# Patient Record
Sex: Female | Born: 2000 | Race: White | Hispanic: No | Marital: Single | Smoking: Current some day smoker
Health system: Southern US, Community
[De-identification: ages and names within clinical notes are randomized; demographics above are authoritative.]

## PROBLEM LIST (undated history)

## (undated) DIAGNOSIS — F419 Anxiety disorder, unspecified: Secondary | ICD-10-CM

## (undated) DIAGNOSIS — J45909 Unspecified asthma, uncomplicated: Secondary | ICD-10-CM

## (undated) DIAGNOSIS — I639 Cerebral infarction, unspecified: Secondary | ICD-10-CM

## (undated) HISTORY — DX: Cerebral infarction, unspecified: I63.9

---
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
## 2021-04-11 ENCOUNTER — Emergency Department (HOSPITAL_BASED_OUTPATIENT_CLINIC_OR_DEPARTMENT_OTHER): Payer: BC Managed Care – PPO

## 2021-04-11 ENCOUNTER — Encounter (HOSPITAL_BASED_OUTPATIENT_CLINIC_OR_DEPARTMENT_OTHER): Payer: Self-pay | Admitting: Emergency Medicine

## 2021-04-11 ENCOUNTER — Other Ambulatory Visit: Payer: Self-pay

## 2021-04-11 ENCOUNTER — Inpatient Hospital Stay (HOSPITAL_BASED_OUTPATIENT_CLINIC_OR_DEPARTMENT_OTHER)
Admission: EM | Admit: 2021-04-11 | Discharge: 2021-04-14 | DRG: 065 | Disposition: A | Discharge status: Home / Self Care | Payer: BC Managed Care – PPO | Attending: Internal Medicine | Admitting: Internal Medicine

## 2021-04-11 DIAGNOSIS — I639 Cerebral infarction, unspecified: Secondary | ICD-10-CM | POA: Diagnosis present

## 2021-04-11 DIAGNOSIS — Z793 Long term (current) use of hormonal contraceptives: Secondary | ICD-10-CM | POA: Diagnosis not present

## 2021-04-11 DIAGNOSIS — Z8673 Personal history of transient ischemic attack (TIA), and cerebral infarction without residual deficits: Secondary | ICD-10-CM

## 2021-04-11 DIAGNOSIS — R471 Dysarthria and anarthria: Secondary | ICD-10-CM | POA: Diagnosis present

## 2021-04-11 DIAGNOSIS — R413 Other amnesia: Secondary | ICD-10-CM

## 2021-04-11 DIAGNOSIS — J302 Other seasonal allergic rhinitis: Secondary | ICD-10-CM | POA: Diagnosis not present

## 2021-04-11 DIAGNOSIS — Z20822 Contact with and (suspected) exposure to covid-19: Secondary | ICD-10-CM | POA: Diagnosis not present

## 2021-04-11 DIAGNOSIS — E538 Deficiency of other specified B group vitamins: Secondary | ICD-10-CM | POA: Diagnosis present

## 2021-04-11 DIAGNOSIS — I6340 Cerebral infarction due to embolism of unspecified cerebral artery: Secondary | ICD-10-CM | POA: Diagnosis not present

## 2021-04-11 DIAGNOSIS — Y838 Other surgical procedures as the cause of abnormal reaction of the patient, or of later complication, without mention of misadventure at the time of the procedure: Secondary | ICD-10-CM | POA: Diagnosis not present

## 2021-04-11 DIAGNOSIS — R29701 NIHSS score 1: Secondary | ICD-10-CM | POA: Diagnosis not present

## 2021-04-11 DIAGNOSIS — J4520 Mild intermittent asthma, uncomplicated: Secondary | ICD-10-CM | POA: Diagnosis present

## 2021-04-11 DIAGNOSIS — Q278 Other specified congenital malformations of peripheral vascular system: Secondary | ICD-10-CM

## 2021-04-11 DIAGNOSIS — N926 Irregular menstruation, unspecified: Secondary | ICD-10-CM | POA: Diagnosis present

## 2021-04-11 DIAGNOSIS — Z716 Tobacco abuse counseling: Secondary | ICD-10-CM | POA: Diagnosis not present

## 2021-04-11 DIAGNOSIS — L7632 Postprocedural hematoma of skin and subcutaneous tissue following other procedure: Secondary | ICD-10-CM | POA: Diagnosis not present

## 2021-04-11 DIAGNOSIS — Z8616 Personal history of COVID-19: Secondary | ICD-10-CM | POA: Diagnosis not present

## 2021-04-11 DIAGNOSIS — I10 Essential (primary) hypertension: Secondary | ICD-10-CM | POA: Diagnosis present

## 2021-04-11 DIAGNOSIS — F17210 Nicotine dependence, cigarettes, uncomplicated: Secondary | ICD-10-CM | POA: Diagnosis not present

## 2021-04-11 DIAGNOSIS — R4701 Aphasia: Secondary | ICD-10-CM | POA: Diagnosis present

## 2021-04-11 DIAGNOSIS — F1290 Cannabis use, unspecified, uncomplicated: Secondary | ICD-10-CM | POA: Diagnosis not present

## 2021-04-11 DIAGNOSIS — F419 Anxiety disorder, unspecified: Secondary | ICD-10-CM | POA: Diagnosis present

## 2021-04-11 DIAGNOSIS — Z7151 Drug abuse counseling and surveillance of drug abuser: Secondary | ICD-10-CM

## 2021-04-11 DIAGNOSIS — E785 Hyperlipidemia, unspecified: Secondary | ICD-10-CM | POA: Diagnosis present

## 2021-04-11 DIAGNOSIS — R479 Unspecified speech disturbances: Secondary | ICD-10-CM

## 2021-04-11 HISTORY — DX: Anxiety disorder, unspecified: F41.9

## 2021-04-11 HISTORY — DX: Unspecified asthma, uncomplicated: J45.909

## 2021-04-11 LAB — PREGNANCY, URINE: Preg Test, Ur: NEGATIVE

## 2021-04-11 LAB — COMPREHENSIVE METABOLIC PANEL
ALT: 9 U/L (ref 0–44)
AST: 12 U/L — ABNORMAL LOW (ref 15–41)
Albumin: 4.4 g/dL (ref 3.5–5.0)
Alkaline Phosphatase: 45 U/L (ref 38–126)
Anion gap: 8 (ref 5–15)
BUN: 10 mg/dL (ref 6–20)
CO2: 24 mmol/L (ref 22–32)
Calcium: 9.7 mg/dL (ref 8.9–10.3)
Chloride: 105 mmol/L (ref 98–111)
Creatinine, Ser: 0.79 mg/dL (ref 0.44–1.00)
GFR, Estimated: >60 mL/min (ref >60)
Glucose, Bld: 87 mg/dL (ref 70–99)
Potassium: 4 mmol/L (ref 3.5–5.1)
Sodium: 137 mmol/L (ref 135–145)
Total Bilirubin: 0.4 mg/dL (ref 0.3–1.2)
Total Protein: 7.2 g/dL (ref 6.5–8.1)

## 2021-04-11 LAB — DIFFERENTIAL
Abs Immature Granulocytes: 0 10*3/uL (ref 0.00–0.07)
Basophils Absolute: 0 10*3/uL (ref 0.0–0.1)
Basophils Relative: 1 %
Eosinophils Absolute: 0.1 10*3/uL (ref 0.0–0.5)
Eosinophils Relative: 2 %
Immature Granulocytes: 0 %
Lymphocytes Relative: 37 %
Lymphs Abs: 1.8 10*3/uL (ref 0.7–4.0)
Monocytes Absolute: 0.5 10*3/uL (ref 0.1–1.0)
Monocytes Relative: 10 %
Neutro Abs: 2.4 10*3/uL (ref 1.7–7.7)
Neutrophils Relative %: 50 %

## 2021-04-11 LAB — CBC
HCT: 38.7 % (ref 36.0–46.0)
Hemoglobin: 13 g/dL (ref 12.0–15.0)
MCH: 27.5 pg (ref 26.0–34.0)
MCHC: 33.6 g/dL (ref 30.0–36.0)
MCV: 81.8 fL (ref 80.0–100.0)
Platelets: 266 10*3/uL (ref 150–400)
RBC: 4.73 MIL/uL (ref 3.87–5.11)
RDW: 12.2 % (ref 11.5–15.5)
WBC: 4.8 10*3/uL (ref 4.0–10.5)
nRBC: 0 % (ref 0.0–0.2)

## 2021-04-11 LAB — RAPID URINE DRUG SCREEN, HOSP PERFORMED
Amphetamines: NOT DETECTED
Barbiturates: NOT DETECTED
Benzodiazepines: NOT DETECTED
Cocaine: NOT DETECTED
Opiates: NOT DETECTED
Tetrahydrocannabinol: POSITIVE — AB

## 2021-04-11 LAB — URINALYSIS, ROUTINE W REFLEX MICROSCOPIC
Bilirubin Urine: NEGATIVE
Glucose, UA: NEGATIVE mg/dL
Ketones, ur: NEGATIVE mg/dL
Leukocytes,Ua: NEGATIVE
Nitrite: NEGATIVE
Protein, ur: NEGATIVE mg/dL
Specific Gravity, Urine: 1.009 (ref 1.005–1.030)
pH: 7.5 (ref 5.0–8.0)

## 2021-04-11 LAB — RESP PANEL BY RT-PCR (FLU A&B, COVID) ARPGX2
Influenza A by PCR: NEGATIVE
Influenza B by PCR: NEGATIVE
SARS Coronavirus 2 by RT PCR: NEGATIVE

## 2021-04-11 LAB — CBG MONITORING, ED: Glucose-Capillary: 89 mg/dL (ref 70–99)

## 2021-04-11 LAB — ETHANOL: Alcohol, Ethyl (B): <10 mg/dL (ref <10)

## 2021-04-11 LAB — PROTIME-INR
INR: 1.1 (ref 0.8–1.2)
Prothrombin Time: 14 seconds (ref 11.4–15.2)

## 2021-04-11 LAB — APTT: aPTT: 26 seconds (ref 24–36)

## 2021-04-11 MED ORDER — ACETAMINOPHEN 160 MG/5ML PO SOLN: 650.0000 mg | ORAL | Status: DC | PRN

## 2021-04-11 MED ORDER — SODIUM CHLORIDE 0.9 % IV SOLN: INTRAVENOUS | Status: DC

## 2021-04-11 MED ORDER — ACETAMINOPHEN 650 MG RE SUPP: 650.0000 mg | RECTAL | Status: DC | PRN

## 2021-04-11 MED ORDER — SODIUM CHLORIDE 0.9 % IV BOLUS
1000.0000 mL | Freq: Once | INTRAVENOUS | Status: AC
  Administered 2021-04-11: 1000 mL via INTRAVENOUS

## 2021-04-11 MED ORDER — ACETAMINOPHEN 325 MG PO TABS
650.0000 mg | ORAL_TABLET | ORAL | Status: DC | PRN
  Administered 2021-04-11 – 2021-04-13 (×2): 650 mg via ORAL
  Filled 2021-04-11 (×2): qty 2

## 2021-04-11 MED ORDER — STROKE: EARLY STAGES OF RECOVERY BOOK
Freq: Once | Status: AC
  Filled 2021-04-11: qty 1

## 2021-04-11 NOTE — ED Provider Notes (Addendum)
[facility] EMERGENCY DEPT Provider Note   CSN: [id number] Arrival date & time: 04/11/21  1343     History Chief Complaint  Patient presents with  . Headache    Meghan Williamson is a 20 y.o. female.  HPI 20 year old female presents with trouble speaking and some headache.  History is from patient as well as grandparents.  Started having a headache around 1 AM on the left side of her head.  Is hard to tell based on the patient's difficulty with speaking how bad this was but it seems to be better.  She worked a shift last night at [employer] and it seems like she did not have any trouble speaking then.  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  Sister noticed she was having trouble talking.  Called her parents were out of town and they advised to give ibuprofen.  When she woke up this morning she still having difficulty talking.  Certain words seem to get caught and hard for her to say.  No fevers.  No significant past medical history.  History reviewed. No pertinent past medical history.  There are no problems to display for this patient.      OB History   No obstetric history on file.     History reviewed. No pertinent family history.     Home Medications Prior to Admission medications   Not on File    Allergies    Patient has no known allergies.  Review of Systems   Review of Systems  Constitutional: Negative for fever.  Gastrointestinal: Negative for vomiting.  Neurological: Positive for speech difficulty and headaches. Negative for weakness and numbness.  Psychiatric/Behavioral: The patient is nervous/anxious.   All other systems reviewed and are negative.   Physical Exam Updated Vital Signs BP (!) 156/97 (BP Location: Right Arm)   Pulse 63   Temp 98.4 F (36.9 C) (Oral)   Resp (!) 23   Ht 5\' 7"  (1.702 m)   Wt 58.1 kg   SpO2 100%   BMI 20.05 kg/m   Physical Exam Vitals and  nursing note reviewed.  Constitutional:      Appearance: She is well-developed.  HENT:     Head: Normocephalic and atraumatic.     Right Ear: External ear normal.     Left Ear: External ear normal.     Nose: Nose normal.  Eyes:     General:        Right eye: No discharge.        Left eye: No discharge.     Extraocular Movements: Extraocular movements intact.     Pupils: Pupils are equal, round, and reactive to light.  Cardiovascular:     Rate and Rhythm: Normal rate and regular rhythm.     Heart sounds: Normal heart sounds.  Pulmonary:     Effort: Pulmonary effort is normal.     Breath sounds: Normal breath sounds.  Abdominal:     General: There is no distension.     Palpations: Abdomen is soft.     Tenderness: There is no abdominal tenderness.  Musculoskeletal:     Cervical back: Neck supple. No rigidity.  Skin:    General: Skin is warm and dry.  Neurological:     Mental Status: She is alert.     Comments: CN 3-12 grossly intact. 5/5 strength in all 4 extremities. Grossly normal sensation. Normal finger to nose.  Speech is clear, but she has trouble when she talks where she will repeat a word or misplace a word. She is able to identify objects without difficulty. When asked where she is, she keeps stumbling over "[facility]" (presumably for [facility]) and eventually starts to get tearful  Psychiatric:        Mood and Affect: Mood is anxious (mildly anxious).     ED Results / Procedures / Treatments   Labs (all labs ordered are listed, but only abnormal results are displayed) Labs Reviewed  PROTIME-INR  APTT  CBC  DIFFERENTIAL  ETHANOL  COMPREHENSIVE METABOLIC PANEL  RAPID URINE DRUG SCREEN, HOSP PERFORMED  URINALYSIS, ROUTINE W REFLEX MICROSCOPIC  PREGNANCY, URINE  CBG MONITORING, ED    EKG EKG Interpretation  Date/Time:  Sunday Apr 11 2021 14:16:08 EDT Ventricular Rate:  62 PR Interval:  118 QRS Duration: 80 QT Interval:  367 QTC Calculation: 373 R  Axis:   65 Text Interpretation: Sinus rhythm Borderline short PR interval Probable left ventricular hypertrophy Borderline T abnormalities, inferior leads No old tracing to compare Confirmed by Pricilla Loveless [phone]) on 04/11/2021 2:17:52 PM   Radiology CT HEAD WO CONTRAST  Result Date: 04/11/2021 CLINICAL DATA:  Pt arrives to ED with c/o an expressive aphasic episode today starting at 1pm where she could not speak words. Pt states this episode may of lasted a few minutes. Pt reports she started a new job and it requires he to talk to a lot of new people. Pt does reports headache before the event and headache yesterday after twist her neck and hearing a "poping" noise EXAM: CT HEAD WITHOUT CONTRAST TECHNIQUE: Contiguous axial images were obtained from the base of the skull through the vertex without intravenous contrast. COMPARISON:  None. FINDINGS: Brain: No evidence of acute infarction, hemorrhage, hydrocephalus, extra-axial collection or mass lesion/mass effect. Vascular: No hyperdense vessel or unexpected calcification. Skull: Normal. Negative for fracture or focal lesion. Sinuses/Orbits: Visualized globes and orbits are unremarkable. Visualized sinuses are clear. Other: None. IMPRESSION: Normal unenhanced CT scan of the brain. Electronically Signed   By: Amie Portland M.D.   On: 04/11/2021 14:49    Procedures Procedures   Medications Ordered in ED Medications  sodium chloride 0.9 % bolus 1,000 mL (1,000 mLs Intravenous New Bag/Given 04/11/21 1443)    ED Course  I have reviewed the triage vital signs and the nursing notes.  Pertinent labs & imaging results that were available during my care of the patient were reviewed by me and considered in my medical decision making (see chart for details).    MDM Rules/Calculators/A&P                          Patient continues to have trouble speaking.  I do not think this is likely a stroke but so far no other obvious pathology has been found.   There could be an anxiety component/functional symptoms.  I discussed with Dr. Selina Cooley of neurology who recommends that she be transferred to [facility] for admission where she will get MRI brain, MRA head and neck and a full stroke work-up.  Discussed with parents and will admit for observation for this work-up.  She is still having difficulty speaking. At this point I don't have a strong suspicion for CNS infection and don't think LP is needed. Final Clinical Impression(s) / ED Diagnoses Final diagnoses:  Difficulty with speech    Rx / DC Orders ED Discharge Orders  None       Pricilla Loveless, MD 04/11/21 1523    Pricilla Loveless, MD 04/11/21 [phone]

## 2021-04-11 NOTE — ED Notes (Signed)
This RN attempted to call report at this time; no available RN at this time. This RN will re-attempt at a later time. Call Back Number left to [phone]

## 2021-04-11 NOTE — H&P (Signed)
History and Physical    Meghan Williamson ANV:[id number] DOB: 2000-12-09 DOA: 04/11/2021  PCP: Pcp, No   Patient coming from:  Home  Chief Complaint: difficulty speaking since last pm. Memory difficulties.   HPI: Meghan Williamson is a 20 y.o. female with medical history of intermittent seasonal allergies inducing asthma with no respiratory problems or symptoms. Presented to [facility] ER for evaluation of difficulty speaking and memory problems that began last pm. She states she was driving home from work around 1 AM when she developed a left-sided headache that she reports was throbbing but did not radiate.  She had no visual changes or auras with the headache.  She has no history of migraine headaches.  After she arrived home she had difficulty speaking.  She states that when she tried to talk to her sister she had difficulty finding the words to say and when she did speak the words were jumbled and did not make sense.  She tried texting her mother who was on vacation at the beach at the time and she could only type out jumbled letters that did not make coherent sense.  And has some memory lapses and difficulty remembering things.  Parents came home from the beach this morning.  She was taken to the emergency room for evaluation.  There is no report of any drooping face or difficulty walking or imbalance.  There is no report of any seizure-like activity or tremors and jerking of extremities.  He had no fever, nausea vomiting or diarrhea.  There is no history of seizures or migraine headaches in the family.  There is no known family history of clotting disorders. She is on oral contraceptives to regulate her menstrual period.  She has been under a lot of stress recently.  She just finished her first year of college at [employer] and returned home for the summer recently.  She is working at [employer], [employer], as a Psychologist, educational.  She has never had symptoms like this in the past. Does state that she  occasionally uses vaping products or smoke a cigarette.  She does report occasional marijuana use with her last use last night.  She denies any heroin, cocaine, LSD, PCP, ecstasy.  She denies alcohol use.  ED Course: In the emergency room patient been hemodynamically stable.  Labs are unremarkable.  CT of the head was negative.  Case was discussed with neurology by the ER physician and neurology recommended transfer to [facility] for stroke work-up with MRI of the brain, MRA of the head and neck and echocardiogram.  Review of Systems:  General: Denies weakness, fever, chills, weight loss, night sweats.  Denies dizziness.  Denies change in appetite HENT: Denies head trauma, headache, denies change in hearing, tinnitus. Denies nasal bleeding.  Denies sore throat, sores in mouth. Denies difficulty swallowing Eyes: Denies blurry vision, pain in eye, drainage.  Denies discoloration of eyes. Neck: Denies pain.  Denies swelling.  Denies pain with movement. Cardiovascular: Denies chest pain, palpitations. Denies edema.  Denies orthopnea Respiratory: Denies shortness of breath, cough. Denies wheezing.  Denies sputum production Gastrointestinal: Denies abdominal pain, swelling. Denies nausea, vomiting, diarrhea.  Denies melena.  Denies hematemesis. Musculoskeletal: Denies limitation of movement. Denies deformity or swelling. Denies pain. Denies arthralgias or myalgias. Genitourinary: Denies pelvic pain. Denies urinary frequency or hesitancy. Denies dysuria.  Skin: Denies rash.  Denies petechiae, purpura, ecchymosis. Neurological: Reports difficulty with speech and finding right words to day. Reports headache last night that is resolved.  Denies syncope.  Denies seizure activity. Denies weakness or paresthesia. Denies drooping face.  Denies visual change. Psychiatric: Denies depression, anxiety. Denies hallucinations.  Past Medical History:  Diagnosis Date  . Anxiety   . Asthma     History reviewed. No  pertinent surgical history.  Social History  reports that she has been smoking. She has never used smokeless tobacco. She reports current drug use. Drug: Marijuana. She reports that she does not drink alcohol.  No Known Allergies  History reviewed. No pertinent family history.   Prior to Admission medications   Not on File    Physical Exam: Vitals:   04/11/21 1445 04/11/21 1509 04/11/21 1917 04/11/21 2106  BP:  (!) 156/97 (!) 136/91 (!) 140/95  Pulse: 61 63 61 78  Resp: (!) 21 (!) 23 19 16   Temp:    98.8 F (37.1 C)  TempSrc:    Oral  SpO2: 100% 100% 100% 100%  Weight:      Height:        Constitutional: NAD, calm, comfortable Vitals:   04/11/21 1445 04/11/21 1509 04/11/21 1917 04/11/21 2106  BP:  (!) 156/97 (!) 136/91 (!) 140/95  Pulse: 61 63 61 78  Resp: (!) 21 (!) 23 19 16   Temp:    98.8 F (37.1 C)  TempSrc:    Oral  SpO2: 100% 100% 100% 100%  Weight:      Height:       General: WDWN, Alert and oriented x3.  Eyes: EOMI, PERRL, conjunctivae normal.  Sclera nonicteric HENT:  [place]/AT, external ears normal.  Nares patent without epistasis.  Mucous membranes are moist. Posterior pharynx clear of any exudate or lesions. Normal dentition.  Neck: Soft, normal range of motion, supple, no masses, no thyromegaly.  Trachea midline Respiratory: clear to auscultation bilaterally, no wheezing, no crackles. Normal respiratory effort. No accessory muscle use.  Cardiovascular: Regular rate and rhythm, no murmurs / rubs / gallops. No extremity edema. 2+ pedal pulses.   Abdomen: Soft, no tenderness, nondistended, no rebound or guarding.  No masses palpated. No hepatosplenomegaly. Bowel sounds normoactive Musculoskeletal: FROM. no cyanosis. No joint deformity upper and lower extremities. Normal muscle tone.  Skin: Warm, dry, intact no rashes, lesions, ulcers. No induration Neurologic: CN 2-12 grossly intact.  Normal speech.  Sensation intact, patella DTR +1 bilaterally. Strength 5/5 in  all extremities. No pronator drift. Normal Heel to shin bilaterally, normal finger to nose bilaterally.  Psychiatric: Normal judgment and insight.  Normal mood.    Labs on Admission: I have personally reviewed following labs and imaging studies  CBC: Recent Labs  Lab 04/11/21 1444  WBC 4.8  NEUTROABS 2.4  HGB 13.0  HCT 38.7  MCV 81.8  PLT 266    Basic Metabolic Panel: Recent Labs  Lab 04/11/21 1444  NA 137  K 4.0  CL 105  CO2 24  GLUCOSE 87  BUN 10  CREATININE 0.79  CALCIUM 9.7    GFR: Estimated Creatinine Clearance: 104.6 mL/min (by C-G formula based on SCr of 0.79 mg/dL).  Liver Function Tests: Recent Labs  Lab 04/11/21 1444  AST 12*  ALT 9  ALKPHOS 45  BILITOT 0.4  PROT 7.2  ALBUMIN 4.4    Urine analysis:    Component Value Date/Time   COLORURINE COLORLESS (A) 04/11/2021 1444   APPEARANCEUR CLEAR 04/11/2021 1444   LABSPEC 1.009 04/11/2021 1444   PHURINE 7.5 04/11/2021 1444   GLUCOSEU NEGATIVE 04/11/2021 1444   HGBUR MODERATE (A) 04/11/2021 1444  BILIRUBINUR NEGATIVE 04/11/2021 1444   KETONESUR NEGATIVE 04/11/2021 1444   PROTEINUR NEGATIVE 04/11/2021 1444   NITRITE NEGATIVE 04/11/2021 1444   LEUKOCYTESUR NEGATIVE 04/11/2021 1444    Radiological Exams on Admission: CT HEAD WO CONTRAST  Result Date: 04/11/2021 CLINICAL DATA:  Pt arrives to ED with c/o an expressive aphasic episode today starting at 1pm where she could not speak words. Pt states this episode may of lasted a few minutes. Pt reports she started a new job and it requires he to talk to a lot of new people. Pt does reports headache before the event and headache yesterday after twist her neck and hearing a "poping" noise EXAM: CT HEAD WITHOUT CONTRAST TECHNIQUE: Contiguous axial images were obtained from the base of the skull through the vertex without intravenous contrast. COMPARISON:  None. FINDINGS: Brain: No evidence of acute infarction, hemorrhage, hydrocephalus, extra-axial collection  or mass lesion/mass effect. Vascular: No hyperdense vessel or unexpected calcification. Skull: Normal. Negative for fracture or focal lesion. Sinuses/Orbits: Visualized globes and orbits are unremarkable. Visualized sinuses are clear. Other: None. IMPRESSION: Normal unenhanced CT scan of the brain. Electronically Signed   By: Amie Portland M.D.   On: 04/11/2021 14:49    EKG: Independently reviewed. EKG shows NSR with no acute ST elevation or depression QTc is 373  Assessment/Plan Principal Problem:   Dysarthria Ms. Stanczyk will be observed on medical telemetry floor for CVA.  Will obtain MRI of brain to rule out acute ischemic CVA. Obtain MRA head and neck per neurology recommendation.  Obtain echocardiogram to evaluate for PFO, wall motion and ejection fraction. Hypertension of 220/110 will be allowed for 24 hours per stroke protocol. Monitor BP Antiplatelet therapy with aspirin daily. Check lipid panel.  Neurochecks per stroke protocol  Active Problems:   Memory difficulties Obtain MRI. If symptoms persist or MRI positive will have neurology evaluate patient and possibly will need EEG to rule out petit mal seizure activity.     DVT prophylaxis: Padua score low. Early ambulation for DVT prophylaxis.  Code Status:   Full code  Family Communication:  Diagnosis and plan discussed with Ms. Devall and her mother who is at bedside. Questions answered. They agree with plan. Further recommendations to follow as clinically indicated. Disposition Plan:   Patient is from:  Home  Anticipated DC to:  home  Anticipated DC date:  Anticipate less than two midinight stay  Anticipated DC barriers:          No barriers to discharge identified at this time  Consults:  Case discussed with Neurology by ER physician who recommended stroke workup at [facility] Admission status:  Observation   Carlton Adam MD [facility]  How to contact the [facility] Attending or Consulting provider 7A - 7P or covering  provider during after hours 7P -7A, for this patient?   1. Check the care team in [facility] and look for a) attending/consulting TR[facility] provider listed and b) the [facility] team listed 2. Log into [email or web address] and use [facility] universal password to access. If you do not have the password, please contact the hospital operator. 3. Locate the [facility] provider you are looking for under [facility] and page to a number that you can be directly reached. 4. If you still have difficulty reaching the provider, please page the [facility] (Director on Call) for the Hospitalists listed on amion for assistance.  04/11/2021, 9:56 PM

## 2021-04-11 NOTE — ED Notes (Signed)
Patient transported to X-ray 

## 2021-04-11 NOTE — ED Notes (Signed)
[facility] at the Bedside; all Questions answered. Patient signed Transfer Consent Form Willingly. Report called by this RN to [place], RN

## 2021-04-11 NOTE — ED Triage Notes (Addendum)
Pt arrives to ED with c/o an expressive aphasic episode today starting at 1am where she could not speak words. Pt states this episode may of lasted a few minutes. Pt reports she started a new job and it requires her to talk to a lot of new people. Pt does reports headache before the event and headache yesterday after twist her neck and hearing a "poping" noise.

## 2021-04-12 ENCOUNTER — Observation Stay (HOSPITAL_COMMUNITY): Payer: BC Managed Care – PPO

## 2021-04-12 ENCOUNTER — Other Ambulatory Visit (HOSPITAL_COMMUNITY): Payer: PRIVATE HEALTH INSURANCE

## 2021-04-12 DIAGNOSIS — E785 Hyperlipidemia, unspecified: Secondary | ICD-10-CM | POA: Diagnosis present

## 2021-04-12 DIAGNOSIS — R471 Dysarthria and anarthria: Secondary | ICD-10-CM | POA: Diagnosis present

## 2021-04-12 DIAGNOSIS — F1290 Cannabis use, unspecified, uncomplicated: Secondary | ICD-10-CM | POA: Diagnosis present

## 2021-04-12 DIAGNOSIS — F17210 Nicotine dependence, cigarettes, uncomplicated: Secondary | ICD-10-CM | POA: Diagnosis present

## 2021-04-12 DIAGNOSIS — R413 Other amnesia: Secondary | ICD-10-CM | POA: Diagnosis not present

## 2021-04-12 DIAGNOSIS — Z716 Tobacco abuse counseling: Secondary | ICD-10-CM | POA: Diagnosis not present

## 2021-04-12 DIAGNOSIS — I6340 Cerebral infarction due to embolism of unspecified cerebral artery: Secondary | ICD-10-CM | POA: Diagnosis present

## 2021-04-12 DIAGNOSIS — Y838 Other surgical procedures as the cause of abnormal reaction of the patient, or of later complication, without mention of misadventure at the time of the procedure: Secondary | ICD-10-CM | POA: Diagnosis not present

## 2021-04-12 DIAGNOSIS — I639 Cerebral infarction, unspecified: Secondary | ICD-10-CM | POA: Diagnosis not present

## 2021-04-12 DIAGNOSIS — Q278 Other specified congenital malformations of peripheral vascular system: Secondary | ICD-10-CM | POA: Diagnosis not present

## 2021-04-12 DIAGNOSIS — J302 Other seasonal allergic rhinitis: Secondary | ICD-10-CM | POA: Diagnosis present

## 2021-04-12 DIAGNOSIS — E538 Deficiency of other specified B group vitamins: Secondary | ICD-10-CM | POA: Diagnosis present

## 2021-04-12 DIAGNOSIS — I6389 Other cerebral infarction: Secondary | ICD-10-CM | POA: Diagnosis not present

## 2021-04-12 DIAGNOSIS — Z7151 Drug abuse counseling and surveillance of drug abuser: Secondary | ICD-10-CM | POA: Diagnosis not present

## 2021-04-12 DIAGNOSIS — Z8616 Personal history of COVID-19: Secondary | ICD-10-CM | POA: Diagnosis not present

## 2021-04-12 DIAGNOSIS — Z20822 Contact with and (suspected) exposure to covid-19: Secondary | ICD-10-CM | POA: Diagnosis present

## 2021-04-12 DIAGNOSIS — R4701 Aphasia: Secondary | ICD-10-CM | POA: Diagnosis present

## 2021-04-12 DIAGNOSIS — J4520 Mild intermittent asthma, uncomplicated: Secondary | ICD-10-CM | POA: Diagnosis present

## 2021-04-12 DIAGNOSIS — R29701 NIHSS score 1: Secondary | ICD-10-CM | POA: Diagnosis present

## 2021-04-12 DIAGNOSIS — F419 Anxiety disorder, unspecified: Secondary | ICD-10-CM | POA: Diagnosis present

## 2021-04-12 DIAGNOSIS — I351 Nonrheumatic aortic (valve) insufficiency: Secondary | ICD-10-CM | POA: Diagnosis not present

## 2021-04-12 DIAGNOSIS — L7632 Postprocedural hematoma of skin and subcutaneous tissue following other procedure: Secondary | ICD-10-CM | POA: Diagnosis not present

## 2021-04-12 DIAGNOSIS — Z793 Long term (current) use of hormonal contraceptives: Secondary | ICD-10-CM | POA: Diagnosis not present

## 2021-04-12 DIAGNOSIS — I10 Essential (primary) hypertension: Secondary | ICD-10-CM | POA: Diagnosis present

## 2021-04-12 DIAGNOSIS — Z8673 Personal history of transient ischemic attack (TIA), and cerebral infarction without residual deficits: Secondary | ICD-10-CM | POA: Diagnosis not present

## 2021-04-12 DIAGNOSIS — N926 Irregular menstruation, unspecified: Secondary | ICD-10-CM | POA: Diagnosis present

## 2021-04-12 LAB — CSF CELL COUNT WITH DIFFERENTIAL
RBC Count, CSF: 2 /mm3 — ABNORMAL HIGH
RBC Count, CSF: 2 /mm3 — ABNORMAL HIGH
Tube #: 1
Tube #: 4
WBC, CSF: 0 /mm3 (ref 0–5)
WBC, CSF: 1 /mm3 (ref 0–5)

## 2021-04-12 LAB — PROTEIN AND GLUCOSE, CSF
Glucose, CSF: 57 mg/dL (ref 40–70)
Total  Protein, CSF: 21 mg/dL (ref 15–45)

## 2021-04-12 LAB — HIV ANTIBODY (ROUTINE TESTING W REFLEX): HIV Screen 4th Generation wRfx: NONREACTIVE

## 2021-04-12 LAB — LIPID PANEL
Cholesterol: 151 mg/dL (ref 0–169)
HDL: 40 mg/dL — ABNORMAL LOW (ref >40)
LDL Cholesterol: 89 mg/dL (ref 0–99)
Total CHOL/HDL Ratio: 3.8 RATIO
Triglycerides: 110 mg/dL (ref <150)
VLDL: 22 mg/dL (ref 0–40)

## 2021-04-12 LAB — ANTITHROMBIN III: AntiThromb III Func: 92 % (ref 75–120)

## 2021-04-12 LAB — GLUCOSE, CAPILLARY: Glucose-Capillary: 105 mg/dL — ABNORMAL HIGH (ref 70–99)

## 2021-04-12 LAB — TSH: TSH: 1.208 u[IU]/mL (ref 0.350–4.500)

## 2021-04-12 MED ORDER — ASPIRIN 325 MG PO TABS
325.0000 mg | ORAL_TABLET | Freq: Once | ORAL | Status: AC
  Administered 2021-04-12: 325 mg via ORAL
  Filled 2021-04-12: qty 1

## 2021-04-12 MED ORDER — ASPIRIN EC 81 MG PO TBEC
81.0000 mg | DELAYED_RELEASE_TABLET | Freq: Every day | ORAL | Status: DC
  Administered 2021-04-13 – 2021-04-14 (×2): 81 mg via ORAL
  Filled 2021-04-12 (×2): qty 1

## 2021-04-12 MED ORDER — GADOBUTROL 1 MMOL/ML IV SOLN
6.0000 mL | Freq: Once | INTRAVENOUS | Status: AC | PRN
  Administered 2021-04-12: 6 mL via INTRAVENOUS

## 2021-04-12 MED ORDER — LORAZEPAM 2 MG/ML IJ SOLN
1.0000 mg | Freq: Once | INTRAMUSCULAR | Status: AC
  Administered 2021-04-12: 1 mg via INTRAVENOUS
  Filled 2021-04-12: qty 1

## 2021-04-12 MED ORDER — LIDOCAINE HCL (PF) 1 % IJ SOLN
INTRAMUSCULAR | Status: AC
  Administered 2021-04-12: 5 mL via INTRADERMAL
  Filled 2021-04-12: qty 5

## 2021-04-12 MED ORDER — LIDOCAINE HCL (PF) 1 % IJ SOLN: 5.0000 mL | Freq: Once | INTRAMUSCULAR | Status: AC

## 2021-04-12 MED ORDER — ATORVASTATIN CALCIUM 40 MG PO TABS
40.0000 mg | ORAL_TABLET | Freq: Every day | ORAL | Status: DC
  Administered 2021-04-12: 40 mg via ORAL
  Filled 2021-04-12: qty 1

## 2021-04-12 MED ORDER — ATORVASTATIN CALCIUM 40 MG PO TABS: 40.0000 mg | ORAL_TABLET | Freq: Every day | ORAL | Status: DC

## 2021-04-12 NOTE — Procedures (Signed)
Patient Name: Meghan Williamson  MRN: [id number]  Epilepsy Attending: Charlsie Quest  Referring Physician/Provider: Dr Brooke Dare Date: 04/12/2021 Duration: 27.42 mins  Patient history: 20 year old female with no significant medical history pertaining to strokes presented for evaluation of expressive aphasia and was found to have left temporal, occipital, parietal region acute ischemic infarcts.   EEG to evaluate for seizures.  Level of alertness: Awake  AEDs during EEG study: None  Technical aspects: This EEG study was done with scalp electrodes positioned according to the 10-20 International system of electrode placement. Electrical activity was acquired at a sampling rate of 500Hz  and reviewed with a high frequency filter of 70Hz  and a low frequency filter of 1Hz . EEG data were recorded continuously and digitally stored.   Description: The posterior dominant rhythm consists of 9-10 Hz activity of moderate voltage (25-35 uV) seen predominantly in posterior head regions, symmetric and reactive to eye opening and eye closing. EEG showed continuous 3 to 6 Hz theta-delta slowing in left hemisphere, maximal left temporal region which at times appears rhythmic. Hyperventilation and photic stimulation were not performed.    ABNORMALITY -Continuous slow, left hemisphere, maximal left temporal region  IMPRESSION: This study is suggestive of cortical dysfunction left hemisphere, maximal left temporal region likely secondary to underlying stroke.  No seizures or definite epileptiform discharges were seen throughout the recording.  Meghan Williamson 

## 2021-04-12 NOTE — Progress Notes (Signed)
EEG complete - results pending 

## 2021-04-12 NOTE — Evaluation (Signed)
Speech Language Pathology Evaluation Patient Details Name: Meghan Williamson MRN: [id number] DOB: Nov 02, 2001 Today's Date: 04/12/2021 Time: 2774-1287 SLP Time Calculation (min) (ACUTE ONLY): 25 min  Problem List:  Patient Active Problem List   Diagnosis Date Noted  . Acute CVA (cerebrovascular accident) (HCC) 04/12/2021  . Dysarthria 04/11/2021  . Memory difficulties 04/11/2021   Past Medical History:  Past Medical History:  Diagnosis Date  . Anxiety   . Asthma    Past Surgical History: History reviewed. No pertinent surgical history. HPI:  Patient is an 20 y.o. female with PMH: anxiety, intermittent seasonal allergies inducing asthma with no respiratory problems or symptoms. She presented to ER for evaluation of difficulty speaking and memory problems that began 5/28. Patient reported she was driving home from work at [employer] when she developed a left sided headache that she reports was throbbing but did not radiate. She she arrived home she had difficulty speaking and when she tried texting her Mom whas was on vacation at the beach at the time, she could only type out jumbled letters that did not make coherent sense. Patient has also reported some memory lapses and difficulty remembering things. 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  CT head was negative but MRI brain revealed acute ischemic cortical infarct involving left temporal occipital and parietal region possible minimal petechial hemorrhage without hemorrhagic transformation or mass-effect.  Multifocal acute ischemic infarcts involving the right frontal lobe, right basal ganglia, right temporal occipital region and cerebellum.  Underlying remote lacunar infarcts involving left thalamus and cerebellum.   Assessment / Plan / Recommendation Clinical Impression  Patient presents with mild-mod  expressive aphasia, mild receptive aphasia and mild cognitive impairment. (Of note, patient had recently been given sedating medication to reduce anxiety during lumbar puncture procedure. Effects of medication likely exacerbated her cognitive and linguistic abilities. Patient's expressive aphasia presented as word finding errors in connected speech, decreased divergent naming but [facility] for confrontational naming. She had more difficulty with more abstract convergent naming task. When describing object function, her responses tended to be more simplistic and during open ended question responses as well as conversation, she exhibited increased use of fillers (so, uh, etc), was somewhat circumlocutious. Durign divergent naming task, she also exhibited phonemic paraphasias, especially with multisyllabic words, "adigrador" (alligator), "ferrin" (ferret) and although she was aware majority of the time, she struggled with being able to correct these errors. She required prolonged amount of time to read short paragraph story and then had difficulty in answering comprehension/recall questions. SLP is recommending outpatient ST services and patient and father both in agreement. SLP also advised patient to keep social circle limited to immediate family and maybe a close friend or two as she is recovering from CVA. In addition, SLP educated both on importance of allowing time for rest and recovery as brain continues to heal and recommended patient focus on some activities she enjoys (she reports enjoying to read, be outside, etc). SLP suspects that patient will recover well from her cognitive and linguistic impairments over time and with benefit from outpatient SLP.    SLP Assessment  SLP Recommendation/Assessment: All further Speech Lanaguage Pathology  needs can be addressed in the next venue of care SLP Visit Diagnosis: Aphasia (R47.01);Cognitive communication deficit (R41.841)    Follow Up Recommendations  Outpatient  SLP    Frequency and Duration  N/A      SLP Evaluation Cognition  Overall Cognitive Status: Impaired/Different from baseline Arousal/Alertness: Awake/alert Orientation Level: Oriented X4 Attention: Sustained Sustained Attention: Impaired Sustained Attention Impairment: Verbal complex Memory: Impaired Memory Impairment: Storage deficit Awareness: Appears intact Problem Solving: Appears intact Executive Function: Reasoning;Self Monitoring;Organizing Reasoning: Appears intact Organizing: Impaired Organizing Impairment: Verbal basic;Verbal complex Self Monitoring: Appears intact Safety/Judgment: Appears intact       Comprehension  Auditory Comprehension Overall Auditory Comprehension: Impaired Yes/No Questions: Within Functional Limits Commands: Within Functional Limits Conversation: Complex Interfering Components: Processing speed;Attention EffectiveTechniques: Extra processing time;Repetition Visual Recognition/Discrimination Discrimination: Within Function Limits Reading Comprehension Reading Status: Impaired Word level: Within functional limits Sentence Level: Within functional limits Paragraph Level: Impaired Functional Environmental (signs, name badge): Within functional limits Interfering Components: Attention;Working [employer] fast streamer    Expression Expression Primary Mode of Expression: Verbal Verbal Expression Overall Verbal Expression: Impaired Initiation: No impairment Level of Generative/Spontaneous Verbalization: Sentence;Conversation Repetition: No impairment Naming: Impairment Responsive: 76-100% accurate Confrontation: Within functional limits Convergent: 50-74% accurate Divergent: 50-74% accurate Verbal Errors: Phonemic paraphasias;Perseveration;Aware of errors Pragmatics: No impairment Interfering Components: Attention Effective Techniques: Semantic cues Non-Verbal Means of Communication: Not applicable Written Expression Written Expression: Not  tested   Oral / Motor  Oral Motor/Sensory Function Overall Oral Motor/Sensory Function: Within functional limits Motor Speech Overall Motor Speech: Appears within functional limits for tasks assessed   GO          Angela Nevin, MA, CCC-SLP Speech Therapy [facility] Acute Rehab

## 2021-04-12 NOTE — Evaluation (Signed)
Physical Therapy Evaluation & Discharge Patient Details Name: Meghan Williamson MRN: [id number] DOB: 06-Dec-2000 Today's Date: 04/12/2021   History of Present Illness  20 y/o female presetned to ED 5/29 with trouble speaking and headache. CT head (-) for acute abnormalities. MRI head (-) for acute infarct. PMH: asthma  Clinical Impression  PTA, patient lives with parents and reports independence. Patient currently functioning at independent level. Patient with no apparent balance deficits. No further skilled PT needs required acutely. No PT follow up recommended at this time.     Follow Up Recommendations No PT follow up    Equipment Recommendations  None recommended by PT    Recommendations for Other Services       Precautions / Restrictions Precautions Precautions: None Restrictions Weight Bearing Restrictions: No      Mobility  Bed Mobility Overal bed mobility: Independent                  Transfers Overall transfer level: Independent                  Ambulation/Gait Ambulation/Gait assistance: Independent Gait Distance (Feet): 200 Feet Assistive device: None Gait Pattern/deviations: WFL(Within Functional Limits)        Stairs Stairs: Yes Stairs assistance: Independent Stair Management: No rails;Alternating pattern;Forwards Number of Stairs: 2    Wheelchair Mobility    Modified Rankin (Stroke Patients Only)       Balance Overall balance assessment: No apparent balance deficits (not formally assessed)                                           Pertinent Vitals/Pain Pain Assessment: No/denies pain    Home Living Family/patient expects to be discharged to:: Private residence Living Arrangements: Parent Available Help at Discharge: Family;Available 24 hours/day Type of Home: House Home Access: Stairs to enter Entrance Stairs-Rails: Left Entrance Stairs-Number of Steps: 3 Home Layout: Two level Home Equipment: None       Prior Function Level of Independence: Independent         Comments: attends [employer], drives, works     Higher education careers adviser        Extremity/Trunk Assessment   Upper Extremity Assessment Upper Extremity Assessment: Overall WFL for tasks assessed    Lower Extremity Assessment Lower Extremity Assessment: Overall WFL for tasks assessed    Cervical / Trunk Assessment Cervical / Trunk Assessment: Normal  Communication   Communication: Expressive difficulties  Cognition Arousal/Alertness: Awake/alert Behavior During Therapy: WFL for tasks assessed/performed Overall Cognitive Status: Within Functional Limits for tasks assessed                                        General Comments      Exercises     Assessment/Plan    PT Assessment Patent does not need any further PT services  PT Problem List         PT Treatment Interventions      PT Goals (Current goals can be found in the Care Plan section)  Acute Rehab PT Goals Patient Stated Goal: to go home PT Goal Formulation: All assessment and education complete, DC therapy    Frequency     Barriers to discharge        Co-evaluation  AM-PAC PT "6 Clicks" Mobility  Outcome Measure Help needed turning from your back to your side while in a flat bed without using bedrails?: None Help needed moving from lying on your back to sitting on the side of a flat bed without using bedrails?: None Help needed moving to and from a bed to a chair (including a wheelchair)?: None Help needed standing up from a chair using your arms (e.g., wheelchair or bedside chair)?: None Help needed to walk in hospital room?: None Help needed climbing 3-5 steps with a railing? : None 6 Click Score: 24    End of Session   Activity Tolerance: Patient tolerated treatment well Patient left: in bed;with call bell/phone within reach;with family/visitor present Nurse Communication: Mobility status PT  Visit Diagnosis: Muscle weakness (generalized) (M62.81)    Time: 1324-4010 PT Time Calculation (min) (ACUTE ONLY): 14 min   Charges:   PT Evaluation $PT Eval Low Complexity: 1 Low          Michaeleen Down A. Dan Humphreys PT, DPT Acute Rehabilitation Services Pager [phone] Office [phone]   Viviann Spare 04/12/2021, 9:25 AM

## 2021-04-12 NOTE — Plan of Care (Signed)

## 2021-04-12 NOTE — Consult Note (Signed)
Neurology Consultation  Reason for Consult: MRI with acute ischemic cortical infarct involving the left temporal, occipital, and parietal region.  Referring Physician: Dr. Catha Gosselin  CC: Expressive aphasia  History is obtained from: Chart Review, Patient, Patient's mother at bedside  HPI: Meghan Williamson is a 20 y.o. female with a medical history significant for asthma, intermittent seasonal allergies, and occasional marijuana and tobacco use who presented to the ED on 5/29 for evaluation of expressive aphasia. Patient states that she was driving home from work at around 02:00 on 5/29 when she had a mild left frontal headache. She arrived home from work and shortly after, her sister was speaking with her and noticed that she was having trouble speaking. Her speech was described as gibberish and unintelligible and somewhat dysarthric. Meghan Williamson attempted to text her mother at that time with a text message that did not make sense. She went to bed and when she woke up, she was able to communicate with her mother until her mother began asking her questions about her schedule and noticed that she had some residual word-finding difficulties. She was then taken to urgent care and sent to the ED for further evaluation. Meghan Williamson takes OCP medication for management of her menstrual cycle and took an ibuprofen with her headache but denies taking other medications.   She denies any infectious/inflammatory signs or symptoms, any prior transient neurological symptoms  LKW: 5/29 0100 tpa given?: no, outside of time window IR Thrombectomy? No, presentation not consistent with LVO Modified Rankin Scale: 0-Completely asymptomatic and back to baseline post- stroke  ROS: A complete ROS was performed and is negative except as noted in the HPI.   Past Medical History:  Diagnosis Date  . Anxiety   . Asthma   History reviewed. No pertinent surgical history.   History reviewed. No pertinent family history.  Specifically  patient and her mother deny a family history of autoimmune disorders or early strokes though there was an uncle who passed away at age 13 in a motorcycle accident and there is question of whether he had strokes prior to that  Social History:   reports that she has been smoking. She has never used smokeless tobacco. She reports current drug use. Drug: Marijuana. She reports that she does not drink alcohol.  Current Outpatient Medications  Medication Instructions  . ibuprofen (ADVIL) 400 mg, Oral, Every 6 hours PRN  . SRONYX 0.1-20 MG-MCG tablet 1 tablet, Oral, Daily    Medications  Current Facility-Administered Medications:  .  0.9 %  sodium chloride infusion, , Intravenous, Continuous, Chotiner, Claudean Severance, MD, Last Rate: 100 mL/hr at 04/12/21 0600, Infusion Verify at 04/12/21 0600 .  acetaminophen (TYLENOL) tablet 650 mg, 650 mg, Oral, Q4H PRN, 650 mg at 04/11/21 2245 **OR** acetaminophen (TYLENOL) 160 MG/5ML solution 650 mg, 650 mg, Per Tube, Q4H PRN **OR** acetaminophen (TYLENOL) suppository 650 mg, 650 mg, Rectal, Q4H PRN, Chotiner, Claudean Severance, MD .  Melene Muller ON 04/13/2021] aspirin EC tablet 81 mg, 81 mg, Oral, Daily, Cindie Laroche, Stevi W, NP .  [START ON 04/13/2021] atorvastatin (LIPITOR) tablet 40 mg, 40 mg, Oral, QHS, Tishina Lown L, MD  Exam: Current vital signs: BP (!) 142/91 (BP Location: Left Arm)   Pulse (!) 54   Temp 98.4 F (36.9 C) (Oral)   Resp 18   Ht 5\' 6"  (1.676 m)   Wt 59.8 kg   SpO2 100%   BMI 21.28 kg/m  Vital signs in last 24 hours: Temp:  [98.1 F (  36.7 C)-98.9 F (37.2 C)] 98.4 F (36.9 C) (05/30 0751) Pulse Rate:  [52-84] 54 (05/30 0751) Resp:  [14-23] 18 (05/30 0751) BP: (121-168)/(75-104) 142/91 (05/30 0751) SpO2:  [99 %-100 %] 100 % (05/30 0751) Weight:  [58.1 kg-59.8 kg] 59.8 kg (05/29 2106)  GENERAL: Awake, alert, talking with mother at bedside, in no acute distress Psych: tearful, affect appropriate for situation, calm and cooperative with  examination Head: Normocephalic and atraumatic, without obvious abnormality EENT: No OP obstruction, normal conjunctivae LUNGS: Normal respiratory effort. Non-labored breathing CV: Bradycardia on telemetry, extremities warm without edema ABDOMEN: Soft, non-tender Ext: warm, well perfused, without obvious deformity  NEURO:  Mental Status: Awake, alert, and oriented to self, age, location, and situation. When asked the year she states "May" then when asked again she states "the 30th". She then states that the year is 2021.  Speech is intact without dysarthria but she does have some expressive aphasia. She is able to name watch, thumb, pen but is unable to name "knuckles" or "band" of the watch. With word finding, she does get upset when she is unable to speak or name objects appropriately.  Repetition partially intact, replaces some words ie "today is a sunny day" she states "today is a good day" two consecutive times.  Cranial Nerves:  II: PERRL 4 mm/brisk. Visual fields full.  III, IV, VI: EOMI without ptosis. Lid elevation symmetric and full.  V: Sensation is intact to light touch and symmetrical to face.  VII: Face is symmetric resting and smiling.   VIII: Hearing is intact to voice IX, X: Palate elevation is symmetric. Phonation normal.  XI: Normal sternocleidomastoid and trapezius muscle strength XII: Tongue protrudes midline without fasciculations.   Motor: 5/5 strength is all muscle groups without vertical drift on assessment.  Tone and bulk are normal.  Sensation: Intact to light touch bilaterally in all four extremities. Coordination: FTN intact bilaterally. HKS intact bilaterally.  DTRs: 2+ and symmetric bilateral patellae and biceps Gait: deferred  NIHSS: 1a Level of Conscious.: 0 1b LOC Questions: 0 1c LOC Commands: 0 2 Best Gaze: 0 3 Visual: 0 4 Facial Palsy: 0 5a Motor Arm - left: 0 5b Motor Arm - Right: 0 6a Motor Leg - Left: 0 6b Motor Leg - Right: 0 7 Limb  Ataxia: 0 8 Sensory: 0 9 Best Language: 1 10 Dysarthria: 0 11 Extinct. and Inatten.: 0 TOTAL: 1  Labs I have reviewed labs in epic and the results pertinent to this consultation are: CBC    Component Value Date/Time   WBC 4.8 04/11/2021 1444   RBC 4.73 04/11/2021 1444   HGB 13.0 04/11/2021 1444   HCT 38.7 04/11/2021 1444   PLT 266 04/11/2021 1444   MCV 81.8 04/11/2021 1444   MCH 27.5 04/11/2021 1444   MCHC 33.6 04/11/2021 1444   RDW 12.2 04/11/2021 1444   LYMPHSABS 1.8 04/11/2021 1444   MONOABS 0.5 04/11/2021 1444   EOSABS 0.1 04/11/2021 1444   BASOSABS 0.0 04/11/2021 1444   CMP     Component Value Date/Time   NA 137 04/11/2021 1444   K 4.0 04/11/2021 1444   CL 105 04/11/2021 1444   CO2 24 04/11/2021 1444   GLUCOSE 87 04/11/2021 1444   BUN 10 04/11/2021 1444   CREATININE 0.79 04/11/2021 1444   CALCIUM 9.7 04/11/2021 1444   PROT 7.2 04/11/2021 1444   ALBUMIN 4.4 04/11/2021 1444   AST 12 (L) 04/11/2021 1444   ALT 9 04/11/2021 1444   ALKPHOS  45 04/11/2021 1444   BILITOT 0.4 04/11/2021 1444   GFRNONAA >60 04/11/2021 1444   Lipid Panel     Component Value Date/Time   CHOL 151 04/12/2021 0147   TRIG 110 04/12/2021 0147   HDL 40 (L) 04/12/2021 0147   CHOLHDL 3.8 04/12/2021 0147   VLDL 22 04/12/2021 0147   LDLCALC 89 04/12/2021 0147   No results found for: HGBA1C   Coagulation Studies: Recent Labs    04/11/21 1444  LABPROT 14.0  INR 1.1    PTT 26 (WNL)  Imaging I have reviewed the images obtained:  CT-scan of the brain personally reviewed by MD: Normal unenhanced CT scan of the brain on radiology read though on review after examining MRI brain, there is some subtle hypodensity in the left MCA territory correlating with the stroke on MRI  MRI HEAD personally reviewed by MD: 1. 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 Possible minimal petechial hemorrhage without hemorrhagic transformation or mass  effect. 2. Additional multifocal acute ischemic infarcts involving the right frontal lobe, right basal ganglia, right temporoccipital region, and cerebellum as above. No other associated hemorrhage or mass effect. 3. Underlying remote lacunar infarcts involving the left thalamus and cerebellum. 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 Changes are certainly advanced for patient age. Possible vasculitis would be the primary differential consideration given patient age and the presence of multiple acute and chronic infarcts.  MRA HEAD personally reviewed by MD: 1. Question subtle diffuse small vessel irregularity about the intracranial circulation. Differential considerations include early atherosclerotic disease versus changes of vasculopathy. 2. Otherwise normal MRA appearance of the medium and large intracranial arterial vessels. No large vessel occlusion. No hemodynamically significant or correctable stenosis. 3. Question 2 mm focal outpouching arising from the anterior communicating artery complex, which could reflect focal vascular tortuosity versus a small aneurysm. Attention at follow-up recommended.  MRA NECK personally reviewed by MD: 1. Normal MRA of the neck. No hemodynamically significant stenosis or other acute vascular abnormality. 2. Aberrant right subclavian artery.  Assessment: 20 year old female with no significant medical history pertaining to strokes presented for evaluation of expressive aphasia and was found to have left temporal, occipital, parietal region acute ischemic infarcts.  - Stroke risk factors include occasional smoking (marijuana, tobacco) and OCP use. - MRI with acute ischemic cortical infarct of the left temporal, occipital, and parietal region with multifocal acute ischemic infarcts of the right frontal lobe, right basal ganglia,  right temporooccipital region, and cerebellum with remote lacunar infarcts of the left thalamus and cerebellum.  - Etiology felt to be likely embolic in nature however, MRA imaging with concern for possible vasculitis with diffuse small vessel irregularity. Will discuss with IR for angiogram for further vessel imaging / diagnostics. Also, patient will need LP for further evaluation to rule out VZV / infectious etiology for vasculitis. This is felt to be less likely due to lack of infectious signs / symptoms. - Given her young age, there is also the possibility she had a seizure at the time of stroke onset and given how quickly her aphasia improved (which could be a function of her age but could reflect seizure activity), we will obtain an routine EEG  Impression: Acute cerebral infarction of the left temporal, occipital, and parietal regions - likely embolic versus vasculitic Expressive aphasia  Remote lacunar infarcts  Recommendations:  #Multifocal strokes, with the  largest in the left MCA territory - Stroke labs: HIV, RPR, TSH, HgbA1c, hypercoagulable panel, blood cultures - Autoimmune screening: ANA, SSA/SSB, ANCA - Routine EEG  - Discussed with IR for diagnostic angiogram: NPO at midnight for angiogram 5/31 - Frequent neuro checks - Echocardiogram with bubble study  - Lumbar puncture with cell counts and tubes 1 and 4, protein, glucose, VZV IgG, HSV 1/2 PCR, VDRL, bacterial culture and gram stain - Prophylactic therapy- Antiplatelet med: ASA 325 mg then 81 mg daily, consider DAPT following LP  - Initiate atorvastatin 40 mg daily PO for goal LDL < 70  - Risk factor modification  - Discontinue OCP in favor of non-hormonal method of birth control  - Telemetry monitoring - PT consult, OT consult, Speech consult - Stroke team to follow  #Incidental potential ACA aneurysm -Follow-up cerebral angiogram  Lanae Boast, AGAC-NP [facility] Pager: [phone]  Attending  Neurologist's note:  I personally saw this patient, gathering history, performing a neurologic examination, reviewing relevant labs, personally reviewing relevant imaging as detailed above, and formulated the assessment and plan, adding the note above for completeness and clarity to accurately reflect my thoughts.

## 2021-04-12 NOTE — Progress Notes (Signed)
PROGRESS NOTE    Meghan Williamson  JSE:[id number] DOB: 2001-01-10 DOA: 04/11/2021 PCP: Pcp, No   Brief Narrative:  HPI on 04/11/2021 by Dr. Elige Radon Chotiner Meghan Williamson is a 20 y.o. female with medical history of intermittent seasonal allergies inducing asthma with no respiratory problems or symptoms. Presented to [facility] ER for evaluation of difficulty speaking and memory problems that began last pm. She states she was driving home from work around 1 AM when she developed a left-sided headache that she reports was throbbing but did not radiate.  She had no visual changes or auras with the headache.  She has no history of migraine headaches.  After she arrived home she had difficulty speaking.  She states that when she tried to talk to her sister she had difficulty finding the words to say and when she did speak the words were jumbled and did not make sense.  She tried texting her mother who was on vacation at the beach at the time and she could only type out jumbled letters that did not make coherent sense.  And has some memory lapses and difficulty remembering things.  Parents came home from the beach this morning.  She was taken to the emergency room for evaluation.  There is no report of any drooping face or difficulty walking or imbalance.  There is no report of any seizure-like activity or tremors and jerking of extremities.  He had no fever, nausea vomiting or diarrhea.  There is no history of seizures or migraine headaches in the family.  There is no known family history of clotting disorders. She is on oral contraceptives to regulate her menstrual period.  She has been under a lot of stress recently.  She just finished her first year of college at [employer] and returned home for the summer recently.  She is working at [employer], [employer], as a Psychologist, educational.  She has never had symptoms like this in the past. Does state that she occasionally uses vaping products or smoke a cigarette.  She does  report occasional marijuana use with her last use last night.  She denies any heroin, cocaine, LSD, PCP, ecstasy.  She denies alcohol use.  Interim history Patient presented with memory and speech deficits.  MRI did show CVA.  Pending complete work-up and neurology consultation. Assessment & Plan   Acute CVA -Patient presented with speech difficulties and memory deficits -CT head was unremarkable -MRI brain showed acute ischemic cortical infarct involving left temporal occipital and parietal region possible minimal petechial hemorrhage without hemorrhagic transformation or mass-effect.  Multifocal acute ischemic infarcts involving the right frontal lobe, right basal ganglia, right temporal occipital region and cerebellum.  Underlying remote lacunar infarcts involving left thalamus and cerebellum.  -MRA head: Question subtle diffuse small vessel irregularity about the intracranial circulation, considerations for early atherosclerotic disease versus changes in vasculopathy.  Otherwise normal MRA, no large vessel occlusion.  Question 2 mm focal outpouching arising from the anterior communicating artery complex could reflect focal vascular tortuosity versus a small aneurysm. -MRA neck unremarkable -LDL 89, hemoglobin A1c pending -Echocardiogram pending -Of note patient is on oral contraceptives and started approximately 1 year ago.  She also was diagnosed with COVID in November 2020. -UDS positive for [facility] -Pending PT, OT, speech therapy evaluation -Neurology consulted and appreciated   DVT Prophylaxis  SCDs  Code Status: Full  Family Communication: Mother at bedside  Disposition Plan:  Status is: Observation  The patient will require care spanning >  2 midnights and should be moved to inpatient because: Inpatient level of care appropriate due to severity of illness  Dispo: The patient is from: Home              Anticipated d/c is to: Home              Patient currently is not medically  stable to d/c.   Difficult to place patient No    Consultants Neurology  Procedures  none  Antibiotics   Anti-infectives (From admission, onward)   None      Subjective:   Meghan Williamson seen and examined today.  Patient with some language comprehension issues.  She denies any current dizziness or headache, chest pain, shortness of breath, abdominal pain, nausea or vomiting, diarrhea or constipation, weakness.  Frustrated that she cannot get her words out.  Objective:   Vitals:   04/12/21 0146 04/12/21 0354 04/12/21 0621 04/12/21 0751  BP: (!) 153/95 121/75 (!) 130/92 (!) 142/91  Pulse: (!) 55 (!) 56 (!) 52 (!) 54  Resp: 16 14 14 18   Temp: 98.1 F (36.7 C) 98.2 F (36.8 C) 98.3 F (36.8 C) 98.4 F (36.9 C)  TempSrc: Oral Oral Oral Oral  SpO2: 100% 99% 100% 100%  Weight:      Height:        Intake/Output Summary (Last 24 hours) at 04/12/2021 0916 Last data filed at 04/12/2021 0600 Gross per 24 hour  Intake 1958.93 ml  Output --  Net 1958.93 ml   Filed Weights   04/11/21 1352 04/11/21 2106  Weight: 58.1 kg 59.8 kg    Exam  General: Well developed, well nourished, NAD, appears stated age  HEENT: NCAT, PERRLA, EOMI, Anicteic Sclera, mucous membranes moist.   Neck: Supple  Cardiovascular: S1 S2 auscultated, RRR, no murmur.  Respiratory: Clear to auscultation bilaterally with equal chest rise  Abdomen: Soft, nontender, nondistended, + bowel sounds  Extremities: warm dry without cyanosis clubbing or edema  Neuro: AAOx3,  Strength 5/5 in patient's upper and lower extremities bilaterally. Mild dysarthria and comprehension deficits   Skin: Without rashes exudates or nodules  Psych: anxious however appropriate   Data Reviewed: I have personally reviewed following labs and imaging studies  CBC: Recent Labs  Lab 04/11/21 1444  WBC 4.8  NEUTROABS 2.4  HGB 13.0  HCT 38.7  MCV 81.8  PLT 266   Basic Metabolic Panel: Recent Labs  Lab 04/11/21 1444  NA  137  K 4.0  CL 105  CO2 24  GLUCOSE 87  BUN 10  CREATININE 0.79  CALCIUM 9.7   GFR: Estimated Creatinine Clearance: 106.8 mL/min (by C-G formula based on SCr of 0.79 mg/dL). Liver Function Tests: Recent Labs  Lab 04/11/21 1444  AST 12*  ALT 9  ALKPHOS 45  BILITOT 0.4  PROT 7.2  ALBUMIN 4.4   No results for input(s): LIPASE, AMYLASE in the last 168 hours. No results for input(s): AMMONIA in the last 168 hours. Coagulation Profile: Recent Labs  Lab 04/11/21 1444  INR 1.1   Cardiac Enzymes: No results for input(s): CKTOTAL, CKMB, CKMBINDEX, TROPONINI in the last 168 hours. BNP (last 3 results) No results for input(s): PROBNP in the last 8760 hours. HbA1C: No results for input(s): HGBA1C in the last 72 hours. CBG: Recent Labs  Lab 04/11/21 1421  GLUCAP 89   Lipid Profile: Recent Labs    04/12/21 0147  CHOL 151  HDL 40*  LDLCALC 89  TRIG 161110  CHOLHDL 3.8  Thyroid Function Tests: No results for input(s): TSH, T4TOTAL, FREET4, T3FREE, THYROIDAB in the last 72 hours. Anemia Panel: No results for input(s): VITAMINB12, FOLATE, FERRITIN, TIBC, IRON, RETICCTPCT in the last 72 hours. Urine analysis:    Component Value Date/Time   COLORURINE COLORLESS (A) 04/11/2021 1444   APPEARANCEUR CLEAR 04/11/2021 1444   LABSPEC 1.009 04/11/2021 1444   PHURINE 7.5 04/11/2021 1444   GLUCOSEU NEGATIVE 04/11/2021 1444   HGBUR MODERATE (A) 04/11/2021 1444   BILIRUBINUR NEGATIVE 04/11/2021 1444   KETONESUR NEGATIVE 04/11/2021 1444   PROTEINUR NEGATIVE 04/11/2021 1444   NITRITE NEGATIVE 04/11/2021 1444   LEUKOCYTESUR NEGATIVE 04/11/2021 1444   Sepsis Labs: (procalcitonin:4,lacticidven:4)  ) Recent Results (from the past 240 hour(s))  Resp Panel by RT-PCR (Flu A&B, Covid) Nasopharyngeal Swab     Status: None   Collection Time: 04/11/21  4:08 PM   Specimen: Nasopharyngeal Swab; Nasopharyngeal(NP) swabs in vial transport medium  Result Value Ref Range Status    SARS Coronavirus 2 by RT PCR NEGATIVE NEGATIVE Final    Comment: (NOTE) SARS-CoV-2 target nucleic acids are NOT DETECTED.  The SARS-CoV-2 RNA is generally detectable in upper respiratory specimens during the acute phase of infection. The lowest concentration of SARS-CoV-2 viral copies this assay can detect is 138 copies/mL. A negative result does not preclude SARS-Cov-2 infection and should not be used as the sole basis for treatment or other patient management decisions. A negative result may occur with  improper specimen collection/handling, submission of specimen other than nasopharyngeal swab, presence of viral mutation(s) within the areas targeted by this assay, and inadequate number of viral copies(<138 copies/mL). A negative result must be combined with clinical observations, patient history, and epidemiological information. The expected result is Negative.  Fact Sheet for Patients:  BloggerCourse.com  Fact Sheet for Healthcare Providers:  SeriousBroker.it  This test is no t yet approved or cleared by the [place] FDA and  has been authorized for detection and/or diagnosis of SARS-CoV-2 by FDA under an Emergency Use Authorization (EUA). This EUA will remain  in effect (meaning this test can be used) for the duration of the COVID-19 declaration under Section 564(b)(1) of the Act, 21 U.S.C.section 360bbb-3(b)(1), unless the authorization is terminated  or revoked sooner.       Influenza A by PCR NEGATIVE NEGATIVE Final   Influenza B by PCR NEGATIVE NEGATIVE Final    Comment: (NOTE) The Xpert Xpress SARS-CoV-2/FLU/RSV plus assay is intended as an aid in the diagnosis of influenza from Nasopharyngeal swab specimens and should not be used as a sole basis for treatment. Nasal washings and aspirates are unacceptable for Xpert Xpress SARS-CoV-2/FLU/RSV testing.  Fact Sheet for  Patients: BloggerCourse.com  Fact Sheet for Healthcare Providers: SeriousBroker.it  This test is not yet approved or cleared by the [place] FDA and has been authorized for detection and/or diagnosis of SARS-CoV-2 by FDA under an Emergency Use Authorization (EUA). This EUA will remain in effect (meaning this test can be used) for the duration of the COVID-19 declaration under Section 564(b)(1) of the Act, 21 U.S.C. section 360bbb-3(b)(1), unless the authorization is terminated or revoked.  Performed at [facility], [address], [place], [place] [address]       Radiology Studies: CT HEAD WO CONTRAST  Result Date: 04/11/2021 CLINICAL DATA:  Pt arrives to ED with c/o an expressive aphasic episode today starting at 1pm where she could not speak words. Pt states this episode may of lasted a few minutes. Pt reports  she started a new job and it requires he to talk to a lot of new people. Pt does reports headache before the event and headache yesterday after twist her neck and hearing a "poping" noise EXAM: CT HEAD WITHOUT CONTRAST TECHNIQUE: Contiguous axial images were obtained from the base of the skull through the vertex without intravenous contrast. COMPARISON:  None. FINDINGS: Brain: No evidence of acute infarction, hemorrhage, hydrocephalus, extra-axial collection or mass lesion/mass effect. Vascular: No hyperdense vessel or unexpected calcification. Skull: Normal. Negative for fracture or focal lesion. Sinuses/Orbits: Visualized globes and orbits are unremarkable. Visualized sinuses are clear. Other: None. IMPRESSION: Normal unenhanced CT scan of the brain. Electronically Signed   By: Amie Portland M.D.   On: 04/11/2021 14:49   MR ANGIO HEAD WO CONTRAST  Result Date: 04/12/2021 CLINICAL DATA:  Initial evaluation for acute dysarthria. EXAM: MRI HEAD WITHOUT CONTRAST MRA HEAD WITHOUT CONTRAST MRA NECK WITHOUT AND  WITH CONTRAST TECHNIQUE: Multiplanar, multi-echo pulse sequences of the brain and surrounding structures were acquired without intravenous contrast. Angiographic images of the Circle of Willis were acquired using MRA technique without intravenous contrast. Angiographic images of the neck were acquired using MRA technique without and with intravenous contrast. Carotid stenosis measurements (when applicable) are obtained utilizing NASCET criteria, using the distal internal carotid diameter as the denominator. CONTRAST:  6mL GADAVIST GADOBUTROL 1 MMOL/ML IV SOLN COMPARISON:  Prior head CT from 04/11/2021. FINDINGS: MRI HEAD FINDINGS Brain: Cerebral volume within normal limits. Few scattered foci of patchy T2/FLAIR signal abnormality seen involving the periventricular and deep white matter both cerebral hemispheres, nonspecific, but suspected to reflect chronic microvascular ischemic disease, definitely advanced for age. Remote lacunar infarct present at the ventral medial left thalamus. A few tiny remote bilateral cerebellar infarcts noted. Patchy and confluent restricted diffusion involving primarily the cortex of the left temporal occipital and parietal region, consistent with acute ischemic infarct. A few suspected subtle foci of susceptibility artifact within the area of infarction, suggestive of minimal petechial hemorrhage. No frank hemorrhagic transformation or mass effect. There are additional scattered subcentimeter foci of restricted diffusion involving the right basal ganglia (series 5, images 79, 78). Patchy right PCA distribution infarcts involving the right occipital lobe and right hippocampus (series 5, images 71, 70). Few punctate bilateral cerebellar infarcts (series 5, images 60, 66 additional punctate right frontal cortical infarct (series 5, image 88). No other associated hemorrhage or mass effect. No mass lesion or midline shift. No hydrocephalus or extra-axial fluid collection. Pituitary gland  within normal limits for age. Midline structures intact. Vascular: Major intracranial vascular flow voids are maintained. Skull and upper cervical spine: Craniocervical junction within normal limits. Bone marrow signal intensity normal. No scalp soft tissue abnormality. Sinuses/Orbits: Globes and orbital soft tissues within normal limits. Paranasal sinuses are clear. No significant mastoid effusion. Inner ear structures grossly normal. Other: None. MRA HEAD FINDINGS ANTERIOR CIRCULATION: Visualized distal cervical segments of the internal carotid arteries are patent with antegrade flow. Petrous, cavernous, and supraclinoid segments patent without stenosis or other abnormality. A1 segments patent. Question 2 mm focal outpouching extending inferiorly from the anterior communicating artery complex (series 1037, image 8). Anterior cerebral arteries patent to their distal aspects without stenosis. No M1 stenosis or occlusion. Normal MCA bifurcations. MCA branches well perfused and symmetric. There is question of diffuse small vessel irregularity about the MCA branches bilaterally, best seen at the proximal left M2 anterior division (series 1037, image 8). POSTERIOR CIRCULATION: Both vertebral arteries patent to the vertebrobasilar junction without  stenosis. Left vertebral artery slightly dominant. Both PICA origins patent and normal. Basilar patent to its distal aspect without stenosis. Superior cerebellar arteries patent bilaterally. Both PCAs supplied via the basilar as well as small bilateral posterior communicating arteries. PCAs well perfused to their distal aspects without proximal stenosis. Again there is question of possible subtle distal small vessel irregularity. No intracranial aneurysm or other vascular abnormality. MRA NECK FINDINGS AORTIC ARCH: Visualized aortic arch normal in caliber. Aberrant right subclavian artery noted. Right CCA arises directly from the aortic arch. No significant vascular  irregularity or stenosis about the origin of the great vessels. RIGHT CAROTID SYSTEM: Right common and internal carotid arteries widely patent without stenosis, evidence for dissection, or occlusion. LEFT CAROTID SYSTEM: Left common and internal carotid arteries widely patent without stenosis, evidence for dissection, or occlusion. VERTEBRAL ARTERIES: Both vertebral arteries arise from the subclavian arteries. Left vertebral artery dominant. Vertebral arteries patent without stenosis, evidence for dissection, or occlusion. IMPRESSION: MRI HEAD: 1. 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 Possible minimal petechial hemorrhage without hemorrhagic transformation or mass effect. 2. Additional multifocal acute ischemic infarcts involving the right frontal lobe, right basal ganglia, right temporoccipital region, and cerebellum as above. No other associated hemorrhage or mass effect. 3. Underlying remote lacunar infarcts involving the left thalamus and cerebellum. 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 Changes are certainly advanced for patient age. Possible vasculitis would be the primary differential consideration given patient age and the presence of multiple acute and chronic infarcts. MRA HEAD: 1. Question subtle diffuse small vessel irregularity about the intracranial circulation. Differential considerations include early atherosclerotic disease versus changes of vasculopathy. 2. Otherwise normal MRA appearance of the medium and large intracranial arterial vessels. No large vessel occlusion. No hemodynamically significant or correctable stenosis. 3. Question 2 mm focal outpouching arising from the anterior communicating artery complex, which could reflect focal vascular tortuosity versus a small aneurysm. Attention at follow-up  recommended. MRA NECK: 1. Normal MRA of the neck. No hemodynamically significant stenosis or other acute vascular abnormality. 2. Aberrant right subclavian artery. Electronically Signed   By: Rise Mu M.D.   On: 04/12/2021 05:48   MR ANGIO NECK W WO CONTRAST  Result Date: 04/12/2021 CLINICAL DATA:  Initial evaluation for acute dysarthria. EXAM: MRI HEAD WITHOUT CONTRAST MRA HEAD WITHOUT CONTRAST MRA NECK WITHOUT AND WITH CONTRAST TECHNIQUE: Multiplanar, multi-echo pulse sequences of the brain and surrounding structures were acquired without intravenous contrast. Angiographic images of the Circle of Willis were acquired using MRA technique without intravenous contrast. Angiographic images of the neck were acquired using MRA technique without and with intravenous contrast. Carotid stenosis measurements (when applicable) are obtained utilizing NASCET criteria, using the distal internal carotid diameter as the denominator. CONTRAST:  6mL GADAVIST GADOBUTROL 1 MMOL/ML IV SOLN COMPARISON:  Prior head CT from 04/11/2021. FINDINGS: MRI HEAD FINDINGS Brain: Cerebral volume within normal limits. Few scattered foci of patchy T2/FLAIR signal abnormality seen involving the periventricular and deep white matter both cerebral hemispheres, nonspecific, but suspected to reflect chronic microvascular ischemic disease, definitely advanced for age. Remote lacunar infarct present at the ventral medial left thalamus. A few tiny remote bilateral cerebellar infarcts noted. Patchy and confluent restricted diffusion involving primarily the cortex of the left temporal occipital and parietal region, consistent with acute ischemic infarct. A few suspected subtle foci of susceptibility artifact within the area of infarction, suggestive  of minimal petechial hemorrhage. No frank hemorrhagic transformation or mass effect. There are additional scattered subcentimeter foci of restricted diffusion involving the right basal ganglia  (series 5, images 79, 78). Patchy right PCA distribution infarcts involving the right occipital lobe and right hippocampus (series 5, images 71, 70). Few punctate bilateral cerebellar infarcts (series 5, images 60, 66 additional punctate right frontal cortical infarct (series 5, image 88). No other associated hemorrhage or mass effect. No mass lesion or midline shift. No hydrocephalus or extra-axial fluid collection. Pituitary gland within normal limits for age. Midline structures intact. Vascular: Major intracranial vascular flow voids are maintained. Skull and upper cervical spine: Craniocervical junction within normal limits. Bone marrow signal intensity normal. No scalp soft tissue abnormality. Sinuses/Orbits: Globes and orbital soft tissues within normal limits. Paranasal sinuses are clear. No significant mastoid effusion. Inner ear structures grossly normal. Other: None. MRA HEAD FINDINGS ANTERIOR CIRCULATION: Visualized distal cervical segments of the internal carotid arteries are patent with antegrade flow. Petrous, cavernous, and supraclinoid segments patent without stenosis or other abnormality. A1 segments patent. Question 2 mm focal outpouching extending inferiorly from the anterior communicating artery complex (series 1037, image 8). Anterior cerebral arteries patent to their distal aspects without stenosis. No M1 stenosis or occlusion. Normal MCA bifurcations. MCA branches well perfused and symmetric. There is question of diffuse small vessel irregularity about the MCA branches bilaterally, best seen at the proximal left M2 anterior division (series 1037, image 8). POSTERIOR CIRCULATION: Both vertebral arteries patent to the vertebrobasilar junction without stenosis. Left vertebral artery slightly dominant. Both PICA origins patent and normal. Basilar patent to its distal aspect without stenosis. Superior cerebellar arteries patent bilaterally. Both PCAs supplied via the basilar as well as small  bilateral posterior communicating arteries. PCAs well perfused to their distal aspects without proximal stenosis. Again there is question of possible subtle distal small vessel irregularity. No intracranial aneurysm or other vascular abnormality. MRA NECK FINDINGS AORTIC ARCH: Visualized aortic arch normal in caliber. Aberrant right subclavian artery noted. Right CCA arises directly from the aortic arch. No significant vascular irregularity or stenosis about the origin of the great vessels. RIGHT CAROTID SYSTEM: Right common and internal carotid arteries widely patent without stenosis, evidence for dissection, or occlusion. LEFT CAROTID SYSTEM: Left common and internal carotid arteries widely patent without stenosis, evidence for dissection, or occlusion. VERTEBRAL ARTERIES: Both vertebral arteries arise from the subclavian arteries. Left vertebral artery dominant. Vertebral arteries patent without stenosis, evidence for dissection, or occlusion. IMPRESSION: MRI HEAD: 1. 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 Possible minimal petechial hemorrhage without hemorrhagic transformation or mass effect. 2. Additional multifocal acute ischemic infarcts involving the right frontal lobe, right basal ganglia, right temporoccipital region, and cerebellum as above. No other associated hemorrhage or mass effect. 3. Underlying remote lacunar infarcts involving the left thalamus and cerebellum. 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 Changes are certainly advanced for patient age. Possible vasculitis would be the primary differential consideration given patient age and the presence of multiple acute and chronic infarcts. MRA HEAD: 1. Question subtle diffuse small vessel irregularity about the intracranial circulation. Differential considerations  include early atherosclerotic disease versus changes of vasculopathy. 2. Otherwise normal MRA appearance of the medium and large intracranial arterial vessels. No large vessel occlusion. No hemodynamically significant or correctable stenosis. 3. Question 2 mm focal outpouching arising from the anterior communicating artery complex, which  could reflect focal vascular tortuosity versus a small aneurysm. Attention at follow-up recommended. MRA NECK: 1. Normal MRA of the neck. No hemodynamically significant stenosis or other acute vascular abnormality. 2. Aberrant right subclavian artery. Electronically Signed   By: Rise Mu M.D.   On: 04/12/2021 05:48   MR BRAIN WO CONTRAST  Result Date: 04/12/2021 CLINICAL DATA:  Initial evaluation for acute dysarthria. EXAM: MRI HEAD WITHOUT CONTRAST MRA HEAD WITHOUT CONTRAST MRA NECK WITHOUT AND WITH CONTRAST TECHNIQUE: Multiplanar, multi-echo pulse sequences of the brain and surrounding structures were acquired without intravenous contrast. Angiographic images of the Circle of Willis were acquired using MRA technique without intravenous contrast. Angiographic images of the neck were acquired using MRA technique without and with intravenous contrast. Carotid stenosis measurements (when applicable) are obtained utilizing NASCET criteria, using the distal internal carotid diameter as the denominator. CONTRAST:  6mL GADAVIST GADOBUTROL 1 MMOL/ML IV SOLN COMPARISON:  Prior head CT from 04/11/2021. FINDINGS: MRI HEAD FINDINGS Brain: Cerebral volume within normal limits. Few scattered foci of patchy T2/FLAIR signal abnormality seen involving the periventricular and deep white matter both cerebral hemispheres, nonspecific, but suspected to reflect chronic microvascular ischemic disease, definitely advanced for age. Remote lacunar infarct present at the ventral medial left thalamus. A few tiny remote bilateral cerebellar infarcts noted. Patchy and confluent restricted  diffusion involving primarily the cortex of the left temporal occipital and parietal region, consistent with acute ischemic infarct. A few suspected subtle foci of susceptibility artifact within the area of infarction, suggestive of minimal petechial hemorrhage. No frank hemorrhagic transformation or mass effect. There are additional scattered subcentimeter foci of restricted diffusion involving the right basal ganglia (series 5, images 79, 78). Patchy right PCA distribution infarcts involving the right occipital lobe and right hippocampus (series 5, images 71, 70). Few punctate bilateral cerebellar infarcts (series 5, images 60, 66 additional punctate right frontal cortical infarct (series 5, image 88). No other associated hemorrhage or mass effect. No mass lesion or midline shift. No hydrocephalus or extra-axial fluid collection. Pituitary gland within normal limits for age. Midline structures intact. Vascular: Major intracranial vascular flow voids are maintained. Skull and upper cervical spine: Craniocervical junction within normal limits. Bone marrow signal intensity normal. No scalp soft tissue abnormality. Sinuses/Orbits: Globes and orbital soft tissues within normal limits. Paranasal sinuses are clear. No significant mastoid effusion. Inner ear structures grossly normal. Other: None. MRA HEAD FINDINGS ANTERIOR CIRCULATION: Visualized distal cervical segments of the internal carotid arteries are patent with antegrade flow. Petrous, cavernous, and supraclinoid segments patent without stenosis or other abnormality. A1 segments patent. Question 2 mm focal outpouching extending inferiorly from the anterior communicating artery complex (series 1037, image 8). Anterior cerebral arteries patent to their distal aspects without stenosis. No M1 stenosis or occlusion. Normal MCA bifurcations. MCA branches well perfused and symmetric. There is question of diffuse small vessel irregularity about the MCA branches  bilaterally, best seen at the proximal left M2 anterior division (series 1037, image 8). POSTERIOR CIRCULATION: Both vertebral arteries patent to the vertebrobasilar junction without stenosis. Left vertebral artery slightly dominant. Both PICA origins patent and normal. Basilar patent to its distal aspect without stenosis. Superior cerebellar arteries patent bilaterally. Both PCAs supplied via the basilar as well as small bilateral posterior communicating arteries. PCAs well perfused to their distal aspects without proximal stenosis. Again there is question of possible subtle distal small vessel irregularity. No intracranial aneurysm or other vascular abnormality. MRA NECK FINDINGS AORTIC ARCH: Visualized aortic arch normal in caliber. Aberrant  right subclavian artery noted. Right CCA arises directly from the aortic arch. No significant vascular irregularity or stenosis about the origin of the great vessels. RIGHT CAROTID SYSTEM: Right common and internal carotid arteries widely patent without stenosis, evidence for dissection, or occlusion. LEFT CAROTID SYSTEM: Left common and internal carotid arteries widely patent without stenosis, evidence for dissection, or occlusion. VERTEBRAL ARTERIES: Both vertebral arteries arise from the subclavian arteries. Left vertebral artery dominant. Vertebral arteries patent without stenosis, evidence for dissection, or occlusion. IMPRESSION: MRI HEAD: 1. 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 Possible minimal petechial hemorrhage without hemorrhagic transformation or mass effect. 2. Additional multifocal acute ischemic infarcts involving the right frontal lobe, right basal ganglia, right temporoccipital region, and cerebellum as above. No other associated hemorrhage or mass effect. 3. Underlying remote lacunar infarcts involving the left thalamus and cerebellum. 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 Changes are certainly advanced for patient age. Possible vasculitis would be the primary differential consideration given patient age and the presence of multiple acute and chronic infarcts. MRA HEAD: 1. Question subtle diffuse small vessel irregularity about the intracranial circulation. Differential considerations include early atherosclerotic disease versus changes of vasculopathy. 2. Otherwise normal MRA appearance of the medium and large intracranial arterial vessels. No large vessel occlusion. No hemodynamically significant or correctable stenosis. 3. Question 2 mm focal outpouching arising from the anterior communicating artery complex, which could reflect focal vascular tortuosity versus a small aneurysm. Attention at follow-up recommended. MRA NECK: 1. Normal MRA of the neck. No hemodynamically significant stenosis or other acute vascular abnormality. 2. Aberrant right subclavian artery. Electronically Signed   By: Rise Mu M.D.   On: 04/12/2021 05:48     Scheduled Meds: Continuous Infusions: . sodium chloride 100 mL/hr at 04/12/21 0600     LOS: 0 days   Time Spent in minutes   45 minutes  Moriya Mitchell D.O. on 04/12/2021 at 9:16 AM  Between 7am to 7pm - Please see pager noted on [email or web address]  After 7pm go to [email or web address]  And look for the night coverage person covering for me after hours  [facility] Office  [phone]

## 2021-04-12 NOTE — Procedures (Signed)
LUMBAR PUNCTURE (SPINAL TAP) PROCEDURE NOTE  Indication: Concern for vasculitis as acute ischemic stroke etiology    Proceduralists: Lanae Boast, NP and Dr. Iver Nestle   Risks of the procedure were dicussed with the patient including post-LP headache, bleeding, infection, weakness/numbness of legs(radiculopathy), death.    Consent obtained from: patient    Procedure Note The patient was prepped and draped, and using sterile technique a 20 gauge quinke spinal needle was inserted in the L4-5 space.   Approximately 18 cc of CSF were obtained and sent for analysis.  Patient tolerated the procedure well and blood loss was minimal.  Attending MD verified puncture site with NP and remained at bedside for evaluation throughout the entire procedure.    Lanae Boast, AGAC-NP [facility] Pager: [phone]

## 2021-04-13 ENCOUNTER — Inpatient Hospital Stay (HOSPITAL_COMMUNITY): Payer: BC Managed Care – PPO

## 2021-04-13 ENCOUNTER — Encounter (HOSPITAL_COMMUNITY): Payer: Self-pay | Admitting: Internal Medicine

## 2021-04-13 DIAGNOSIS — I639 Cerebral infarction, unspecified: Secondary | ICD-10-CM

## 2021-04-13 DIAGNOSIS — I6389 Other cerebral infarction: Secondary | ICD-10-CM

## 2021-04-13 HISTORY — PX: IR ANGIO INTRA EXTRACRAN SEL INTERNAL CAROTID BILAT MOD SED: IMG5363

## 2021-04-13 HISTORY — PX: IR ANGIO VERTEBRAL SEL SUBCLAVIAN INNOMINATE BILAT MOD SED: IMG5366

## 2021-04-13 HISTORY — PX: IR ANGIO EXTERNAL CAROTID SEL EXT CAROTID BILAT MOD SED: IMG5372

## 2021-04-13 HISTORY — PX: IR US GUIDE VASC ACCESS RIGHT: IMG2390

## 2021-04-13 LAB — ANA W/REFLEX IF POSITIVE: Anti Nuclear Antibody (ANA): NEGATIVE

## 2021-04-13 LAB — ECHOCARDIOGRAM COMPLETE BUBBLE STUDY
AR max vel: 3.57 cm2
AV Area VTI: 3.05 cm2
AV Area mean vel: 3.2 cm2
AV Mean grad: 2.7 mmHg
AV Peak grad: 4.8 mmHg
Ao pk vel: 1.1 m/s
Area-P 1/2: 4.06 cm2
S' Lateral: 2.7 cm

## 2021-04-13 LAB — BETA-2-GLYCOPROTEIN I ABS, IGG/M/A
Beta-2 Glyco I IgG: <9 GPI IgG units (ref 0–20)
Beta-2-Glycoprotein I IgA: <9 GPI IgA units (ref 0–25)
Beta-2-Glycoprotein I IgM: <9 GPI IgM units (ref 0–32)

## 2021-04-13 LAB — HEMOGLOBIN A1C

## 2021-04-13 LAB — PROTEIN C ACTIVITY: Protein C Activity: 85 % (ref 73–180)

## 2021-04-13 LAB — PROTEIN S, TOTAL: Protein S Ag, Total: 47 % — ABNORMAL LOW (ref 60–150)

## 2021-04-13 LAB — LUPUS ANTICOAGULANT PANEL
DRVVT: 32.1 s (ref 0.0–47.0)
PTT Lupus Anticoagulant: 32 s (ref 0.0–51.9)

## 2021-04-13 LAB — SJOGRENS SYNDROME-A EXTRACTABLE NUCLEAR ANTIBODY: SSA (Ro) (ENA) Antibody, IgG: <0.2 AI (ref 0.0–0.9)

## 2021-04-13 LAB — VITAMIN B12: Vitamin B-12: 88 pg/mL — ABNORMAL LOW (ref 180–914)

## 2021-04-13 LAB — SEDIMENTATION RATE: Sed Rate: 10 mm/hr (ref 0–22)

## 2021-04-13 LAB — HOMOCYSTEINE: Homocysteine: 13.2 umol/L (ref 0.0–14.5)

## 2021-04-13 LAB — C-REACTIVE PROTEIN: CRP: 0.6 mg/dL (ref <1.0)

## 2021-04-13 LAB — SJOGRENS SYNDROME-B EXTRACTABLE NUCLEAR ANTIBODY: SSB (La) (ENA) Antibody, IgG: <0.2 AI (ref 0.0–0.9)

## 2021-04-13 LAB — RPR: RPR Ser Ql: NONREACTIVE

## 2021-04-13 LAB — PROTEIN S ACTIVITY: Protein S Activity: 76 % (ref 63–140)

## 2021-04-13 MED ORDER — VERAPAMIL HCL 2.5 MG/ML IV SOLN
INTRAVENOUS | Status: AC
  Filled 2021-04-13: qty 2

## 2021-04-13 MED ORDER — ATORVASTATIN CALCIUM 10 MG PO TABS
20.0000 mg | ORAL_TABLET | Freq: Every day | ORAL | Status: DC
  Administered 2021-04-13: 20 mg via ORAL
  Filled 2021-04-13: qty 2

## 2021-04-13 MED ORDER — VERAPAMIL HCL 2.5 MG/ML IV SOLN
INTRAVENOUS | Status: AC | PRN
  Administered 2021-04-13: 5 mg via INTRAVENOUS

## 2021-04-13 MED ORDER — IOHEXOL 300 MG/ML  SOLN
50.0000 mL | Freq: Once | INTRAMUSCULAR | Status: AC | PRN
  Administered 2021-04-13: 25 mL via INTRA_ARTERIAL

## 2021-04-13 MED ORDER — LIDOCAINE HCL (PF) 1 % IJ SOLN
INTRAMUSCULAR | Status: AC | PRN
  Administered 2021-04-13: 1 mL

## 2021-04-13 MED ORDER — NITROGLYCERIN 1 MG/10 ML FOR IR/CATH LAB
INTRA_ARTERIAL | Status: AC
  Filled 2021-04-13: qty 10

## 2021-04-13 MED ORDER — MIDAZOLAM HCL 2 MG/2ML IJ SOLN
INTRAMUSCULAR | Status: AC
  Filled 2021-04-13: qty 2

## 2021-04-13 MED ORDER — FENTANYL CITRATE (PF) 100 MCG/2ML IJ SOLN
INTRAMUSCULAR | Status: AC
  Filled 2021-04-13: qty 2

## 2021-04-13 MED ORDER — FENTANYL CITRATE (PF) 100 MCG/2ML IJ SOLN
INTRAMUSCULAR | Status: AC | PRN
  Administered 2021-04-13 (×2): 25 ug via INTRAVENOUS

## 2021-04-13 MED ORDER — IOHEXOL 300 MG/ML  SOLN
100.0000 mL | Freq: Once | INTRAMUSCULAR | Status: AC | PRN
  Administered 2021-04-13: 50 mL via INTRA_ARTERIAL

## 2021-04-13 MED ORDER — HEPARIN SODIUM (PORCINE) 1000 UNIT/ML IJ SOLN
INTRAMUSCULAR | Status: AC | PRN
  Administered 2021-04-13: 5000 [IU] via INTRAVENOUS

## 2021-04-13 MED ORDER — CLOPIDOGREL BISULFATE 75 MG PO TABS
75.0000 mg | ORAL_TABLET | Freq: Every day | ORAL | Status: DC
  Administered 2021-04-13 – 2021-04-14 (×2): 75 mg via ORAL
  Filled 2021-04-13 (×2): qty 1

## 2021-04-13 MED ORDER — HEPARIN SODIUM (PORCINE) 1000 UNIT/ML IJ SOLN
INTRAMUSCULAR | Status: AC
  Filled 2021-04-13: qty 1

## 2021-04-13 MED ORDER — LIDOCAINE HCL 1 % IJ SOLN
INTRAMUSCULAR | Status: AC
  Filled 2021-04-13: qty 20

## 2021-04-13 MED ORDER — MIDAZOLAM HCL 2 MG/2ML IJ SOLN
INTRAMUSCULAR | Status: AC | PRN
  Administered 2021-04-13: 1 mg via INTRAVENOUS

## 2021-04-13 MED ORDER — CYANOCOBALAMIN 1000 MCG/ML IJ SOLN
1000.0000 ug | Freq: Every day | INTRAMUSCULAR | Status: DC
  Administered 2021-04-13 – 2021-04-14 (×2): 1000 ug via INTRAMUSCULAR
  Filled 2021-04-13 (×2): qty 1

## 2021-04-13 MED ORDER — NITROGLYCERIN 1 MG/10 ML FOR IR/CATH LAB
INTRA_ARTERIAL | Status: AC | PRN
  Administered 2021-04-13 (×2): 200 ug via INTRA_ARTERIAL

## 2021-04-13 NOTE — Consult Note (Signed)
Chief Complaint: Patient was seen in consultation today for stroke/diagnostic cerebral arteriogram.  Referring Physician(s): Gordy Councilman (neurology)  Supervising Physician: Baldemar Lenis  Patient Status: [facility] - In-pt  History of Present Illness: Meghan Williamson is a 20 y.o. female with a past medical history of asthma and anxiety. She presented to [facility] ED 04/11/2021 secondary to speech difficulty. She was transferred and admitted to [facility] for stroke work-up. Further imaging revealed acute multifocal CVAs (largest in the left MCA territory). Neurology was consulted- etiology presumed embolic versus vasculitis. LP obtained 04/12/2021, results pending.   MRA head/neck 04/12/2021: 1. Question subtle diffuse small vessel irregularity about the intracranial circulation. Differential considerations include early atherosclerotic disease versus changes of vasculopathy. 2. Otherwise normal MRA appearance of the medium and large intracranial arterial vessels. No large vessel occlusion. No hemodynamically significant or correctable stenosis. 3. Question 2 mm focal outpouching arising from the anterior communicating artery complex, which could reflect focal vascular tortuosity versus a small aneurysm. Attention at follow-up recommended. 4. Normal MRA of the neck. No hemodynamically significant stenosis or other acute vascular abnormality. 5. Aberrant right subclavian artery.  MR brain 04/12/2021: 1. 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 Possible minimal petechial hemorrhage without hemorrhagic transformation or mass effect. 2. Additional multifocal acute ischemic infarcts involving the right frontal lobe, right basal ganglia, right temporoccipital region, and cerebellum as above. No other associated hemorrhage or mass effect. 3. Underlying remote lacunar infarcts involving the left thalamus and cerebellum. Patchy T2/FLAIR signal  abnormality involving the periventricular and deep white matter both cerebral hemispheres most characteristic of chronic microvascular ischemic disease. Changes are certainly advanced for patient age. Possible vasculitis would be the primary differential consideration given patient age and the presence of multiple acute and chronic infarcts.  CT head 04/11/2021: 1. Normal unenhanced CT scan of the brain.  NIR consulted by Dr. Iver Nestle for possible image-guided diagnostic cerebral arteriogram for further evaluation of causes of CVAs. Patient awake and alert laying in bed. Mother at bedside. Still with speech difficulty (mainly expressive), improved since admission. Denies fever, chills, chest pain, dyspnea, abdominal pain, or headache.  On Aspirin.   Past Medical History:  Diagnosis Date  . Anxiety   . Asthma     History reviewed. No pertinent surgical history.  Allergies: Patient has no known allergies.  Medications: Prior to Admission medications   Medication Sig Start Date End Date Taking? Authorizing Provider  ibuprofen (ADVIL) 200 MG tablet Take 400 mg by mouth every 6 (six) hours as needed for mild pain.   Yes [provider]  SRONYX 0.1-20 MG-MCG tablet Take 1 tablet by mouth daily. 03/19/21  Yes [provider]     History reviewed. No pertinent family history.  Social History   Socioeconomic History  . Marital status: Single    Spouse name: Not on file  . Number of children: Not on file  . Years of education: Not on file  . Highest education level: Not on file  Occupational History  . Not on file  Tobacco Use  . Smoking status: Current Some Day Smoker  . Smokeless tobacco: Never Used  Vaping Use  . Vaping Use: Some days  Substance and Sexual Activity  . Alcohol use: Never  . Drug use: Yes    Types: Marijuana  . Sexual activity: Not on file  Other Topics Concern  . Not on file  Social History Narrative  . Not on file  Social Determinants  of Health   Financial Resource Strain: Not on file  Food Insecurity: Not on file  Transportation Needs: Not on file  Physical Activity: Not on file  Stress: Not on file  Social Connections: Not on file     Review of Systems: A 12 point ROS discussed and pertinent positives are indicated in the HPI above.  All other systems are negative.  Review of Systems  Constitutional: Negative for chills and fever.  Respiratory: Negative for shortness of breath and wheezing.   Cardiovascular: Negative for chest pain and palpitations.  Gastrointestinal: Negative for abdominal pain.  Neurological: Positive for speech difficulty. Negative for headaches.  Psychiatric/Behavioral: Negative for behavioral problems and confusion.    Vital Signs: BP 132/88 (BP Location: Right Arm)   Pulse (!) 49   Temp 98 F (36.7 C)   Resp 18   Ht  (1.676 m)   Wt 131 lb 13.4 oz (59.8 kg)   SpO2 100%   BMI 21.28 kg/m   Physical Exam Vitals and nursing note reviewed.  Constitutional:      General: She is not in acute distress. Cardiovascular:     Rate and Rhythm: Normal rate and regular rhythm.     Heart sounds: Normal heart sounds. No murmur heard.   Pulmonary:     Effort: Pulmonary effort is normal. No respiratory distress.     Breath sounds: Normal breath sounds. No wheezing.  Skin:    General: Skin is warm and dry.  Neurological:     Mental Status: She is alert and oriented to person, place, and time.      MD Evaluation Airway: WNL Heart: WNL Abdomen: WNL Chest/ Lungs: WNL ASA  Classification: 3 Mallampati/Airway Score: Two   Imaging: CT HEAD WO CONTRAST  Result Date: 04/11/2021 CLINICAL DATA:  Pt arrives to ED with c/o an expressive aphasic episode today starting at 1pm where she could not speak words. Pt states this episode may of lasted a few minutes. Pt reports she started a new job and it requires he to talk to a lot of new people. Pt does reports headache before the event and  headache yesterday after twist her neck and hearing a "poping" noise EXAM: CT HEAD WITHOUT CONTRAST TECHNIQUE: Contiguous axial images were obtained from the base of the skull through the vertex without intravenous contrast. COMPARISON:  None. FINDINGS: Brain: No evidence of acute infarction, hemorrhage, hydrocephalus, extra-axial collection or mass lesion/mass effect. Vascular: No hyperdense vessel or unexpected calcification. Skull: Normal. Negative for fracture or focal lesion. Sinuses/Orbits: Visualized globes and orbits are unremarkable. Visualized sinuses are clear. Other: None. IMPRESSION: Normal unenhanced CT scan of the brain. Electronically Signed   By: Amie Portland M.D.   On: 04/11/2021 14:49   MR ANGIO HEAD WO CONTRAST  Result Date: 04/12/2021 CLINICAL DATA:  Initial evaluation for acute dysarthria. EXAM: MRI HEAD WITHOUT CONTRAST MRA HEAD WITHOUT CONTRAST MRA NECK WITHOUT AND WITH CONTRAST TECHNIQUE: Multiplanar, multi-echo pulse sequences of the brain and surrounding structures were acquired without intravenous contrast. Angiographic images of the Circle of Willis were acquired using MRA technique without intravenous contrast. Angiographic images of the neck were acquired using MRA technique without and with intravenous contrast. Carotid stenosis measurements (when applicable) are obtained utilizing NASCET criteria, using the distal internal carotid diameter as the denominator. CONTRAST:  6mL GADAVIST GADOBUTROL 1 MMOL/ML IV SOLN COMPARISON:  Prior head CT from 04/11/2021. FINDINGS: MRI HEAD FINDINGS Brain: Cerebral volume within normal limits. Few scattered  foci of patchy T2/FLAIR signal abnormality seen involving the periventricular and deep white matter both cerebral hemispheres, nonspecific, but suspected to reflect chronic microvascular ischemic disease, definitely advanced for age. Remote lacunar infarct present at the ventral medial left thalamus. A few tiny remote bilateral cerebellar  infarcts noted. Patchy and confluent restricted diffusion involving primarily the cortex of the left temporal occipital and parietal region, consistent with acute ischemic infarct. A few suspected subtle foci of susceptibility artifact within the area of infarction, suggestive of minimal petechial hemorrhage. No frank hemorrhagic transformation or mass effect. There are additional scattered subcentimeter foci of restricted diffusion involving the right basal ganglia (series 5, images 79, 78). Patchy right PCA distribution infarcts involving the right occipital lobe and right hippocampus (series 5, images 71, 70). Few punctate bilateral cerebellar infarcts (series 5, images 60, 66 additional punctate right frontal cortical infarct (series 5, image 88). No other associated hemorrhage or mass effect. No mass lesion or midline shift. No hydrocephalus or extra-axial fluid collection. Pituitary gland within normal limits for age. Midline structures intact. Vascular: Major intracranial vascular flow voids are maintained. Skull and upper cervical spine: Craniocervical junction within normal limits. Bone marrow signal intensity normal. No scalp soft tissue abnormality. Sinuses/Orbits: Globes and orbital soft tissues within normal limits. Paranasal sinuses are clear. No significant mastoid effusion. Inner ear structures grossly normal. Other: None. MRA HEAD FINDINGS ANTERIOR CIRCULATION: Visualized distal cervical segments of the internal carotid arteries are patent with antegrade flow. Petrous, cavernous, and supraclinoid segments patent without stenosis or other abnormality. A1 segments patent. Question 2 mm focal outpouching extending inferiorly from the anterior communicating artery complex (series 1037, image 8). Anterior cerebral arteries patent to their distal aspects without stenosis. No M1 stenosis or occlusion. Normal MCA bifurcations. MCA branches well perfused and symmetric. There is question of diffuse small  vessel irregularity about the MCA branches bilaterally, best seen at the proximal left M2 anterior division (series 1037, image 8). POSTERIOR CIRCULATION: Both vertebral arteries patent to the vertebrobasilar junction without stenosis. Left vertebral artery slightly dominant. Both PICA origins patent and normal. Basilar patent to its distal aspect without stenosis. Superior cerebellar arteries patent bilaterally. Both PCAs supplied via the basilar as well as small bilateral posterior communicating arteries. PCAs well perfused to their distal aspects without proximal stenosis. Again there is question of possible subtle distal small vessel irregularity. No intracranial aneurysm or other vascular abnormality. MRA NECK FINDINGS AORTIC ARCH: Visualized aortic arch normal in caliber. Aberrant right subclavian artery noted. Right CCA arises directly from the aortic arch. No significant vascular irregularity or stenosis about the origin of the great vessels. RIGHT CAROTID SYSTEM: Right common and internal carotid arteries widely patent without stenosis, evidence for dissection, or occlusion. LEFT CAROTID SYSTEM: Left common and internal carotid arteries widely patent without stenosis, evidence for dissection, or occlusion. VERTEBRAL ARTERIES: Both vertebral arteries arise from the subclavian arteries. Left vertebral artery dominant. Vertebral arteries patent without stenosis, evidence for dissection, or occlusion. IMPRESSION: MRI HEAD: 1. 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 Possible minimal petechial hemorrhage without hemorrhagic transformation or mass effect. 2. Additional multifocal acute ischemic infarcts involving the right frontal lobe, right basal ganglia, right temporoccipital region, and cerebellum as above. No other associated hemorrhage or mass effect. 3. Underlying remote lacunar infarcts involving the left thalamus and cerebellum. Patchy T2/FLAIR signal  abnormality involving the periventricular and deep white matter both cerebral hemispheres most characteristic of chronic microvascular ischemic disease. Changes are certainly  advanced for patient age. Possible vasculitis would be the primary differential consideration given patient age and the presence of multiple acute and chronic infarcts. MRA HEAD: 1. Question subtle diffuse small vessel irregularity about the intracranial circulation. Differential considerations include early atherosclerotic disease versus changes of vasculopathy. 2. Otherwise normal MRA appearance of the medium and large intracranial arterial vessels. No large vessel occlusion. No hemodynamically significant or correctable stenosis. 3. Question 2 mm focal outpouching arising from the anterior communicating artery complex, which could reflect focal vascular tortuosity versus a small aneurysm. Attention at follow-up recommended. MRA NECK: 1. Normal MRA of the neck. No hemodynamically significant stenosis or other acute vascular abnormality. 2. Aberrant right subclavian artery. Electronically Signed   By: Rise Mu M.D.   On: 04/12/2021 05:48   MR ANGIO NECK W WO CONTRAST  Result Date: 04/12/2021 CLINICAL DATA:  Initial evaluation for acute dysarthria. EXAM: MRI HEAD WITHOUT CONTRAST MRA HEAD WITHOUT CONTRAST MRA NECK WITHOUT AND WITH CONTRAST TECHNIQUE: Multiplanar, multi-echo pulse sequences of the brain and surrounding structures were acquired without intravenous contrast. Angiographic images of the Circle of Willis were acquired using MRA technique without intravenous contrast. Angiographic images of the neck were acquired using MRA technique without and with intravenous contrast. Carotid stenosis measurements (when applicable) are obtained utilizing NASCET criteria, using the distal internal carotid diameter as the denominator. CONTRAST:  6mL GADAVIST GADOBUTROL 1 MMOL/ML IV SOLN COMPARISON:  Prior head CT from 04/11/2021.  FINDINGS: MRI HEAD FINDINGS Brain: Cerebral volume within normal limits. Few scattered foci of patchy T2/FLAIR signal abnormality seen involving the periventricular and deep white matter both cerebral hemispheres, nonspecific, but suspected to reflect chronic microvascular ischemic disease, definitely advanced for age. Remote lacunar infarct present at the ventral medial left thalamus. A few tiny remote bilateral cerebellar infarcts noted. Patchy and confluent restricted diffusion involving primarily the cortex of the left temporal occipital and parietal region, consistent with acute ischemic infarct. A few suspected subtle foci of susceptibility artifact within the area of infarction, suggestive of minimal petechial hemorrhage. No frank hemorrhagic transformation or mass effect. There are additional scattered subcentimeter foci of restricted diffusion involving the right basal ganglia (series 5, images 79, 78). Patchy right PCA distribution infarcts involving the right occipital lobe and right hippocampus (series 5, images 71, 70). Few punctate bilateral cerebellar infarcts (series 5, images 60, 66 additional punctate right frontal cortical infarct (series 5, image 88). No other associated hemorrhage or mass effect. No mass lesion or midline shift. No hydrocephalus or extra-axial fluid collection. Pituitary gland within normal limits for age. Midline structures intact. Vascular: Major intracranial vascular flow voids are maintained. Skull and upper cervical spine: Craniocervical junction within normal limits. Bone marrow signal intensity normal. No scalp soft tissue abnormality. Sinuses/Orbits: Globes and orbital soft tissues within normal limits. Paranasal sinuses are clear. No significant mastoid effusion. Inner ear structures grossly normal. Other: None. MRA HEAD FINDINGS ANTERIOR CIRCULATION: Visualized distal cervical segments of the internal carotid arteries are patent with antegrade flow. Petrous, cavernous,  and supraclinoid segments patent without stenosis or other abnormality. A1 segments patent. Question 2 mm focal outpouching extending inferiorly from the anterior communicating artery complex (series 1037, image 8). Anterior cerebral arteries patent to their distal aspects without stenosis. No M1 stenosis or occlusion. Normal MCA bifurcations. MCA branches well perfused and symmetric. There is question of diffuse small vessel irregularity about the MCA branches bilaterally, best seen at the proximal left M2 anterior division (series 1037, image 8). POSTERIOR CIRCULATION: Both vertebral  arteries patent to the vertebrobasilar junction without stenosis. Left vertebral artery slightly dominant. Both PICA origins patent and normal. Basilar patent to its distal aspect without stenosis. Superior cerebellar arteries patent bilaterally. Both PCAs supplied via the basilar as well as small bilateral posterior communicating arteries. PCAs well perfused to their distal aspects without proximal stenosis. Again there is question of possible subtle distal small vessel irregularity. No intracranial aneurysm or other vascular abnormality. MRA NECK FINDINGS AORTIC ARCH: Visualized aortic arch normal in caliber. Aberrant right subclavian artery noted. Right CCA arises directly from the aortic arch. No significant vascular irregularity or stenosis about the origin of the great vessels. RIGHT CAROTID SYSTEM: Right common and internal carotid arteries widely patent without stenosis, evidence for dissection, or occlusion. LEFT CAROTID SYSTEM: Left common and internal carotid arteries widely patent without stenosis, evidence for dissection, or occlusion. VERTEBRAL ARTERIES: Both vertebral arteries arise from the subclavian arteries. Left vertebral artery dominant. Vertebral arteries patent without stenosis, evidence for dissection, or occlusion. IMPRESSION: MRI HEAD: 1. Acute ischemic cortical infarct involving the left temporal occipital  and parietal region. Possible minimal petechial hemorrhage without hemorrhagic transformation or mass effect. 2. Additional multifocal acute ischemic infarcts involving the right frontal lobe, right basal ganglia, right temporoccipital region, and cerebellum as above. No other associated hemorrhage or mass effect. 3. Underlying remote lacunar infarcts involving the left thalamus and cerebellum. 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 Changes are certainly advanced for patient age. Possible vasculitis would be the primary differential consideration given patient age and the presence of multiple acute and chronic infarcts. MRA HEAD: 1. Question subtle diffuse small vessel irregularity about the intracranial circulation. Differential considerations include early atherosclerotic disease versus changes of vasculopathy. 2. Otherwise normal MRA appearance of the medium and large intracranial arterial vessels. No large vessel occlusion. No hemodynamically significant or correctable stenosis. 3. Question 2 mm focal outpouching arising from the anterior communicating artery complex, which could reflect focal vascular tortuosity versus a small aneurysm. Attention at follow-up recommended. MRA NECK: 1. Normal MRA of the neck. No hemodynamically significant stenosis or other acute vascular abnormality. 2. Aberrant right subclavian artery. Electronically Signed   By: Rise Mu M.D.   On: 04/12/2021 05:48   MR BRAIN WO CONTRAST  Result Date: 04/12/2021 CLINICAL DATA:  Initial evaluation for acute dysarthria. EXAM: MRI HEAD WITHOUT CONTRAST MRA HEAD WITHOUT CONTRAST MRA NECK WITHOUT AND WITH CONTRAST TECHNIQUE: Multiplanar, multi-echo pulse sequences of the brain and surrounding structures were acquired without intravenous contrast. Angiographic images of the Circle of Willis were  acquired using MRA technique without intravenous contrast. Angiographic images of the neck were acquired using MRA technique without and with intravenous contrast. Carotid stenosis measurements (when applicable) are obtained utilizing NASCET criteria, using the distal internal carotid diameter as the denominator. CONTRAST:  40mL GADAVIST GADOBUTROL 1 MMOL/ML IV SOLN COMPARISON:  Prior head CT from 04/11/2021. FINDINGS: MRI HEAD FINDINGS Brain: Cerebral volume within normal limits. Few scattered foci of patchy T2/FLAIR signal abnormality seen involving the periventricular and deep white matter both cerebral hemispheres, nonspecific, but suspected to reflect chronic microvascular ischemic disease, definitely advanced for age. Remote lacunar infarct present at the ventral medial left thalamus. A few tiny remote bilateral cerebellar infarcts noted. Patchy and confluent restricted diffusion involving primarily the cortex of the left temporal occipital and parietal region, consistent with acute ischemic infarct. A few suspected subtle foci of susceptibility artifact within  the area of infarction, suggestive of minimal petechial hemorrhage. No frank hemorrhagic transformation or mass effect. There are additional scattered subcentimeter foci of restricted diffusion involving the right basal ganglia (series 5, images 79, 78). Patchy right PCA distribution infarcts involving the right occipital lobe and right hippocampus (series 5, images 71, 70). Few punctate bilateral cerebellar infarcts (series 5, images 60, 66 additional punctate right frontal cortical infarct (series 5, image 88). No other associated hemorrhage or mass effect. No mass lesion or midline shift. No hydrocephalus or extra-axial fluid collection. Pituitary gland within normal limits for age. Midline structures intact. Vascular: Major intracranial vascular flow voids are maintained. Skull and upper cervical spine: Craniocervical junction within normal limits.  Bone marrow signal intensity normal. No scalp soft tissue abnormality. Sinuses/Orbits: Globes and orbital soft tissues within normal limits. Paranasal sinuses are clear. No significant mastoid effusion. Inner ear structures grossly normal. Other: None. MRA HEAD FINDINGS ANTERIOR CIRCULATION: Visualized distal cervical segments of the internal carotid arteries are patent with antegrade flow. Petrous, cavernous, and supraclinoid segments patent without stenosis or other abnormality. A1 segments patent. Question 2 mm focal outpouching extending inferiorly from the anterior communicating artery complex (series 1037, image 8). Anterior cerebral arteries patent to their distal aspects without stenosis. No M1 stenosis or occlusion. Normal MCA bifurcations. MCA branches well perfused and symmetric. There is question of diffuse small vessel irregularity about the MCA branches bilaterally, best seen at the proximal left M2 anterior division (series 1037, image 8). POSTERIOR CIRCULATION: Both vertebral arteries patent to the vertebrobasilar junction without stenosis. Left vertebral artery slightly dominant. Both PICA origins patent and normal. Basilar patent to its distal aspect without stenosis. Superior cerebellar arteries patent bilaterally. Both PCAs supplied via the basilar as well as small bilateral posterior communicating arteries. PCAs well perfused to their distal aspects without proximal stenosis. Again there is question of possible subtle distal small vessel irregularity. No intracranial aneurysm or other vascular abnormality. MRA NECK FINDINGS AORTIC ARCH: Visualized aortic arch normal in caliber. Aberrant right subclavian artery noted. Right CCA arises directly from the aortic arch. No significant vascular irregularity or stenosis about the origin of the great vessels. RIGHT CAROTID SYSTEM: Right common and internal carotid arteries widely patent without stenosis, evidence for dissection, or occlusion. LEFT  CAROTID SYSTEM: Left common and internal carotid arteries widely patent without stenosis, evidence for dissection, or occlusion. VERTEBRAL ARTERIES: Both vertebral arteries arise from the subclavian arteries. Left vertebral artery dominant. Vertebral arteries patent without stenosis, evidence for dissection, or occlusion. IMPRESSION: MRI HEAD: 1. 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 Possible minimal petechial hemorrhage without hemorrhagic transformation or mass effect. 2. Additional multifocal acute ischemic infarcts involving the right frontal lobe, right basal ganglia, right temporoccipital region, and cerebellum as above. No other associated hemorrhage or mass effect. 3. Underlying remote lacunar infarcts involving the left thalamus and cerebellum. 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 Changes are certainly advanced for patient age. Possible vasculitis would be the primary differential consideration given patient age and the presence of multiple acute and chronic infarcts. MRA HEAD: 1. Question subtle diffuse small vessel irregularity about the intracranial circulation. Differential considerations include early atherosclerotic disease versus changes of vasculopathy. 2. Otherwise normal MRA appearance of the medium and large intracranial arterial vessels. No large vessel occlusion. No hemodynamically significant or correctable stenosis. 3. Question 2 mm focal outpouching arising from the anterior  communicating artery complex, which could reflect focal vascular tortuosity versus a small aneurysm. Attention at follow-up recommended. MRA NECK: 1. Normal MRA of the neck. No hemodynamically significant stenosis or other acute vascular abnormality. 2. Aberrant right subclavian artery. Electronically Signed   By: Rise MuBenjamin   McClintock M.D.   On: 04/12/2021 05:48   EEG adult  Result Date: 04/12/2021 Charlsie QuestYadav, Priyanka O, MD     04/12/2021 12:34 PM Patient Name: Meghan Williamson MRN: [id number] Epilepsy Attending: Charlsie QuestPriyanka O Yadav Referring Physician/Provider: Dr Brooke DareSrishti Bhagat Date: 04/12/2021 Duration: 27.42 mins Patient history: 20 year old female with no significant medical history pertaining to strokes presented for evaluation of expressive aphasia and was found to have left temporal, occipital, parietal region acute ischemic infarcts.   EEG to evaluate for seizures. Level of alertness: Awake AEDs during EEG study: None Technical aspects: This EEG study was done with scalp electrodes positioned according to the 10-20 International system of electrode placement. Electrical activity was acquired at a sampling rate of 500Hz  and reviewed with a high frequency filter of 70Hz  and a low frequency filter of 1Hz . EEG data were recorded continuously and digitally stored. Description: The posterior dominant rhythm consists of 9-10 Hz activity of moderate voltage (25-35 uV) seen predominantly in posterior head regions, symmetric and reactive to eye opening and eye closing. EEG showed continuous 3 to 6 Hz theta-delta slowing in left hemisphere, maximal left temporal region which at times appears rhythmic. Hyperventilation and photic stimulation were not performed.   ABNORMALITY -Continuous slow, left hemisphere, maximal left temporal region IMPRESSION: This study is suggestive of cortical dysfunction left hemisphere, maximal left temporal region likely secondary to underlying stroke.  No seizures or definite epileptiform discharges were seen throughout the recording. 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 Plan for image-guided diagnostic cerebral arteriogram today in IR with Dr. Tommie Samsde Macedo Rodrigues pending IR scheduling. Patient is NPO. Afebrile and WBCs WNL. Ok to proceed with Aspirin use per Dr. Tommie Samsde Macedo Rodrigues. INR 1.1 04/11/2021.  Risks and benefits of diagnostic cerebral angiogram were discussed with the patient including, but not limited to bleeding, infection, vascular injury, stroke, or contrast induced renal failure. This interventional procedure involves the use of X-rays and because of the nature of the planned procedure, it is possible that we will have prolonged use of X-ray fluoroscopy. Potential radiation risks to you include (but are not limited to) the following: - A slightly elevated risk for cancer  several years later in life. This risk is typically less than 0.5% percent. This risk is low in comparison to the normal incidence of human cancer, which is 33% for women and 50% for men according to the [employer]. - Radiation induced injury can include skin redness, resembling a rash,  tissue breakdown / ulcers and hair loss (which can be temporary or permanent).  The likelihood of either of these occurring depends on the difficulty of the procedure and whether you are sensitive to radiation due to previous procedures, disease, or genetic conditions.  IF your procedure requires a prolonged use of radiation, you will be notified and given written instructions for further action.  It is your responsibility to monitor the irradiated area for the 2 weeks following the procedure and to notify your physician if you are concerned that you have suffered a radiation induced injury.   All of the  patient's questions were answered, patient is agreeable to proceed. Consent signed and in IR control room.   Thank you for this interesting consult.  I greatly enjoyed meeting Miniya Miguez and look forward to participating in their care.  A copy of this report was sent to the requesting provider on this date.  Electronically Signed: Elwin Mocha, PA-C 04/13/2021, 8:59 AM   I spent a total of 20 Minutes in face to face in clinical consultation, greater than 50% of which was counseling/coordinating care for stroke/diagnostic cerebral arteriogram.

## 2021-04-13 NOTE — Progress Notes (Signed)
Lower extremity venous has been completed.   Preliminary results in CV Proc.   Blanch Media 04/13/2021 2:36 PM

## 2021-04-13 NOTE — Plan of Care (Signed)
  Problem: Education: Goal: Knowledge of General Education information will improve Description: Including pain rating scale, medication(s)/side effects and non-pharmacologic comfort measures 04/13/2021 1047 by Mamie Nick I, RN Outcome: Progressing 04/13/2021 1044 by Mamie Nick I, RN Outcome: Progressing   Problem: Health Behavior/Discharge Planning: Goal: Ability to manage health-related needs will improve 04/13/2021 1047 by Mamie Nick I, RN Outcome: Progressing 04/13/2021 1044 by Mamie Nick I, RN Outcome: Progressing   Problem: Clinical Measurements: Goal: Ability to maintain clinical measurements within normal limits will improve 04/13/2021 1047 by Mamie Nick I, RN Outcome: Progressing 04/13/2021 1044 by Mamie Nick I, RN Outcome: Progressing Goal: Will remain free from infection 04/13/2021 1047 by Mamie Nick I, RN Outcome: Progressing 04/13/2021 1044 by Mamie Nick I, RN Outcome: Progressing Goal: Diagnostic test results will improve 04/13/2021 1047 by Mamie Nick I, RN Outcome: Progressing 04/13/2021 1044 by Mamie Nick I, RN Outcome: Progressing Goal: Respiratory complications will improve 04/13/2021 1047 by Mamie Nick I, RN Outcome: Progressing 04/13/2021 1044 by Mamie Nick I, RN Outcome: Progressing Goal: Cardiovascular complication will be avoided 04/13/2021 1047 by Mamie Nick I, RN Outcome: Progressing 04/13/2021 1044 by Mamie Nick I, RN Outcome: Progressing   Problem: Activity: Goal: Risk for activity intolerance will decrease 04/13/2021 1047 by Mamie Nick I, RN Outcome: Progressing 04/13/2021 1044 by Mamie Nick I, RN Outcome: Progressing   Problem: Nutrition: Goal: Adequate nutrition will be maintained 04/13/2021 1047 by Mamie Nick I, RN Outcome: Progressing 04/13/2021 1044 by Mamie Nick I, RN Outcome: Progressing   Problem: Coping: Goal: Level  of anxiety will decrease 04/13/2021 1047 by Mamie Nick I, RN Outcome: Progressing 04/13/2021 1044 by Mamie Nick I, RN Outcome: Progressing   Problem: Elimination: Goal: Will not experience complications related to bowel motility 04/13/2021 1047 by Mamie Nick I, RN Outcome: Progressing 04/13/2021 1044 by Mamie Nick I, RN Outcome: Progressing Goal: Will not experience complications related to urinary retention 04/13/2021 1047 by Mamie Nick I, RN Outcome: Progressing 04/13/2021 1044 by Mamie Nick I, RN Outcome: Progressing   Problem: Pain Managment: Goal: General experience of comfort will improve 04/13/2021 1047 by Mamie Nick I, RN Outcome: Progressing 04/13/2021 1044 by Mamie Nick I, RN Outcome: Progressing   Problem: Safety: Goal: Ability to remain free from injury will improve 04/13/2021 1047 by Mamie Nick I, RN Outcome: Progressing 04/13/2021 1044 by Mamie Nick I, RN Outcome: Progressing   Problem: Skin Integrity: Goal: Risk for impaired skin integrity will decrease 04/13/2021 1047 by Mamie Nick I, RN Outcome: Progressing 04/13/2021 1044 by Mamie Nick I, RN Outcome: Progressing   Problem: Education: Goal: Knowledge of disease or condition will improve 04/13/2021 1047 by Mamie Nick I, RN Outcome: Progressing 04/13/2021 1044 by Mamie Nick I, RN Outcome: Progressing   Problem: Self-Care: Goal: Ability to participate in self-care as condition permits will improve 04/13/2021 1047 by Mamie Nick I, RN Outcome: Progressing 04/13/2021 1044 by Mamie Nick I, RN Outcome: Progressing

## 2021-04-13 NOTE — H&P (View-Only) (Signed)
STROKE TEAM PROGRESS NOTE   SUBJECTIVE (INTERVAL HISTORY) Her parents are at the bedside.  Overall her condition is gradually improving.  She does have cerebral angiogram with Dr. Rodriguez, which was negative for vasculitis.  LP done yesterday showed no evidence of inflammation or infection.  LE venous Doppler negative for DVT, 2D echo unremarkable.   OBJECTIVE Temp:  [98 F (36.7 C)-98.6 F (37 C)] 98 F (36.7 C) (05/31 0356) Pulse Rate:  [49-98] 62 (05/31 1537) Cardiac Rhythm: Normal sinus rhythm (05/31 1125) Resp:  [10-26] 18 (05/31 1205) BP: (114-155)/(75-102) 135/86 (05/31 1537) SpO2:  [98 %-100 %] 100 % (05/31 1537)  Recent Labs  Lab 04/11/21 1421 04/12/21 1513  GLUCAP 89 105*   Recent Labs  Lab 04/11/21 1444  NA 137  K 4.0  CL 105  CO2 24  GLUCOSE 87  BUN 10  CREATININE 0.79  CALCIUM 9.7   Recent Labs  Lab 04/11/21 1444  AST 12*  ALT 9  ALKPHOS 45  BILITOT 0.4  PROT 7.2  ALBUMIN 4.4   Recent Labs  Lab 04/11/21 1444  WBC 4.8  NEUTROABS 2.4  HGB 13.0  HCT 38.7  MCV 81.8  PLT 266   No results for input(s): CKTOTAL, CKMB, CKMBINDEX, TROPONINI in the last 168 hours. Recent Labs    04/11/21 1444  LABPROT 14.0  INR 1.1   Recent Labs    04/11/21 1444  COLORURINE COLORLESS*  LABSPEC 1.009  PHURINE 7.5  GLUCOSEU NEGATIVE  HGBUR MODERATE*  BILIRUBINUR NEGATIVE  KETONESUR NEGATIVE  PROTEINUR NEGATIVE  NITRITE NEGATIVE  LEUKOCYTESUR NEGATIVE       Component Value Date/Time   CHOL 151 04/12/2021 0147   TRIG 110 04/12/2021 0147   HDL 40 (L) 04/12/2021 0147   CHOLHDL 3.8 04/12/2021 0147   VLDL 22 04/12/2021 0147   LDLCALC 89 04/12/2021 0147   Lab Results  Component Value Date   HGBA1C QNSTST 04/12/2021      Component Value Date/Time   LABOPIA NONE DETECTED 04/11/2021 1444   COCAINSCRNUR NONE DETECTED 04/11/2021 1444   LABBENZ NONE DETECTED 04/11/2021 1444   AMPHETMU NONE DETECTED 04/11/2021 1444   THCU POSITIVE (A) 04/11/2021 1444    LABBARB NONE DETECTED 04/11/2021 1444    Recent Labs  Lab 04/11/21 1444  ETH <10    I have personally reviewed the radiological images below and agree with the radiology interpretations.  CT HEAD WO CONTRAST  Result Date: 04/11/2021 CLINICAL DATA:  Pt arrives to ED with c/o an expressive aphasic episode today starting at 1pm where she could not speak words. Pt states this episode may of lasted a few minutes. Pt reports she started a new job and it requires he to talk to a lot of new people. Pt does reports headache before the event and headache yesterday after twist her neck and hearing a "poping" noise EXAM: CT HEAD WITHOUT CONTRAST TECHNIQUE: Contiguous axial images were obtained from the base of the skull through the vertex without intravenous contrast. COMPARISON:  None. FINDINGS: Brain: No evidence of acute infarction, hemorrhage, hydrocephalus, extra-axial collection or mass lesion/mass effect. Vascular: No hyperdense vessel or unexpected calcification. Skull: Normal. Negative for fracture or focal lesion. Sinuses/Orbits: Visualized globes and orbits are unremarkable. Visualized sinuses are clear. Other: None. IMPRESSION: Normal unenhanced CT scan of the brain. Electronically Signed   By: David  Ormond M.D.   On: 04/11/2021 14:49   MR ANGIO HEAD WO CONTRAST  Result Date: 04/12/2021 CLINICAL DATA:  Initial evaluation for   acute dysarthria. EXAM: MRI HEAD WITHOUT CONTRAST MRA HEAD WITHOUT CONTRAST MRA NECK WITHOUT AND WITH CONTRAST TECHNIQUE: Multiplanar, multi-echo pulse sequences of the brain and surrounding structures were acquired without intravenous contrast. Angiographic images of the Circle of Willis were acquired using MRA technique without intravenous contrast. Angiographic images of the neck were acquired using MRA technique without and with intravenous contrast. Carotid stenosis measurements (when applicable) are obtained utilizing NASCET criteria, using the distal internal carotid  diameter as the denominator. CONTRAST:  6mL GADAVIST GADOBUTROL 1 MMOL/ML IV SOLN COMPARISON:  Prior head CT from 04/11/2021. FINDINGS: MRI HEAD FINDINGS Brain: Cerebral volume within normal limits. Few scattered foci of patchy T2/FLAIR signal abnormality seen involving the periventricular and deep white matter both cerebral hemispheres, nonspecific, but suspected to reflect chronic microvascular ischemic disease, definitely advanced for age. Remote lacunar infarct present at the ventral medial left thalamus. A few tiny remote bilateral cerebellar infarcts noted. Patchy and confluent restricted diffusion involving primarily the cortex of the left temporal occipital and parietal region, consistent with acute ischemic infarct. A few suspected subtle foci of susceptibility artifact within the area of infarction, suggestive of minimal petechial hemorrhage. No frank hemorrhagic transformation or mass effect. There are additional scattered subcentimeter foci of restricted diffusion involving the right basal ganglia (series 5, images 79, 78). Patchy right PCA distribution infarcts involving the right occipital lobe and right hippocampus (series 5, images 71, 70). Few punctate bilateral cerebellar infarcts (series 5, images 60, 66 additional punctate right frontal cortical infarct (series 5, image 88). No other associated hemorrhage or mass effect. No mass lesion or midline shift. No hydrocephalus or extra-axial fluid collection. Pituitary gland within normal limits for age. Midline structures intact. Vascular: Major intracranial vascular flow voids are maintained. Skull and upper cervical spine: Craniocervical junction within normal limits. Bone marrow signal intensity normal. No scalp soft tissue abnormality. Sinuses/Orbits: Globes and orbital soft tissues within normal limits. Paranasal sinuses are clear. No significant mastoid effusion. Inner ear structures grossly normal. Other: None. MRA HEAD FINDINGS ANTERIOR  CIRCULATION: Visualized distal cervical segments of the internal carotid arteries are patent with antegrade flow. Petrous, cavernous, and supraclinoid segments patent without stenosis or other abnormality. A1 segments patent. Question 2 mm focal outpouching extending inferiorly from the anterior communicating artery complex (series 1037, image 8). Anterior cerebral arteries patent to their distal aspects without stenosis. No M1 stenosis or occlusion. Normal MCA bifurcations. MCA branches well perfused and symmetric. There is question of diffuse small vessel irregularity about the MCA branches bilaterally, best seen at the proximal left M2 anterior division (series 1037, image 8). POSTERIOR CIRCULATION: Both vertebral arteries patent to the vertebrobasilar junction without stenosis. Left vertebral artery slightly dominant. Both PICA origins patent and normal. Basilar patent to its distal aspect without stenosis. Superior cerebellar arteries patent bilaterally. Both PCAs supplied via the basilar as well as small bilateral posterior communicating arteries. PCAs well perfused to their distal aspects without proximal stenosis. Again there is question of possible subtle distal small vessel irregularity. No intracranial aneurysm or other vascular abnormality. MRA NECK FINDINGS AORTIC ARCH: Visualized aortic arch normal in caliber. Aberrant right subclavian artery noted. Right CCA arises directly from the aortic arch. No significant vascular irregularity or stenosis about the origin of the great vessels. RIGHT CAROTID SYSTEM: Right common and internal carotid arteries widely patent without stenosis, evidence for dissection, or occlusion. LEFT CAROTID SYSTEM: Left common and internal carotid arteries widely patent without stenosis, evidence for dissection, or occlusion. VERTEBRAL ARTERIES: Both vertebral   arteries arise from the subclavian arteries. Left vertebral artery dominant. Vertebral arteries patent without stenosis,  evidence for dissection, or occlusion. IMPRESSION: MRI HEAD: 1. 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 Possible minimal petechial hemorrhage without hemorrhagic transformation or mass effect. 2. Additional multifocal acute ischemic infarcts involving the right frontal lobe, right basal ganglia, right temporoccipital region, and cerebellum as above. No other associated hemorrhage or mass effect. 3. Underlying remote lacunar infarcts involving the left thalamus and cerebellum. 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 Changes are certainly advanced for patient age. Possible vasculitis would be the primary differential consideration given patient age and the presence of multiple acute and chronic infarcts. MRA HEAD: 1. Question subtle diffuse small vessel irregularity about the intracranial circulation. Differential considerations include early atherosclerotic disease versus changes of vasculopathy. 2. Otherwise normal MRA appearance of the medium and large intracranial arterial vessels. No large vessel occlusion. No hemodynamically significant or correctable stenosis. 3. Question 2 mm focal outpouching arising from the anterior communicating artery complex, which could reflect focal vascular tortuosity versus a small aneurysm. Attention at follow-up recommended. MRA NECK: 1. Normal MRA of the neck. No hemodynamically significant stenosis or other acute vascular abnormality. 2. Aberrant right subclavian artery. Electronically Signed   By: Jeannine Boga M.D.   On: 04/12/2021 05:48   MR ANGIO NECK W WO CONTRAST  Result Date: 04/12/2021 CLINICAL DATA:  Initial evaluation for acute dysarthria. EXAM: MRI HEAD WITHOUT CONTRAST MRA HEAD WITHOUT CONTRAST MRA NECK WITHOUT AND WITH CONTRAST TECHNIQUE: Multiplanar, multi-echo pulse  sequences of the brain and surrounding structures were acquired without intravenous contrast. Angiographic images of the Circle of Willis were acquired using MRA technique without intravenous contrast. Angiographic images of the neck were acquired using MRA technique without and with intravenous contrast. Carotid stenosis measurements (when applicable) are obtained utilizing NASCET criteria, using the distal internal carotid diameter as the denominator. CONTRAST:  65m GADAVIST GADOBUTROL 1 MMOL/ML IV SOLN COMPARISON:  Prior head CT from 04/11/2021. FINDINGS: MRI HEAD FINDINGS Brain: Cerebral volume within normal limits. Few scattered foci of patchy T2/FLAIR signal abnormality seen involving the periventricular and deep white matter both cerebral hemispheres, nonspecific, but suspected to reflect chronic microvascular ischemic disease, definitely advanced for age. Remote lacunar infarct present at the ventral medial left thalamus. A few tiny remote bilateral cerebellar infarcts noted. Patchy and confluent restricted diffusion involving primarily the cortex of the left temporal occipital and parietal region, consistent with acute ischemic infarct. A few suspected subtle foci of susceptibility artifact within the area of infarction, suggestive of minimal petechial hemorrhage. No frank hemorrhagic transformation or mass effect. There are additional scattered subcentimeter foci of restricted diffusion involving the right basal ganglia (series 5, images 79, 78). Patchy right PCA distribution infarcts involving the right occipital lobe and right hippocampus (series 5, images 71, 70). Few punctate bilateral cerebellar infarcts (series 5, images 60, 66 additional punctate right frontal cortical infarct (series 5, image 88). No other associated hemorrhage or mass effect. No mass lesion or midline shift. No hydrocephalus or extra-axial fluid collection. Pituitary gland within normal limits for age. Midline structures intact.  Vascular: Major intracranial vascular flow voids are maintained. Skull and upper cervical spine: Craniocervical junction within normal limits. Bone marrow signal intensity normal. No scalp soft tissue abnormality. Sinuses/Orbits: Globes and orbital soft tissues within normal limits. Paranasal sinuses are clear. No significant mastoid effusion. IIrena  ear structures grossly normal. Other: None. MRA HEAD FINDINGS ANTERIOR CIRCULATION: Visualized distal cervical segments of the internal carotid arteries are patent with antegrade flow. Petrous, cavernous, and supraclinoid segments patent without stenosis or other abnormality. A1 segments patent. Question 2 mm focal outpouching extending inferiorly from the anterior communicating artery complex (series 1037, image 8). Anterior cerebral arteries patent to their distal aspects without stenosis. No M1 stenosis or occlusion. Normal MCA bifurcations. MCA branches well perfused and symmetric. There is question of diffuse small vessel irregularity about the MCA branches bilaterally, best seen at the proximal left M2 anterior division (series 1037, image 8). POSTERIOR CIRCULATION: Both vertebral arteries patent to the vertebrobasilar junction without stenosis. Left vertebral artery slightly dominant. Both PICA origins patent and normal. Basilar patent to its distal aspect without stenosis. Superior cerebellar arteries patent bilaterally. Both PCAs supplied via the basilar as well as small bilateral posterior communicating arteries. PCAs well perfused to their distal aspects without proximal stenosis. Again there is question of possible subtle distal small vessel irregularity. No intracranial aneurysm or other vascular abnormality. MRA NECK FINDINGS AORTIC ARCH: Visualized aortic arch normal in caliber. Aberrant right subclavian artery noted. Right CCA arises directly from the aortic arch. No significant vascular irregularity or stenosis about the origin of the great vessels.  RIGHT CAROTID SYSTEM: Right common and internal carotid arteries widely patent without stenosis, evidence for dissection, or occlusion. LEFT CAROTID SYSTEM: Left common and internal carotid arteries widely patent without stenosis, evidence for dissection, or occlusion. VERTEBRAL ARTERIES: Both vertebral arteries arise from the subclavian arteries. Left vertebral artery dominant. Vertebral arteries patent without stenosis, evidence for dissection, or occlusion. IMPRESSION: MRI HEAD: 1. 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 Possible minimal petechial hemorrhage without hemorrhagic transformation or mass effect. 2. Additional multifocal acute ischemic infarcts involving the right frontal lobe, right basal ganglia, right temporoccipital region, and cerebellum as above. No other associated hemorrhage or mass effect. 3. Underlying remote lacunar infarcts involving the left thalamus and cerebellum. 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 Changes are certainly advanced for patient age. Possible vasculitis would be the primary differential consideration given patient age and the presence of multiple acute and chronic infarcts. MRA HEAD: 1. Question subtle diffuse small vessel irregularity about the intracranial circulation. Differential considerations include early atherosclerotic disease versus changes of vasculopathy. 2. Otherwise normal MRA appearance of the medium and large intracranial arterial vessels. No large vessel occlusion. No hemodynamically significant or correctable stenosis. 3. Question 2 mm focal outpouching arising from the anterior communicating artery complex, which could reflect focal vascular tortuosity versus a small aneurysm. Attention at follow-up recommended. MRA NECK: 1. Normal MRA of the neck. No hemodynamically  significant stenosis or other acute vascular abnormality. 2. Aberrant right subclavian artery. Electronically Signed   By: Benjamin  McClintock M.D.   On: 04/12/2021 05:48   MR BRAIN WO CONTRAST  Result Date: 04/12/2021 CLINICAL DATA:  Initial evaluation for acute dysarthria. EXAM: MRI HEAD WITHOUT CONTRAST MRA HEAD WITHOUT CONTRAST MRA NECK WITHOUT AND WITH CONTRAST TECHNIQUE: Multiplanar, multi-echo pulse sequences of the brain and surrounding structures were acquired without intravenous contrast. Angiographic images of the Circle of Willis were acquired using MRA technique without intravenous contrast. Angiographic images of the neck were acquired using MRA technique without and with intravenous contrast. Carotid stenosis measurements (when applicable) are obtained utilizing NASCET criteria, using the distal internal carotid diameter   as the denominator. CONTRAST:  6mL GADAVIST GADOBUTROL 1 MMOL/ML IV SOLN COMPARISON:  Prior head CT from 04/11/2021. FINDINGS: MRI HEAD FINDINGS Brain: Cerebral volume within normal limits. Few scattered foci of patchy T2/FLAIR signal abnormality seen involving the periventricular and deep white matter both cerebral hemispheres, nonspecific, but suspected to reflect chronic microvascular ischemic disease, definitely advanced for age. Remote lacunar infarct present at the ventral medial left thalamus. A few tiny remote bilateral cerebellar infarcts noted. Patchy and confluent restricted diffusion involving primarily the cortex of the left temporal occipital and parietal region, consistent with acute ischemic infarct. A few suspected subtle foci of susceptibility artifact within the area of infarction, suggestive of minimal petechial hemorrhage. No frank hemorrhagic transformation or mass effect. There are additional scattered subcentimeter foci of restricted diffusion involving the right basal ganglia (series 5, images 79, 78). Patchy right PCA distribution infarcts involving the  right occipital lobe and right hippocampus (series 5, images 71, 70). Few punctate bilateral cerebellar infarcts (series 5, images 60, 66 additional punctate right frontal cortical infarct (series 5, image 88). No other associated hemorrhage or mass effect. No mass lesion or midline shift. No hydrocephalus or extra-axial fluid collection. Pituitary gland within normal limits for age. Midline structures intact. Vascular: Major intracranial vascular flow voids are maintained. Skull and upper cervical spine: Craniocervical junction within normal limits. Bone marrow signal intensity normal. No scalp soft tissue abnormality. Sinuses/Orbits: Globes and orbital soft tissues within normal limits. Paranasal sinuses are clear. No significant mastoid effusion. Inner ear structures grossly normal. Other: None. MRA HEAD FINDINGS ANTERIOR CIRCULATION: Visualized distal cervical segments of the internal carotid arteries are patent with antegrade flow. Petrous, cavernous, and supraclinoid segments patent without stenosis or other abnormality. A1 segments patent. Question 2 mm focal outpouching extending inferiorly from the anterior communicating artery complex (series 1037, image 8). Anterior cerebral arteries patent to their distal aspects without stenosis. No M1 stenosis or occlusion. Normal MCA bifurcations. MCA branches well perfused and symmetric. There is question of diffuse small vessel irregularity about the MCA branches bilaterally, best seen at the proximal left M2 anterior division (series 1037, image 8). POSTERIOR CIRCULATION: Both vertebral arteries patent to the vertebrobasilar junction without stenosis. Left vertebral artery slightly dominant. Both PICA origins patent and normal. Basilar patent to its distal aspect without stenosis. Superior cerebellar arteries patent bilaterally. Both PCAs supplied via the basilar as well as small bilateral posterior communicating arteries. PCAs well perfused to their distal aspects  without proximal stenosis. Again there is question of possible subtle distal small vessel irregularity. No intracranial aneurysm or other vascular abnormality. MRA NECK FINDINGS AORTIC ARCH: Visualized aortic arch normal in caliber. Aberrant right subclavian artery noted. Right CCA arises directly from the aortic arch. No significant vascular irregularity or stenosis about the origin of the great vessels. RIGHT CAROTID SYSTEM: Right common and internal carotid arteries widely patent without stenosis, evidence for dissection, or occlusion. LEFT CAROTID SYSTEM: Left common and internal carotid arteries widely patent without stenosis, evidence for dissection, or occlusion. VERTEBRAL ARTERIES: Both vertebral arteries arise from the subclavian arteries. Left vertebral artery dominant. Vertebral arteries patent without stenosis, evidence for dissection, or occlusion. IMPRESSION: MRI HEAD: 1. 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 Possible minimal petechial hemorrhage without hemorrhagic transformation or mass effect. 2. Additional multifocal acute ischemic infarcts involving the right frontal lobe, right basal ganglia, right temporoccipital region, and cerebellum as above. No other associated hemorrhage or mass effect. 3. Underlying remote lacunar infarcts   involving the left thalamus and cerebellum. 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 Changes are certainly advanced for patient age. Possible vasculitis would be the primary differential consideration given patient age and the presence of multiple acute and chronic infarcts. MRA HEAD: 1. Question subtle diffuse small vessel irregularity about the intracranial circulation. Differential considerations include early atherosclerotic disease versus changes of vasculopathy. 2. Otherwise  normal MRA appearance of the medium and large intracranial arterial vessels. No large vessel occlusion. No hemodynamically significant or correctable stenosis. 3. Question 2 mm focal outpouching arising from the anterior communicating artery complex, which could reflect focal vascular tortuosity versus a small aneurysm. Attention at follow-up recommended. MRA NECK: 1. Normal MRA of the neck. No hemodynamically significant stenosis or other acute vascular abnormality. 2. Aberrant right subclavian artery. Electronically Signed   By: Benjamin  McClintock M.D.   On: 04/12/2021 05:48   IR US Guide Vasc Access Right  Result Date: 04/13/2021 INDICATION: Anjoli Laske is a 19 year old female with a past medical history significant for asthma and anxiety. She presented to outside hospital on 04/11/2021 with speech difficulty. She was transferred and admitted to [facility] for stroke work-up. 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 A diagnostic cerebral angiogram was requested to evaluate cerebral vasculature. EXAM: ULTRASOUND-GUIDED VASCULAR ACCESS DIAGNOSTIC CEREBRAL ANGIOGRAM COMPARISON:  MR angiogram Apr 12, 2021 MEDICATIONS: None. ANESTHESIA/SEDATION: Versed 1 mg IV; Fentanyl 50 mcg IV Moderate Sedation Time:  1 hour and 16 minutes The patient was continuously monitored during the procedure by the interventional radiology nurse under my direct supervision. CONTRAST:  75 mL of Omnipaque 240 milligrams/mL FLUOROSCOPY TIME:  Fluoroscopy Time: 13 minutes 36 seconds (281.6 mGy). COMPLICATIONS: None immediate. TECHNIQUE: Informed written consent was obtained from the patient and her mother after a thorough discussion of the procedural risks, benefits and alternatives. All questions were addressed. Maximal Sterile Barrier Technique was utilized including caps, mask, sterile gowns, sterile gloves, sterile drape, hand hygiene and skin antiseptic.  A timeout was performed prior to the initiation of the procedure. Using the modified Seldinger technique and a micropuncture kit, access was gained to the distal right radial artery at the anatomical snuffbox and a 5 French sheath was placed. Real-time ultrasound guidance was utilized for vascular access including the acquisition of a permanent ultrasound image documenting patency of the accessed vessel. Slow intra arterial infusion of 5,000 IU heparin, 5 mg Verapamil and 200 mcg nitroglicerin diluted in patient's own blood was performed. No significant fluctuation in patient's blood pressure seen. Then, a right radial artery roadmap was obtained via sheath side port. Normal brachial artery branching pattern seen. No significant anatomical variation. The right radial artery caliber is adequate for vascular access. Next, a 5 French Simmons 2 glide catheter was navigated over a 0.035" Terumo Glidewire into the right subclavian artery under fluoroscopic guidance. Frontal right subclavian artery angiogram was obtained. Due to inability to access the aortic arch from the aberrant right subclavian artery, decision was made to proceed with femoral access. The right groin was prepped and draped in the usual sterile fashion. Using a micropuncture kit and the modified Seldinger technique, access was gained to the right common femoral artery and a 5 French sheath was placed. Under fluoroscopy, a 5 French Berenstein 2 catheter was navigated over a 0.035" Terumo Glidewire into the aortic arch. The catheter was placed   into the right common carotid artery. Frontal and lateral angiograms of the neck were obtained. Under biplane roadmap, the catheter was advanced into the right internal carotid artery. Frontal and lateral angiograms of the head were obtained followed by right anterior oblique and magnified lateral views of the head. The catheter was then retracted into the right common carotid artery and under fluoroscopy advanced  into the right external carotid artery. Frontal and lateral angiograms of the head were obtained. The catheter was then placed into the left subclavian artery and advanced into the left vertebral artery. Frontal, lateral waters and magnified lateral views of the head were obtained. The catheter was then advanced into the left common carotid artery. Frontal and lateral views of the neck were obtained. Under biplane roadmap, the catheter was advanced into the left internal carotid artery. Frontal, lateral, magnified left anterior oblique and magnified lateral views of the head were obtained. The catheter was then retracted and advanced into the left external carotid artery. Frontal and lateral angiograms of the head were obtained. The catheter was subsequently advanced into the right vertebral artery. Frontal and lateral views of the head were obtained. The catheter was subsequently withdrawn. A 5 French Perclose was utilized for right femoral access closure. Hemostasis was achieved after approximately 5 minutes of manual pressure hold. An inflatable band was placed and inflated over the right hand access site. The vascular sheath was withdrawn and the band was slowly deflated until brisk flow was noted through the arteriotomy site. At this point, the band was reinflated with additional 2 cc of air to obtain patent hemostasis. FINDINGS: Right radial artery ultrasound and right radial artery angiogram: The caliber of the distal right radial artery is appropriate for angiogram access. The right radial artery and the right ulnar artery have normal course and caliber. No significant anatomical variants noted. Right subclavian angiograms: Aberrant right subclavian artery is noted. No opacification of the right vertebral artery. Otherwise, the visualized right subclavian artery and visualized branches of the thyrocervical trunk are unremarkable. Right common femoral artery ultrasound: Normal course and caliber of the right  common femoral artery. Right CCA angiograms: Cervical angiograms show normal course and caliber of the visualized right common carotid and internal carotid arteries. There are no significant stenoses. Right ICA angiograms: There is brisk vascular contrast filling of the ACA and MCA vascular trees. Luminal caliber is smooth and tapering. No aneurysms or abnormally high-flow, early draining veins are seen. No regions of abnormal hypervascularity are noted. The visualized dural sinuses are patent. Right ECA and right occipital angiograms: No early venous drainage was noted. The intracranial branches of the right external carotid artery are unremarkable. Left vertebral artery angiograms: The left vertebral artery, basilar artery, and bilateral posterior cerebral arteries are unremarkable. Small infundibulum at the origin of a thalami perforator branch from the left P1/PCA is incidentally noted. Luminal caliber is smooth and tapering. No aneurysms or abnormally high-flow, early draining veins are seen. No regions of abnormal hypervascularity are noted. The visualized dural sinuses are patent. Left CCA angiograms: Cervical angiograms show normal course and caliber of the visualized left common carotid and internal carotid arteries. There are no significant stenoses. Left ICA angiograms: There is brisk vascular contrast filling of the the ACA and MCA vascular trees. Luminal caliber is smooth and tapering. No aneurysms or abnormally high-flow, early draining veins are seen. No regions of abnormal hypervascularity are noted. The visualized dural sinuses are patent. Left ECA angiograms: No early venous drainage was noted.  The intracranial branches of the left external carotid artery are unremarkable. Right vertebral artery angiograms: The right vertebral artery originates from the right common carotid artery. The right vertebral artery, basilar artery, and bilateral posterior cerebral arteries are unremarkable. Luminal  caliber is smooth and tapering. No aneurysms or abnormally high-flow, early draining veins are seen. No regions of abnormal hypervascularity are noted. The visualized dural sinuses are patent. PROCEDURE: Not applicable. IMPRESSION: No evidence of luminal irregularity, hemodynamically significant stenosis, occlusion or other significant vascular abnormality to explain patient's recent cerebral infarcts. No aneurysm, AVM or dural AV fistula. 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 PLAN: Results communicated to Dr. Kylee Nardozzi via secure text paging immediately after the angiogram was finalized. Electronically Signed   By: Katyucia  De Macedo Rodrigues M.D.   On: 04/13/2021 15:31   EEG adult  Result Date: 04/12/2021 Yadav, Priyanka O, MD     04/12/2021 12:34 PM Patient Name: Meghan Williamson MRN: [id number] Epilepsy Attending: Priyanka O Yadav Referring Physician/Provider: Dr Srishti Bhagat Date: 04/12/2021 Duration: 27.42 mins Patient history: 19 year old female with no significant medical history pertaining to strokes presented for evaluation of expressive aphasia and was found to have left temporal, occipital, parietal region acute ischemic infarcts.   EEG to evaluate for seizures. Level of alertness: Awake AEDs during EEG study: None Technical aspects: This EEG study was done with scalp electrodes positioned according to the 10-20 International system of electrode placement. Electrical activity was acquired at a sampling rate of 500Hz and reviewed with a high frequency filter of 70Hz and a low frequency filter of 1Hz. EEG data were recorded continuously and digitally stored. Description: The posterior dominant rhythm consists of 9-10 Hz activity of moderate voltage (25-35 uV) seen predominantly in posterior head regions, symmetric and reactive to eye opening and eye closing. EEG showed continuous 3 to 6 Hz theta-delta slowing in left  hemisphere, maximal left temporal region which at times appears rhythmic. Hyperventilation and photic stimulation were not performed.   ABNORMALITY -Continuous slow, left hemisphere, maximal left temporal region IMPRESSION: This study is suggestive of cortical dysfunction left hemisphere, maximal left temporal region likely secondary to underlying stroke.  No seizures or definite epileptiform discharges were seen throughout the recording. Priyanka O Yadav   ECHOCARDIOGRAM COMPLETE BUBBLE STUDY  Result Date: 04/13/2021    ECHOCARDIOGRAM REPORT   Patient Name:   Meghan Williamson Date of Exam: 04/13/2021 Medical Rec #:  [id number]  Height:       66.0 in Accession #:    [id number] Weight:       131.8 lb Date of Birth:  10/19/2001  BSA:          1.675 m Patient Age:    19 years   BP:           132/88 mmHg Patient Gender: F          HR:           49 bpm. Exam Location:  Inpatient Procedure: 2D Echo, Cardiac Doppler and Color Doppler Indications:    Stroke  History:        Patient has no prior history of Echocardiogram examinations.  Sonographer:    John Mendel Brown Referring Phys: [id number] STEVI W TOBERMAN IMPRESSIONS  1. Left ventricular ejection fraction, by estimation, is 60 to 65%. The left ventricle has normal function. The left ventricle has no regional wall motion abnormalities. There is mild left   ventricular hypertrophy. Left ventricular diastolic parameters were normal.  2. Right ventricular systolic function is normal. The right ventricular size is normal.  3. The mitral valve is normal in structure. Trivial mitral valve regurgitation.  4. The aortic valve was not well visualized. Aortic valve regurgitation is moderate. No aortic stenosis is present.  5. The inferior vena cava is normal in size with greater than 50% respiratory variability, suggesting right atrial pressure of 3 mmHg.  6. Agitated saline contrast bubble study was negative, with no evidence of any interatrial shunt. Conclusion(s)/Recommendation(s): AV  leaflets appear thickened with eccentric posterior directed moderate AI. Given presentation with acute CVA, recommend TEE for further evaluation. FINDINGS  Left Ventricle: Left ventricular ejection fraction, by estimation, is 60 to 65%. The left ventricle has normal function. The left ventricle has no regional wall motion abnormalities. The left ventricular internal cavity size was normal in size. There is  mild left ventricular hypertrophy. Left ventricular diastolic parameters were normal. Right Ventricle: The right ventricular size is normal. No increase in right ventricular wall thickness. Right ventricular systolic function is normal. Left Atrium: Left atrial size was normal in size. Right Atrium: Right atrial size was normal in size. Pericardium: There is no evidence of pericardial effusion. Mitral Valve: The mitral valve is normal in structure. Trivial mitral valve regurgitation. Tricuspid Valve: The tricuspid valve is normal in structure. Tricuspid valve regurgitation is not demonstrated. Aortic Valve: The aortic valve was not well visualized. Aortic valve regurgitation is moderate. No aortic stenosis is present. Aortic valve mean gradient measures 2.7 mmHg. Aortic valve peak gradient measures 4.8 mmHg. Aortic valve area, by VTI measures 3.05 cm. Pulmonic Valve: The pulmonic valve was not well visualized. Pulmonic valve regurgitation is trivial. Aorta: The aortic root and ascending aorta are structurally normal, with no evidence of dilitation. Venous: The inferior vena cava is normal in size with greater than 50% respiratory variability, suggesting right atrial pressure of 3 mmHg. IAS/Shunts: No atrial level shunt detected by color flow Doppler. Agitated saline contrast was given intravenously to evaluate for intracardiac shunting. Agitated saline contrast bubble study was negative, with no evidence of any interatrial shunt.  LEFT VENTRICLE PLAX 2D LVIDd:         4.60 cm  Diastology LVIDs:         2.70 cm   LV e' medial:    11.70 cm/s LV PW:         0.90 cm  LV E/e' medial:  7.9 LV IVS:        1.00 cm  LV e' lateral:   17.20 cm/s LVOT diam:     2.30 cm  LV E/e' lateral: 5.4 LV SV:         83 LV SV Index:   50 LVOT Area:     4.15 cm  RIGHT VENTRICLE            IVC RV Basal diam:  3.20 cm    IVC diam: 1.30 cm RV S prime:     9.65 cm/s TAPSE (M-mode): 2.2 cm LEFT ATRIUM             Index       RIGHT ATRIUM           Index LA diam:        3.10 cm 1.85 cm/m  RA Area:     11.05 cm LA Vol (A2C):   46.9 ml 28.00 ml/m RA Volume:   23.80 ml  14.21 ml/m LA Vol (  A4C):   56.2 ml 33.55 ml/m LA Biplane Vol: 53.0 ml 31.64 ml/m  AORTIC VALVE AV Area (Vmax):    3.57 cm AV Area (Vmean):   3.20 cm AV Area (VTI):     3.05 cm AV Vmax:           109.67 cm/s AV Vmean:          73.133 cm/s AV VTI:            0.272 m AV Peak Grad:      4.8 mmHg AV Mean Grad:      2.7 mmHg LVOT Vmax:         94.10 cm/s LVOT Vmean:        56.300 cm/s LVOT VTI:          0.200 m LVOT/AV VTI ratio: 0.74  AORTA Ao Root diam: 2.70 cm Ao Asc diam:  2.40 cm MITRAL VALVE MV Area (PHT): 4.06 cm    SHUNTS MV Decel Time: 187 msec    Systemic VTI:  0.20 m MV E velocity: 92.40 cm/s  Systemic Diam: 2.30 cm MV A velocity: 53.10 cm/s MV E/A ratio:  1.74 Christopher Schumann MD Electronically signed by Christopher Schumann MD Signature Date/Time: 04/13/2021/10:43:59 AM    Final    VAS US LOWER EXTREMITY VENOUS (DVT)  Result Date: 04/13/2021  Lower Venous DVT Study Patient Name:  Meghan Williamson  Date of Exam:   04/13/2021 Medical Rec #: [id number]   Accession #:    [id number] Date of Birth: 06/10/2001   Patient Gender: F Patient Age:   19Y Exam Location:  [facility] Procedure:      VAS US LOWER EXTREMITY VENOUS (DVT) Referring Phys: [id number] Larue Lightner --------------------------------------------------------------------------------  Indications: Stroke.  Limitations: Rt groin bandages. Comparison Study: no prior Performing Technologist: Megan Riddle RVS   Examination Guidelines: A complete evaluation includes B-mode imaging, spectral Doppler, color Doppler, and power Doppler as needed of all accessible portions of each vessel. Bilateral testing is considered an integral part of a complete examination. Limited examinations for reoccurring indications may be performed as noted. The reflux portion of the exam is performed with the patient in reverse Trendelenburg.  +---------+---------------+---------+-----------+----------+-------------------+ RIGHT    CompressibilityPhasicitySpontaneityPropertiesThrombus Aging      +---------+---------------+---------+-----------+----------+-------------------+ CFV                                                   Not well visualized +---------+---------------+---------+-----------+----------+-------------------+ SFJ                                                   Not well visualized +---------+---------------+---------+-----------+----------+-------------------+ FV Prox  Full                                                             +---------+---------------+---------+-----------+----------+-------------------+ FV Mid   Full                                                             +---------+---------------+---------+-----------+----------+-------------------+   FV DistalFull                                                             +---------+---------------+---------+-----------+----------+-------------------+ PFV      Full                                                             +---------+---------------+---------+-----------+----------+-------------------+ POP      Full           Yes      Yes                                      +---------+---------------+---------+-----------+----------+-------------------+ PTV      Full                                                              +---------+---------------+---------+-----------+----------+-------------------+ PERO     Full                                                             +---------+---------------+---------+-----------+----------+-------------------+   +---------+---------------+---------+-----------+----------+--------------+ LEFT     CompressibilityPhasicitySpontaneityPropertiesThrombus Aging +---------+---------------+---------+-----------+----------+--------------+ CFV      Full           Yes      Yes                                 +---------+---------------+---------+-----------+----------+--------------+ SFJ      Full                                                        +---------+---------------+---------+-----------+----------+--------------+ FV Prox  Full                                                        +---------+---------------+---------+-----------+----------+--------------+ FV Mid   Full                                                        +---------+---------------+---------+-----------+----------+--------------+ FV DistalFull                                                        +---------+---------------+---------+-----------+----------+--------------+   PFV      Full                                                        +---------+---------------+---------+-----------+----------+--------------+ POP      Full           Yes      Yes                                 +---------+---------------+---------+-----------+----------+--------------+ PTV      Full                                                        +---------+---------------+---------+-----------+----------+--------------+ PERO     Full                                                        +---------+---------------+---------+-----------+----------+--------------+     Summary: BILATERAL: - No evidence of deep vein thrombosis seen in the lower extremities, bilaterally.  -No evidence of popliteal cyst, bilaterally.   *See table(s) above for measurements and observations. Electronically signed by Deitra Mayo MD on 04/13/2021 at 4:14:59 PM.    Final    IR ANGIO INTRA EXTRACRAN SEL INTERNAL CAROTID BILAT MOD SED  Result Date: 04/13/2021 INDICATION: Meghan Williamson is a 20 year old female with a past medical history significant for asthma and anxiety. She presented to outside hospital on 04/11/2021 with speech difficulty. She was transferred and admitted to [facility] for stroke work-up. 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 A diagnostic cerebral angiogram was requested to evaluate cerebral vasculature. EXAM: ULTRASOUND-GUIDED VASCULAR ACCESS DIAGNOSTIC CEREBRAL ANGIOGRAM COMPARISON:  MR angiogram Apr 12, 2021 MEDICATIONS: None. ANESTHESIA/SEDATION: Versed 1 mg IV; Fentanyl 50 mcg IV Moderate Sedation Time:  1 hour and 16 minutes The patient was continuously monitored during the procedure by the interventional radiology nurse under my direct supervision. CONTRAST:  75 mL of Omnipaque 240 milligrams/mL FLUOROSCOPY TIME:  Fluoroscopy Time: 13 minutes 36 seconds (281.6 mGy). COMPLICATIONS: None immediate. TECHNIQUE: Informed written consent was obtained from the patient and her mother after a thorough discussion of the procedural risks, benefits and alternatives. All questions were addressed. Maximal Sterile Barrier Technique was utilized including caps, mask, sterile gowns, sterile gloves, sterile drape, hand hygiene and skin antiseptic. A timeout was performed prior to the initiation of the procedure. Using the modified Seldinger technique and a micropuncture kit, access was gained to the distal right radial artery at the anatomical snuffbox and a 5 French sheath was placed. Real-time ultrasound guidance was utilized for vascular access including the acquisition of a permanent ultrasound image  documenting patency of the accessed vessel. Slow intra arterial infusion of 5,000 IU heparin, 5 mg Verapamil and 970 mcg nitroglicerin diluted in patient's own blood was performed. No significant fluctuation in patient's blood pressure seen. Then, a right radial artery roadmap was obtained  via sheath side port. Normal brachial artery branching pattern seen. No significant anatomical variation. The right radial artery caliber is adequate for vascular access. Next, a 5 French Simmons 2 glide catheter was navigated over a 0.035" Terumo Glidewire into the right subclavian artery under fluoroscopic guidance. Frontal right subclavian artery angiogram was obtained. Due to inability to access the aortic arch from the aberrant right subclavian artery, decision was made to proceed with femoral access. The right groin was prepped and draped in the usual sterile fashion. Using a micropuncture kit and the modified Seldinger technique, access was gained to the right common femoral artery and a 5 French sheath was placed. Under fluoroscopy, a 5 French Berenstein 2 catheter was navigated over a 0.035" Terumo Glidewire into the aortic arch. The catheter was placed into the right common carotid artery. Frontal and lateral angiograms of the neck were obtained. Under biplane roadmap, the catheter was advanced into the right internal carotid artery. Frontal and lateral angiograms of the head were obtained followed by right anterior oblique and magnified lateral views of the head. The catheter was then retracted into the right common carotid artery and under fluoroscopy advanced into the right external carotid artery. Frontal and lateral angiograms of the head were obtained. The catheter was then placed into the left subclavian artery and advanced into the left vertebral artery. Frontal, lateral waters and magnified lateral views of the head were obtained. The catheter was then advanced into the left common carotid artery. Frontal and  lateral views of the neck were obtained. Under biplane roadmap, the catheter was advanced into the left internal carotid artery. Frontal, lateral, magnified left anterior oblique and magnified lateral views of the head were obtained. The catheter was then retracted and advanced into the left external carotid artery. Frontal and lateral angiograms of the head were obtained. The catheter was subsequently advanced into the right vertebral artery. Frontal and lateral views of the head were obtained. The catheter was subsequently withdrawn. A 5 French Perclose was utilized for right femoral access closure. Hemostasis was achieved after approximately 5 minutes of manual pressure hold. An inflatable band was placed and inflated over the right hand access site. The vascular sheath was withdrawn and the band was slowly deflated until brisk flow was noted through the arteriotomy site. At this point, the band was reinflated with additional 2 cc of air to obtain patent hemostasis. FINDINGS: Right radial artery ultrasound and right radial artery angiogram: The caliber of the distal right radial artery is appropriate for angiogram access. The right radial artery and the right ulnar artery have normal course and caliber. No significant anatomical variants noted. Right subclavian angiograms: Aberrant right subclavian artery is noted. No opacification of the right vertebral artery. Otherwise, the visualized right subclavian artery and visualized branches of the thyrocervical trunk are unremarkable. Right common femoral artery ultrasound: Normal course and caliber of the right common femoral artery. Right CCA angiograms: Cervical angiograms show normal course and caliber of the visualized right common carotid and internal carotid arteries. There are no significant stenoses. Right ICA angiograms: There is brisk vascular contrast filling of the ACA and MCA vascular trees. Luminal caliber is smooth and tapering. No aneurysms or  abnormally high-flow, early draining veins are seen. No regions of abnormal hypervascularity are noted. The visualized dural sinuses are patent. Right ECA and right occipital angiograms: No early venous drainage was noted. The intracranial branches of the right external carotid artery are unremarkable. Left vertebral artery angiograms: The left vertebral   artery, basilar artery, and bilateral posterior cerebral arteries are unremarkable. Small infundibulum at the origin of a thalami perforator branch from the left P1/PCA is incidentally noted. Luminal caliber is smooth and tapering. No aneurysms or abnormally high-flow, early draining veins are seen. No regions of abnormal hypervascularity are noted. The visualized dural sinuses are patent. Left CCA angiograms: Cervical angiograms show normal course and caliber of the visualized left common carotid and internal carotid arteries. There are no significant stenoses. Left ICA angiograms: There is brisk vascular contrast filling of the the ACA and MCA vascular trees. Luminal caliber is smooth and tapering. No aneurysms or abnormally high-flow, early draining veins are seen. No regions of abnormal hypervascularity are noted. The visualized dural sinuses are patent. Left ECA angiograms: No early venous drainage was noted. The intracranial branches of the left external carotid artery are unremarkable. Right vertebral artery angiograms: The right vertebral artery originates from the right common carotid artery. The right vertebral artery, basilar artery, and bilateral posterior cerebral arteries are unremarkable. Luminal caliber is smooth and tapering. No aneurysms or abnormally high-flow, early draining veins are seen. No regions of abnormal hypervascularity are noted. The visualized dural sinuses are patent. PROCEDURE: Not applicable. IMPRESSION: No evidence of luminal irregularity, hemodynamically significant stenosis, occlusion or other significant vascular abnormality to  explain patient's recent cerebral infarcts. No aneurysm, AVM or dural AV fistula. 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 PLAN: Results communicated to Dr. Erlinda Hong via secure text paging immediately after the angiogram was finalized. Electronically Signed   By: Pedro Earls M.D.   On: 04/13/2021 15:31   IR ANGIO VERTEBRAL SEL SUBCLAVIAN INNOMINATE BILAT MOD SED  Result Date: 04/13/2021 INDICATION: Meghan Williamson is a 20 year old female with a past medical history significant for asthma and anxiety. She presented to outside hospital on 04/11/2021 with speech difficulty. She was transferred and admitted to [facility] for stroke work-up. 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 A diagnostic cerebral angiogram was requested to evaluate cerebral vasculature. EXAM: ULTRASOUND-GUIDED VASCULAR ACCESS DIAGNOSTIC CEREBRAL ANGIOGRAM COMPARISON:  MR angiogram Apr 12, 2021 MEDICATIONS: None. ANESTHESIA/SEDATION: Versed 1 mg IV; Fentanyl 50 mcg IV Moderate Sedation Time:  1 hour and 16 minutes The patient was continuously monitored during the procedure by the interventional radiology nurse under my direct supervision. CONTRAST:  75 mL of Omnipaque 240 milligrams/mL FLUOROSCOPY TIME:  Fluoroscopy Time: 13 minutes 36 seconds (281.6 mGy). COMPLICATIONS: None immediate. TECHNIQUE: Informed written consent was obtained from the patient and her mother after a thorough discussion of the procedural risks, benefits and alternatives. All questions were addressed. Maximal Sterile Barrier Technique was utilized including caps, mask, sterile gowns, sterile gloves, sterile drape, hand hygiene and skin antiseptic. A timeout was performed prior to the initiation of the procedure. Using the modified Seldinger technique and a micropuncture kit, access was gained  to the distal right radial artery at the anatomical snuffbox and a 5 French sheath was placed. Real-time ultrasound guidance was utilized for vascular access including the acquisition of a permanent ultrasound image documenting patency of the accessed vessel. Slow intra arterial infusion of 5,000 IU heparin, 5 mg Verapamil and 751 mcg nitroglicerin diluted in patient's own blood was performed. No significant fluctuation in patient's blood pressure seen. Then, a right radial artery roadmap was obtained via sheath side port. Normal brachial artery branching pattern seen.  No significant anatomical variation. The right radial artery caliber is adequate for vascular access. Next, a 5 French Simmons 2 glide catheter was navigated over a 0.035" Terumo Glidewire into the right subclavian artery under fluoroscopic guidance. Frontal right subclavian artery angiogram was obtained. Due to inability to access the aortic arch from the aberrant right subclavian artery, decision was made to proceed with femoral access. The right groin was prepped and draped in the usual sterile fashion. Using a micropuncture kit and the modified Seldinger technique, access was gained to the right common femoral artery and a 5 French sheath was placed. Under fluoroscopy, a 5 French Berenstein 2 catheter was navigated over a 0.035" Terumo Glidewire into the aortic arch. The catheter was placed into the right common carotid artery. Frontal and lateral angiograms of the neck were obtained. Under biplane roadmap, the catheter was advanced into the right internal carotid artery. Frontal and lateral angiograms of the head were obtained followed by right anterior oblique and magnified lateral views of the head. The catheter was then retracted into the right common carotid artery and under fluoroscopy advanced into the right external carotid artery. Frontal and lateral angiograms of the head were obtained. The catheter was then placed into the left  subclavian artery and advanced into the left vertebral artery. Frontal, lateral waters and magnified lateral views of the head were obtained. The catheter was then advanced into the left common carotid artery. Frontal and lateral views of the neck were obtained. Under biplane roadmap, the catheter was advanced into the left internal carotid artery. Frontal, lateral, magnified left anterior oblique and magnified lateral views of the head were obtained. The catheter was then retracted and advanced into the left external carotid artery. Frontal and lateral angiograms of the head were obtained. The catheter was subsequently advanced into the right vertebral artery. Frontal and lateral views of the head were obtained. The catheter was subsequently withdrawn. A 5 French Perclose was utilized for right femoral access closure. Hemostasis was achieved after approximately 5 minutes of manual pressure hold. An inflatable band was placed and inflated over the right hand access site. The vascular sheath was withdrawn and the band was slowly deflated until brisk flow was noted through the arteriotomy site. At this point, the band was reinflated with additional 2 cc of air to obtain patent hemostasis. FINDINGS: Right radial artery ultrasound and right radial artery angiogram: The caliber of the distal right radial artery is appropriate for angiogram access. The right radial artery and the right ulnar artery have normal course and caliber. No significant anatomical variants noted. Right subclavian angiograms: Aberrant right subclavian artery is noted. No opacification of the right vertebral artery. Otherwise, the visualized right subclavian artery and visualized branches of the thyrocervical trunk are unremarkable. Right common femoral artery ultrasound: Normal course and caliber of the right common femoral artery. Right CCA angiograms: Cervical angiograms show normal course and caliber of the visualized right common carotid and  internal carotid arteries. There are no significant stenoses. Right ICA angiograms: There is brisk vascular contrast filling of the ACA and MCA vascular trees. Luminal caliber is smooth and tapering. No aneurysms or abnormally high-flow, early draining veins are seen. No regions of abnormal hypervascularity are noted. The visualized dural sinuses are patent. Right ECA and right occipital angiograms: No early venous drainage was noted. The intracranial branches of the right external carotid artery are unremarkable. Left vertebral artery angiograms: The left vertebral artery, basilar artery, and bilateral posterior cerebral arteries are unremarkable.   Small infundibulum at the origin of a thalami perforator branch from the left P1/PCA is incidentally noted. Luminal caliber is smooth and tapering. No aneurysms or abnormally high-flow, early draining veins are seen. No regions of abnormal hypervascularity are noted. The visualized dural sinuses are patent. Left CCA angiograms: Cervical angiograms show normal course and caliber of the visualized left common carotid and internal carotid arteries. There are no significant stenoses. Left ICA angiograms: There is brisk vascular contrast filling of the the ACA and MCA vascular trees. Luminal caliber is smooth and tapering. No aneurysms or abnormally high-flow, early draining veins are seen. No regions of abnormal hypervascularity are noted. The visualized dural sinuses are patent. Left ECA angiograms: No early venous drainage was noted. The intracranial branches of the left external carotid artery are unremarkable. Right vertebral artery angiograms: The right vertebral artery originates from the right common carotid artery. The right vertebral artery, basilar artery, and bilateral posterior cerebral arteries are unremarkable. Luminal caliber is smooth and tapering. No aneurysms or abnormally high-flow, early draining veins are seen. No regions of abnormal hypervascularity are  noted. The visualized dural sinuses are patent. PROCEDURE: Not applicable. IMPRESSION: No evidence of luminal irregularity, hemodynamically significant stenosis, occlusion or other significant vascular abnormality to explain patient's recent cerebral infarcts. No aneurysm, AVM or dural AV fistula. 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 PLAN: Results communicated to Dr. Erlinda Hong via secure text paging immediately after the angiogram was finalized. Electronically Signed   By: Pedro Earls M.D.   On: 04/13/2021 15:31   IR ANGIO EXTERNAL CAROTID SEL EXT CAROTID BILAT MOD SED  Result Date: 04/13/2021 INDICATION: Kateland Leisinger is a 20 year old female with a past medical history significant for asthma and anxiety. She presented to outside hospital on 04/11/2021 with speech difficulty. She was transferred and admitted to [facility] for stroke work-up. 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 A diagnostic cerebral angiogram was requested to evaluate cerebral vasculature. EXAM: ULTRASOUND-GUIDED VASCULAR ACCESS DIAGNOSTIC CEREBRAL ANGIOGRAM COMPARISON:  MR angiogram Apr 12, 2021 MEDICATIONS: None. ANESTHESIA/SEDATION: Versed 1 mg IV; Fentanyl 50 mcg IV Moderate Sedation Time:  1 hour and 16 minutes The patient was continuously monitored during the procedure by the interventional radiology nurse under my direct supervision. CONTRAST:  75 mL of Omnipaque 240 milligrams/mL FLUOROSCOPY TIME:  Fluoroscopy Time: 13 minutes 36 seconds (281.6 mGy). COMPLICATIONS: None immediate. TECHNIQUE: Informed written consent was obtained from the patient and her mother after a thorough discussion of the procedural risks, benefits and alternatives. All questions were addressed. Maximal Sterile Barrier Technique was utilized including caps, mask, sterile  gowns, sterile gloves, sterile drape, hand hygiene and skin antiseptic. A timeout was performed prior to the initiation of the procedure. Using the modified Seldinger technique and a micropuncture kit, access was gained to the distal right radial artery at the anatomical snuffbox and a 5 French sheath was placed. Real-time ultrasound guidance was utilized for vascular access including the acquisition of a permanent ultrasound image documenting patency of the accessed vessel. Slow intra arterial infusion of 5,000 IU heparin, 5 mg Verapamil and 466 mcg nitroglicerin diluted in patient's own blood was performed. No significant fluctuation in patient's blood pressure seen. Then, a right radial artery roadmap was obtained via sheath side port. Normal brachial artery branching pattern seen. No significant anatomical variation. The right radial artery caliber  is adequate for vascular access. Next, a 5 French Simmons 2 glide catheter was navigated over a 0.035" Terumo Glidewire into the right subclavian artery under fluoroscopic guidance. Frontal right subclavian artery angiogram was obtained. Due to inability to access the aortic arch from the aberrant right subclavian artery, decision was made to proceed with femoral access. The right groin was prepped and draped in the usual sterile fashion. Using a micropuncture kit and the modified Seldinger technique, access was gained to the right common femoral artery and a 5 French sheath was placed. Under fluoroscopy, a 5 French Berenstein 2 catheter was navigated over a 0.035" Terumo Glidewire into the aortic arch. The catheter was placed into the right common carotid artery. Frontal and lateral angiograms of the neck were obtained. Under biplane roadmap, the catheter was advanced into the right internal carotid artery. Frontal and lateral angiograms of the head were obtained followed by right anterior oblique and magnified lateral views of the head. The catheter was then  retracted into the right common carotid artery and under fluoroscopy advanced into the right external carotid artery. Frontal and lateral angiograms of the head were obtained. The catheter was then placed into the left subclavian artery and advanced into the left vertebral artery. Frontal, lateral waters and magnified lateral views of the head were obtained. The catheter was then advanced into the left common carotid artery. Frontal and lateral views of the neck were obtained. Under biplane roadmap, the catheter was advanced into the left internal carotid artery. Frontal, lateral, magnified left anterior oblique and magnified lateral views of the head were obtained. The catheter was then retracted and advanced into the left external carotid artery. Frontal and lateral angiograms of the head were obtained. The catheter was subsequently advanced into the right vertebral artery. Frontal and lateral views of the head were obtained. The catheter was subsequently withdrawn. A 5 French Perclose was utilized for right femoral access closure. Hemostasis was achieved after approximately 5 minutes of manual pressure hold. An inflatable band was placed and inflated over the right hand access site. The vascular sheath was withdrawn and the band was slowly deflated until brisk flow was noted through the arteriotomy site. At this point, the band was reinflated with additional 2 cc of air to obtain patent hemostasis. FINDINGS: Right radial artery ultrasound and right radial artery angiogram: The caliber of the distal right radial artery is appropriate for angiogram access. The right radial artery and the right ulnar artery have normal course and caliber. No significant anatomical variants noted. Right subclavian angiograms: Aberrant right subclavian artery is noted. No opacification of the right vertebral artery. Otherwise, the visualized right subclavian artery and visualized branches of the thyrocervical trunk are unremarkable.  Right common femoral artery ultrasound: Normal course and caliber of the right common femoral artery. Right CCA angiograms: Cervical angiograms show normal course and caliber of the visualized right common carotid and internal carotid arteries. There are no significant stenoses. Right ICA angiograms: There is brisk vascular contrast filling of the ACA and MCA vascular trees. Luminal caliber is smooth and tapering. No aneurysms or abnormally high-flow, early draining veins are seen. No regions of abnormal hypervascularity are noted. The visualized dural sinuses are patent. Right ECA and right occipital angiograms: No early venous drainage was noted. The intracranial branches of the right external carotid artery are unremarkable. Left vertebral artery angiograms: The left vertebral artery, basilar artery, and bilateral posterior cerebral arteries are unremarkable. Small infundibulum at the origin of a thalami perforator   branch from the left P1/PCA is incidentally noted. Luminal caliber is smooth and tapering. No aneurysms or abnormally high-flow, early draining veins are seen. No regions of abnormal hypervascularity are noted. The visualized dural sinuses are patent. Left CCA angiograms: Cervical angiograms show normal course and caliber of the visualized left common carotid and internal carotid arteries. There are no significant stenoses. Left ICA angiograms: There is brisk vascular contrast filling of the the ACA and MCA vascular trees. Luminal caliber is smooth and tapering. No aneurysms or abnormally high-flow, early draining veins are seen. No regions of abnormal hypervascularity are noted. The visualized dural sinuses are patent. Left ECA angiograms: No early venous drainage was noted. The intracranial branches of the left external carotid artery are unremarkable. Right vertebral artery angiograms: The right vertebral artery originates from the right common carotid artery. The right vertebral artery, basilar  artery, and bilateral posterior cerebral arteries are unremarkable. Luminal caliber is smooth and tapering. No aneurysms or abnormally high-flow, early draining veins are seen. No regions of abnormal hypervascularity are noted. The visualized dural sinuses are patent. PROCEDURE: Not applicable. IMPRESSION: No evidence of luminal irregularity, hemodynamically significant stenosis, occlusion or other significant vascular abnormality to explain patient's recent cerebral infarcts. No aneurysm, AVM or dural AV fistula. 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 PLAN: Results communicated to Dr. Eila Runyan via secure text paging immediately after the angiogram was finalized. Electronically Signed   By: Katyucia  De Macedo Rodrigues M.D.   On: 04/13/2021 15:31    PHYSICAL EXAM  Temp:  [98 F (36.7 C)-98.6 F (37 C)] 98 F (36.7 C) (05/31 0356) Pulse Rate:  [49-98] 62 (05/31 1537) Resp:  [10-26] 18 (05/31 1205) BP: (114-155)/(75-102) 135/86 (05/31 1537) SpO2:  [98 %-100 %] 100 % (05/31 1537)  General - Well nourished, well developed, in no apparent distress.  Ophthalmologic - fundi not visualized due to noncooperation.  Cardiovascular - Regular rhythm and rate.  Mental Status -  Level of arousal and orientation to time, place, and person were intact. Language including naming, comprehension was assessed and found intact.  However, patient has difficulty repeating complex sentences, intermittent word finding difficulty. Fund of Knowledge was assessed and was intact.  Cranial Nerves II - XII - II - Visual field intact OU. III, IV, VI - Extraocular movements intact. V - Facial sensation intact bilaterally. VII - Facial movement intact bilaterally. VIII - Hearing & vestibular intact bilaterally. X - Palate elevates symmetrically. XI - Chin turning & shoulder shrug intact bilaterally. XII - Tongue protrusion  intact.  Motor Strength - The patient's strength was normal in all extremities and pronator drift was absent.  Bulk was normal and fasciculations were absent.   Motor Tone - Muscle tone was assessed at the neck and appendages and was normal.  Reflexes - The patient's reflexes were symmetrical in all extremities and she had no pathological reflexes.  Sensory - Light touch, temperature/pinprick were assessed and were symmetrical.    Coordination - The patient had normal movements in the hands and feet with no ataxia or dysmetria.  Tremor was absent.  Gait and Station - deferred.   ASSESSMENT/PLAN Meghan Williamson is a 19 y.o. female with history of OCP use, intermittent smoker and THC user admitted for expressive aphasia and headache. No tPA given due to outside window.    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 RCVS   Resultant mild expressive   aphasia  CT no acute abnormality  MRI left MCA moderate sized infarct, right PCA, right cerebellum and right frontal punctate infarcts  MRA subtle diffuse small vessel irregularity in the intracranial circulation  Cerebral angiogram unremarkable, not consistent with vasculitis  CSF unremarkable, not consistent with vasculitis  2D Echo EF 60 to 65%, no PFO seen  LE venous Doppler negative for DVT  TEE pending  LDL 89  HgbA1c pending  Hypercoagulable and autoimmune work-up pending  SCDs for VTE prophylaxis  No antithrombotic prior to admission, now on aspirin 81 mg daily and aspirin 325 mg daily for 3 weeks and then aspirin alone  Patient counseled to be compliant with her antithrombotic medications  Ongoing aggressive stroke risk factor management  Therapy recommendations: None  Disposition: Pending  Hyperlipidemia  Home meds: None  LDL 89, goal < 70  Now on Lipitor 20, no high intensity statin due to childbearing age and relatively LDL close to goal  Continue statin at discharge  OCP  use  Patient on low-dose estrogen for about a year  Recommend patient discuss with OB physician, off OCP if able, if not, consider progesterone-only OCPs  Tobacco abuse  Current intermittent smoker  Smoking cessation counseling provided  Pt is willing to quit  THC use  UDS positive for THC  Cessation counseling provided  Patient invading to create  Other Stroke Risk Factors    Other Active Problems    Hospital day # 1  I spent  35 minutes in total face-to-face time with the patient, more than 50% of which was spent in counseling and coordination of care, reviewing test results, images and medication, and discussing the diagnosis, treatment plan and potential prognosis. This patient's care requiresreview of multiple databases, neurological assessment, discussion with family, other specialists and medical decision making of high complexity. I had long discussion with patient parents at bedside, updated pt current condition, treatment plan and potential prognosis, and answered all the questions.  They expressed understanding and appreciation.   Viva Gallaher, MD PhD Stroke Neurology 04/13/2021 4:41 PM    To contact Stroke Continuity provider, please refer to [email or web address]. After hours, contact General Neurology  

## 2021-04-13 NOTE — Procedures (Signed)
INTERVENTIONAL NEURORADIOLOGY BRIEF POSTPROCEDURE NOTE  Diagnostic cerebral angiogram   Attending: Dr. Baldemar Lenis  Assistant: None.   Diagnosis: Multiple bilateral infarcts.   Access site: Distal right radial artery/right common femoral artery.   Access closure: Inflatable band/5 [place] ExoSeal.   Anesthesia: Moderate sedation   Medication used: 1 mg Versed IV; 50 mcg Fentanyl IV.  Complications: None.   Estimated blood loss: Negligible.   Specimen: None.   Findings: No evidence of intracranial occlusion, hemodynamically significant stenosis, significant luminal irregularity, aneurysm, AVM or other significant vascular abnormality to explain patient's symptoms.  Incidentally noted aberrant right subclavian artery.   The patient tolerated the procedure well without incident or complication and is in stable condition.

## 2021-04-13 NOTE — Sedation Documentation (Signed)
Right groin femoral sheath removed, 11fr exoseal closure device deployed by MD.

## 2021-04-13 NOTE — Sedation Documentation (Signed)
Right radial sheath removed, TR band applied with 8cc of air in TR band.

## 2021-04-13 NOTE — Plan of Care (Signed)
  Problem: Education: Goal: Knowledge of General Education information will improve Description: Including pain rating scale, medication(s)/side effects and non-pharmacologic comfort measures Outcome: Progressing   Problem: Health Behavior/Discharge Planning: Goal: Ability to manage health-related needs will improve Outcome: Progressing   Problem: Clinical Measurements: Goal: Ability to maintain clinical measurements within normal limits will improve Outcome: Progressing Goal: Will remain free from infection Outcome: Progressing Goal: Diagnostic test results will improve Outcome: Progressing Goal: Respiratory complications will improve Outcome: Progressing Goal: Cardiovascular complication will be avoided Outcome: Progressing   Problem: Activity: Goal: Risk for activity intolerance will decrease Outcome: Progressing   Problem: Nutrition: Goal: Adequate nutrition will be maintained Outcome: Progressing   Problem: Coping: Goal: Level of anxiety will decrease Outcome: Progressing   Problem: Elimination: Goal: Will not experience complications related to bowel motility Outcome: Progressing Goal: Will not experience complications related to urinary retention Outcome: Progressing   Problem: Pain Managment: Goal: General experience of comfort will improve Outcome: Progressing   Problem: Safety: Goal: Ability to remain free from injury will improve Outcome: Progressing   Problem: Skin Integrity: Goal: Risk for impaired skin integrity will decrease Outcome: Progressing   Problem: Education: Goal: Knowledge of disease or condition will improve Outcome: Progressing   Problem: Self-Care: Goal: Ability to participate in self-care as condition permits will improve Outcome: Progressing

## 2021-04-13 NOTE — Progress Notes (Addendum)
PROGRESS NOTE    Meghan Williamson  ZOX:[id number] DOB: 09/16/2001 DOA: 04/11/2021 PCP: Pcp, No   Brief Narrative:  HPI on 04/11/2021 by Dr. Elige Radon Chotiner Meghan Williamson is a 20 y.o. female with medical history of intermittent seasonal allergies inducing asthma with no respiratory problems or symptoms. Presented to [facility] ER for evaluation of difficulty speaking and memory problems that began last pm. She states she was driving home from work around 1 AM when she developed a left-sided headache that she reports was throbbing but did not radiate.  She had no visual changes or auras with the headache.  She has no history of migraine headaches.  After she arrived home she had difficulty speaking.  She states that when she tried to talk to her sister she had difficulty finding the words to say and when she did speak the words were jumbled and did not make sense.  She tried texting her mother who was on vacation at the beach at the time and she could only type out jumbled letters that did not make coherent sense.  And has some memory lapses and difficulty remembering things.  Parents came home from the beach this morning.  She was taken to the emergency room for evaluation.  There is no report of any drooping face or difficulty walking or imbalance.  There is no report of any seizure-like activity or tremors and jerking of extremities.  He had no fever, nausea vomiting or diarrhea.  There is no history of seizures or migraine headaches in the family.  There is no known family history of clotting disorders. She is on oral contraceptives to regulate her menstrual period.  She has been under a lot of stress recently.  She just finished her first year of college at [employer] and returned home for the summer recently.  She is working at [employer], [employer], as a Psychologist, educational.  She has never had symptoms like this in the past. Does state that she occasionally uses vaping products or smoke a cigarette.  She does  report occasional marijuana use with her last use last night.  She denies any heroin, cocaine, LSD, PCP, ecstasy.  She denies alcohol use.  Interim history Patient presented with memory and speech deficits.  MRI did show CVA.  Pending complete work-up and neurology consultation. Assessment & Plan   Acute CVA -Patient presented with speech difficulties and memory deficits -CT head was unremarkable -MRI brain showed acute ischemic cortical infarct involving left temporal occipital and parietal region possible minimal petechial hemorrhage without hemorrhagic transformation or mass-effect.  Multifocal acute ischemic infarcts involving the right frontal lobe, right basal ganglia, right temporal occipital region and cerebellum.  Underlying remote lacunar infarcts involving left thalamus and cerebellum.  -MRA head: Question subtle diffuse small vessel irregularity about the intracranial circulation, considerations for early atherosclerotic disease versus changes in vasculopathy.  Otherwise normal MRA, no large vessel occlusion.  Question 2 mm focal outpouching arising from the anterior communicating artery complex could reflect focal vascular tortuosity versus a small aneurysm. -MRA neck unremarkable -LDL 89, hemoglobin A1c pending -Echocardiogram EF 60 to 65%, no regional wall motion abnormalities.  Mild LVH.  Trivial MV regurgitation, aortic valve regurgitation.  Bubble study unremarkable, no evidence of an intra-arterial shunt -Pending TEE- Cardiology made aware- likely to occur 04/14/21 -EEG suggestive of cortical dysfunction left hemisphere, maximal left temporal region likely due to underlying stroke.  No seizures or left form discharges seen. -Status post diagnostic cerebral angiogram which showed  no evidence of intracranial occlusion, hemodynamically significant stenosis, aneurysm, AVM.  Incidentally noted aberrant right subclavian artery. -Hypercoagulable work-up pending -Status post LP -PT and OT  evaluated patient, no further therapy needed -Speech therapy recommended outpatient follow-up -Neurology consulted and appreciated -Pending lower extremity Doppler -Of note patient is on oral contraceptives and started approximately 1 year ago.  She also was diagnosed with COVID in November 2020. -UDS positive for THC  Vitamin B12 deficiency -Level was 88, will start patient on supplementation-recommend daily x7 days followed by weekly x1 month followed by monthly supplementation until B12 normalizes  Menstrual irregularities  -Patient was placed on OCPs about 1 year ago. -Given CVA, would recommend avoiding estrogen containing OCPs, possibly progestin only -Discussed this with patient and mother   DVT Prophylaxis  SCDs  Code Status: Full  Family Communication: Mother at bedside  Disposition Plan:  Status is: Inpatient  Remains inpatient appropriate because:Ongoing diagnostic testing needed not appropriate for outpatient work up and Inpatient level of care appropriate due to severity of illness   Dispo: The patient is from: Home              Anticipated d/c is to: Home              Patient currently is not medically stable to d/c.   Difficult to place patient No    Consultants Neurology  Procedures  EEG  Echocardiogram Cerebral angiogram Lumbar puncture  Antibiotics   Anti-infectives (From admission, onward)   None      Subjective:   Meghan Williamson seen and examined today.  Patient with no complaints this morning.  Continues to have some language comprehension issues.  Denies current dizziness or headache, shortness of breath, abdominal pain, nausea or vomiting, diarrhea constipation, dizziness or headache.    Objective:   Vitals:   04/13/21 1110 04/13/21 1115 04/13/21 1120 04/13/21 1125  BP: 140/87 (!) 138/98 (!) 147/102 (!) 150/96  Pulse: 74 82 70 84  Resp: Temp:      TempSrc:      SpO2: 99% 100% 99% 98%  Weight:      Height:         Intake/Output Summary (Last 24 hours) at 04/13/2021 1201 Last data filed at 04/13/2021 0400 Gross per 24 hour  Intake 2398.96 ml  Output --  Net 2398.96 ml   Filed Weights   04/11/21 1352 04/11/21 2106  Weight: 58.1 kg 59.8 kg   Exam  General: Well developed, well nourished, NAD, appears stated age  HEENT: NCAT, , mucous membranes moist.   Cardiovascular: S1 S2 auscultated, RRR  Respiratory: Clear to auscultation bilaterally with equal chest rise  Abdomen: Soft, nontender, nondistended, + bowel sounds  Extremities: warm dry without cyanosis clubbing or edema  Neuro: AAOx3, mild dysarthria and comprehension deficits, strength 5/5 in upper and lower extremities  Psych: appropriate mood and affect   Data Reviewed: I have personally reviewed following labs and imaging studies  CBC: Recent Labs  Lab 04/11/21 1444  WBC 4.8  NEUTROABS 2.4  HGB 13.0  HCT 38.7  MCV 81.8  PLT 266   Basic Metabolic Panel: Recent Labs  Lab 04/11/21 1444  NA 137  K 4.0  CL 105  CO2 24  GLUCOSE 87  BUN 10  CREATININE 0.79  CALCIUM 9.7   GFR: Estimated Creatinine Clearance: 106.8 mL/min (by C-G formula based on SCr of 0.79 mg/dL). Liver Function Tests: Recent Labs  Lab 04/11/21 1444  AST  12*  ALT 9  ALKPHOS 45  BILITOT 0.4  PROT 7.2  ALBUMIN 4.4   No results for input(s): LIPASE, AMYLASE in the last 168 hours. No results for input(s): AMMONIA in the last 168 hours. Coagulation Profile: Recent Labs  Lab 04/11/21 1444  INR 1.1   Cardiac Enzymes: No results for input(s): CKTOTAL, CKMB, CKMBINDEX, TROPONINI in the last 168 hours. BNP (last 3 results) No results for input(s): PROBNP in the last 8760 hours. HbA1C: No results for input(s): HGBA1C in the last 72 hours. CBG: Recent Labs  Lab 04/11/21 1421 04/12/21 1513  GLUCAP 89 105*   Lipid Profile: Recent Labs    04/12/21 0147  CHOL 151  HDL 40*  LDLCALC 89  TRIG 412  CHOLHDL 3.8   Thyroid Function  Tests: Recent Labs    04/12/21 1405  TSH 1.208   Anemia Panel: Recent Labs    04/13/21 0835  VITAMINB12 88*   Urine analysis:    Component Value Date/Time   COLORURINE COLORLESS (A) 04/11/2021 1444   APPEARANCEUR CLEAR 04/11/2021 1444   LABSPEC 1.009 04/11/2021 1444   PHURINE 7.5 04/11/2021 1444   GLUCOSEU NEGATIVE 04/11/2021 1444   HGBUR MODERATE (A) 04/11/2021 1444   BILIRUBINUR NEGATIVE 04/11/2021 1444   KETONESUR NEGATIVE 04/11/2021 1444   PROTEINUR NEGATIVE 04/11/2021 1444   NITRITE NEGATIVE 04/11/2021 1444   LEUKOCYTESUR NEGATIVE 04/11/2021 1444   Sepsis Labs: @LABRCNTIP (procalcitonin:4,lacticidven:4)  ) Recent Results (from the past 240 hour(s))  Resp Panel by RT-PCR (Flu A&B, Covid) Nasopharyngeal Swab     Status: None   Collection Time: 04/11/21  4:08 PM   Specimen: Nasopharyngeal Swab; Nasopharyngeal(NP) swabs in vial transport medium  Result Value Ref Range Status   SARS Coronavirus 2 by RT PCR NEGATIVE NEGATIVE Final    Comment: (NOTE) SARS-CoV-2 target nucleic acids are NOT DETECTED.  The SARS-CoV-2 RNA is generally detectable in upper respiratory specimens during the acute phase of infection. The lowest concentration of SARS-CoV-2 viral copies this assay can detect is 138 copies/mL. A negative result does not preclude SARS-Cov-2 infection and should not be used as the sole basis for treatment or other patient management decisions. A negative result may occur with  improper specimen collection/handling, submission of specimen other than nasopharyngeal swab, presence of viral mutation(s) within the areas targeted by this assay, and inadequate number of viral copies(<138 copies/mL). A negative result must be combined with clinical observations, patient history, and epidemiological information. The expected result is Negative.  Fact Sheet for Patients:  04/13/21  Fact Sheet for Healthcare Providers:   BloggerCourse.com  This test is no t yet approved or cleared by the [employer] FDA and  has been authorized for detection and/or diagnosis of SARS-CoV-2 by FDA under an Emergency Use Authorization (EUA). This EUA will remain  in effect (meaning this test can be used) for the duration of the COVID-19 declaration under Section 564(b)(1) of the Act, 21 U.S.C.section 360bbb-3(b)(1), unless the authorization is terminated  or revoked sooner.       Influenza A by PCR NEGATIVE NEGATIVE Final   Influenza B by PCR NEGATIVE NEGATIVE Final    Comment: (NOTE) The Xpert Xpress SARS-CoV-2/FLU/RSV plus assay is intended as an aid in the diagnosis of influenza from Nasopharyngeal swab specimens and should not be used as a sole basis for treatment. Nasal washings and aspirates are unacceptable for Xpert Xpress SARS-CoV-2/FLU/RSV testing.  Fact Sheet for Patients: [place]  Fact Sheet for Healthcare Providers: BloggerCourse.com  This test is  not yet approved or cleared by the [place] and has been authorized for detection and/or diagnosis of SARS-CoV-2 by FDA under an Emergency Use Authorization (EUA). This EUA will remain in effect (meaning this test can be used) for the duration of the COVID-19 declaration under Section 564(b)(1) of the Act, 21 U.S.C. section 360bbb-3(b)(1), unless the authorization is terminated or revoked.  Performed at [facility], [address], [place], [place] [address]   Culture, blood (routine x 2)     Status: None (Preliminary result)   Collection Time: 04/12/21  8:13 AM   Specimen: BLOOD  Result Value Ref Range Status   Specimen Description BLOOD BLOOD RIGHT HAND  Final   Special Requests   Final    BOTTLES DRAWN AEROBIC AND ANAEROBIC Blood Culture adequate volume   Culture   Final    NO GROWTH < 24 HOURS Performed at [facility],  [address], [place], [place] [address]    Report Status PENDING  Incomplete  Culture, blood (routine x 2)     Status: None (Preliminary result)   Collection Time: 04/12/21  8:27 AM   Specimen: BLOOD  Result Value Ref Range Status   Specimen Description BLOOD RIGHT ANTECUBITAL  Final   Special Requests   Final    BOTTLES DRAWN AEROBIC AND ANAEROBIC Blood Culture adequate volume   Culture  Setup Time   Final    GRAM POSITIVE RODS ANAEROBIC BOTTLE ONLY CRITICAL RESULT CALLED TO, READ BACK BY AND VERIFIED WITH: Lenny Pastel, AT 0710 04/13/21 Renato Shin Performed at [facility], [address], [place], [place] [address]    Culture GRAM POSITIVE RODS  Final   Report Status PENDING  Incomplete  CSF culture w Gram Stain     Status: None (Preliminary result)   Collection Time: 04/12/21  3:05 PM   Specimen: CSF; Cerebrospinal Fluid  Result Value Ref Range Status   Specimen Description CSF  Final   Special Requests NONE  Final   Gram Stain NO WBC SEEN NO ORGANISMS SEEN CYTOSPIN SMEAR   Final   Culture   Final    NO GROWTH < 24 HOURS Performed at [facility], [address] [place], [place] [address]    Report Status PENDING  Incomplete      Radiology Studies: CT HEAD WO CONTRAST  Result Date: 04/11/2021 CLINICAL DATA:  Pt arrives to ED with c/o an expressive aphasic episode today starting at 1pm where she could not speak words. Pt states this episode may of lasted a few minutes. Pt reports she started a new job and it requires he to talk to a lot of new people. Pt does reports headache before the event and headache yesterday after twist her neck and hearing a "poping" noise EXAM: CT HEAD WITHOUT CONTRAST TECHNIQUE: Contiguous axial images were obtained from the base of the skull through the vertex without intravenous contrast. COMPARISON:  None. FINDINGS: Brain: No evidence of acute infarction, hemorrhage, hydrocephalus, extra-axial collection or mass lesion/mass effect.  Vascular: No hyperdense vessel or unexpected calcification. Skull: Normal. Negative for fracture or focal lesion. Sinuses/Orbits: Visualized globes and orbits are unremarkable. Visualized sinuses are clear. Other: None. IMPRESSION: Normal unenhanced CT scan of the brain. Electronically Signed   By: Amie Portland M.D.   On: 04/11/2021 14:49   MR ANGIO HEAD WO CONTRAST  Result Date: 04/12/2021 CLINICAL DATA:  Initial evaluation for acute dysarthria. EXAM: MRI HEAD WITHOUT CONTRAST  MRA HEAD WITHOUT CONTRAST MRA NECK WITHOUT AND WITH CONTRAST TECHNIQUE: Multiplanar, multi-echo pulse sequences of the brain and surrounding structures were acquired without intravenous contrast. Angiographic images of the Circle of Willis were acquired using MRA technique without intravenous contrast. Angiographic images of the neck were acquired using MRA technique without and with intravenous contrast. Carotid stenosis measurements (when applicable) are obtained utilizing NASCET criteria, using the distal internal carotid diameter as the denominator. CONTRAST:  6mL GADAVIST GADOBUTROL 1 MMOL/ML IV SOLN COMPARISON:  Prior head CT from 04/11/2021. FINDINGS: MRI HEAD FINDINGS Brain: Cerebral volume within normal limits. Few scattered foci of patchy T2/FLAIR signal abnormality seen involving the periventricular and deep white matter both cerebral hemispheres, nonspecific, but suspected to reflect chronic microvascular ischemic disease, definitely advanced for age. Remote lacunar infarct present at the ventral medial left thalamus. A few tiny remote bilateral cerebellar infarcts noted. Patchy and confluent restricted diffusion involving primarily the cortex of the left temporal occipital and parietal region, consistent with acute ischemic infarct. A few suspected subtle foci of susceptibility artifact within the area of infarction, suggestive of minimal petechial hemorrhage. No frank hemorrhagic transformation or mass effect. There are  additional scattered subcentimeter foci of restricted diffusion involving the right basal ganglia (series 5, images 79, 78). Patchy right PCA distribution infarcts involving the right occipital lobe and right hippocampus (series 5, images 71, 70). Few punctate bilateral cerebellar infarcts (series 5, images 60, 66 additional punctate right frontal cortical infarct (series 5, image 88). No other associated hemorrhage or mass effect. No mass lesion or midline shift. No hydrocephalus or extra-axial fluid collection. Pituitary gland within normal limits for age. Midline structures intact. Vascular: Major intracranial vascular flow voids are maintained. Skull and upper cervical spine: Craniocervical junction within normal limits. Bone marrow signal intensity normal. No scalp soft tissue abnormality. Sinuses/Orbits: Globes and orbital soft tissues within normal limits. Paranasal sinuses are clear. No significant mastoid effusion. Inner ear structures grossly normal. Other: None. MRA HEAD FINDINGS ANTERIOR CIRCULATION: Visualized distal cervical segments of the internal carotid arteries are patent with antegrade flow. Petrous, cavernous, and supraclinoid segments patent without stenosis or other abnormality. A1 segments patent. Question 2 mm focal outpouching extending inferiorly from the anterior communicating artery complex (series 1037, image 8). Anterior cerebral arteries patent to their distal aspects without stenosis. No M1 stenosis or occlusion. Normal MCA bifurcations. MCA branches well perfused and symmetric. There is question of diffuse small vessel irregularity about the MCA branches bilaterally, best seen at the proximal left M2 anterior division (series 1037, image 8). POSTERIOR CIRCULATION: Both vertebral arteries patent to the vertebrobasilar junction without stenosis. Left vertebral artery slightly dominant. Both PICA origins patent and normal. Basilar patent to its distal aspect without stenosis. Superior  cerebellar arteries patent bilaterally. Both PCAs supplied via the basilar as well as small bilateral posterior communicating arteries. PCAs well perfused to their distal aspects without proximal stenosis. Again there is question of possible subtle distal small vessel irregularity. No intracranial aneurysm or other vascular abnormality. MRA NECK FINDINGS AORTIC ARCH: Visualized aortic arch normal in caliber. Aberrant right subclavian artery noted. Right CCA arises directly from the aortic arch. No significant vascular irregularity or stenosis about the origin of the great vessels. RIGHT CAROTID SYSTEM: Right common and internal carotid arteries widely patent without stenosis, evidence for dissection, or occlusion. LEFT CAROTID SYSTEM: Left common and internal carotid arteries widely patent without stenosis, evidence for dissection, or occlusion. VERTEBRAL ARTERIES: Both vertebral arteries arise from the subclavian arteries. Left  vertebral artery dominant. Vertebral arteries patent without stenosis, evidence for dissection, or occlusion. IMPRESSION: MRI HEAD: 1. 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 Possible minimal petechial hemorrhage without hemorrhagic transformation or mass effect. 2. Additional multifocal acute ischemic infarcts involving the right frontal lobe, right basal ganglia, right temporoccipital region, and cerebellum as above. No other associated hemorrhage or mass effect. 3. Underlying remote lacunar infarcts involving the left thalamus and cerebellum. 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 Changes are certainly advanced for patient age. Possible vasculitis would be the primary differential consideration given patient age and the presence of multiple acute and chronic infarcts. MRA HEAD: 1. Question subtle diffuse  small vessel irregularity about the intracranial circulation. Differential considerations include early atherosclerotic disease versus changes of vasculopathy. 2. Otherwise normal MRA appearance of the medium and large intracranial arterial vessels. No large vessel occlusion. No hemodynamically significant or correctable stenosis. 3. Question 2 mm focal outpouching arising from the anterior communicating artery complex, which could reflect focal vascular tortuosity versus a small aneurysm. Attention at follow-up recommended. MRA NECK: 1. Normal MRA of the neck. No hemodynamically significant stenosis or other acute vascular abnormality. 2. Aberrant right subclavian artery. Electronically Signed   By: Rise Mu M.D.   On: 04/12/2021 05:48   MR ANGIO NECK W WO CONTRAST  Result Date: 04/12/2021 CLINICAL DATA:  Initial evaluation for acute dysarthria. EXAM: MRI HEAD WITHOUT CONTRAST MRA HEAD WITHOUT CONTRAST MRA NECK WITHOUT AND WITH CONTRAST TECHNIQUE: Multiplanar, multi-echo pulse sequences of the brain and surrounding structures were acquired without intravenous contrast. Angiographic images of the Circle of Willis were acquired using MRA technique without intravenous contrast. Angiographic images of the neck were acquired using MRA technique without and with intravenous contrast. Carotid stenosis measurements (when applicable) are obtained utilizing NASCET criteria, using the distal internal carotid diameter as the denominator. CONTRAST:  6mL GADAVIST GADOBUTROL 1 MMOL/ML IV SOLN COMPARISON:  Prior head CT from 04/11/2021. FINDINGS: MRI HEAD FINDINGS Brain: Cerebral volume within normal limits. Few scattered foci of patchy T2/FLAIR signal abnormality seen involving the periventricular and deep white matter both cerebral hemispheres, nonspecific, but suspected to reflect chronic microvascular ischemic disease, definitely advanced for age. Remote lacunar infarct present at the ventral medial left  thalamus. A few tiny remote bilateral cerebellar infarcts noted. Patchy and confluent restricted diffusion involving primarily the cortex of the left temporal occipital and parietal region, consistent with acute ischemic infarct. A few suspected subtle foci of susceptibility artifact within the area of infarction, suggestive of minimal petechial hemorrhage. No frank hemorrhagic transformation or mass effect. There are additional scattered subcentimeter foci of restricted diffusion involving the right basal ganglia (series 5, images 79, 78). Patchy right PCA distribution infarcts involving the right occipital lobe and right hippocampus (series 5, images 71, 70). Few punctate bilateral cerebellar infarcts (series 5, images 60, 66 additional punctate right frontal cortical infarct (series 5, image 88). No other associated hemorrhage or mass effect. No mass lesion or midline shift. No hydrocephalus or extra-axial fluid collection. Pituitary gland within normal limits for age. Midline structures intact. Vascular: Major intracranial vascular flow voids are maintained. Skull and upper cervical spine: Craniocervical junction within normal limits. Bone marrow signal intensity normal. No scalp soft tissue abnormality. Sinuses/Orbits: Globes and orbital soft tissues within normal limits. Paranasal sinuses are clear. No significant mastoid effusion. Inner ear structures grossly normal. Other: None. MRA HEAD  FINDINGS ANTERIOR CIRCULATION: Visualized distal cervical segments of the internal carotid arteries are patent with antegrade flow. Petrous, cavernous, and supraclinoid segments patent without stenosis or other abnormality. A1 segments patent. Question 2 mm focal outpouching extending inferiorly from the anterior communicating artery complex (series 1037, image 8). Anterior cerebral arteries patent to their distal aspects without stenosis. No M1 stenosis or occlusion. Normal MCA bifurcations. MCA branches well perfused and  symmetric. There is question of diffuse small vessel irregularity about the MCA branches bilaterally, best seen at the proximal left M2 anterior division (series 1037, image 8). POSTERIOR CIRCULATION: Both vertebral arteries patent to the vertebrobasilar junction without stenosis. Left vertebral artery slightly dominant. Both PICA origins patent and normal. Basilar patent to its distal aspect without stenosis. Superior cerebellar arteries patent bilaterally. Both PCAs supplied via the basilar as well as small bilateral posterior communicating arteries. PCAs well perfused to their distal aspects without proximal stenosis. Again there is question of possible subtle distal small vessel irregularity. No intracranial aneurysm or other vascular abnormality. MRA NECK FINDINGS AORTIC ARCH: Visualized aortic arch normal in caliber. Aberrant right subclavian artery noted. Right CCA arises directly from the aortic arch. No significant vascular irregularity or stenosis about the origin of the great vessels. RIGHT CAROTID SYSTEM: Right common and internal carotid arteries widely patent without stenosis, evidence for dissection, or occlusion. LEFT CAROTID SYSTEM: Left common and internal carotid arteries widely patent without stenosis, evidence for dissection, or occlusion. VERTEBRAL ARTERIES: Both vertebral arteries arise from the subclavian arteries. Left vertebral artery dominant. Vertebral arteries patent without stenosis, evidence for dissection, or occlusion. IMPRESSION: MRI HEAD: 1. 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 Possible minimal petechial hemorrhage without hemorrhagic transformation or mass effect. 2. Additional multifocal acute ischemic infarcts involving the right frontal lobe, right basal ganglia, right temporoccipital region, and cerebellum as above. No other associated hemorrhage or mass effect. 3. Underlying remote lacunar infarcts involving the left  thalamus and cerebellum. 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 Changes are certainly advanced for patient age. Possible vasculitis would be the primary differential consideration given patient age and the presence of multiple acute and chronic infarcts. MRA HEAD: 1. Question subtle diffuse small vessel irregularity about the intracranial circulation. Differential considerations include early atherosclerotic disease versus changes of vasculopathy. 2. Otherwise normal MRA appearance of the medium and large intracranial arterial vessels. No large vessel occlusion. No hemodynamically significant or correctable stenosis. 3. Question 2 mm focal outpouching arising from the anterior communicating artery complex, which could reflect focal vascular tortuosity versus a small aneurysm. Attention at follow-up recommended. MRA NECK: 1. Normal MRA of the neck. No hemodynamically significant stenosis or other acute vascular abnormality. 2. Aberrant right subclavian artery. Electronically Signed   By: Rise Mu M.D.   On: 04/12/2021 05:48   MR BRAIN WO CONTRAST  Result Date: 04/12/2021 CLINICAL DATA:  Initial evaluation for acute dysarthria. EXAM: MRI HEAD WITHOUT CONTRAST MRA HEAD WITHOUT CONTRAST MRA NECK WITHOUT AND WITH CONTRAST TECHNIQUE: Multiplanar, multi-echo pulse sequences of the brain and surrounding structures were acquired without intravenous contrast. Angiographic images of the Circle of Willis were acquired using MRA technique without intravenous contrast. Angiographic images of the neck were acquired using MRA technique without and with intravenous contrast. Carotid stenosis measurements (when applicable) are obtained utilizing NASCET criteria, using the distal internal carotid diameter as the denominator. CONTRAST:  6mL GADAVIST GADOBUTROL  1 MMOL/ML IV SOLN  COMPARISON:  Prior head CT from 04/11/2021. FINDINGS: MRI HEAD FINDINGS Brain: Cerebral volume within normal limits. Few scattered foci of patchy T2/FLAIR signal abnormality seen involving the periventricular and deep white matter both cerebral hemispheres, nonspecific, but suspected to reflect chronic microvascular ischemic disease, definitely advanced for age. Remote lacunar infarct present at the ventral medial left thalamus. A few tiny remote bilateral cerebellar infarcts noted. Patchy and confluent restricted diffusion involving primarily the cortex of the left temporal occipital and parietal region, consistent with acute ischemic infarct. A few suspected subtle foci of susceptibility artifact within the area of infarction, suggestive of minimal petechial hemorrhage. No frank hemorrhagic transformation or mass effect. There are additional scattered subcentimeter foci of restricted diffusion involving the right basal ganglia (series 5, images 79, 78). Patchy right PCA distribution infarcts involving the right occipital lobe and right hippocampus (series 5, images 71, 70). Few punctate bilateral cerebellar infarcts (series 5, images 60, 66 additional punctate right frontal cortical infarct (series 5, image 88). No other associated hemorrhage or mass effect. No mass lesion or midline shift. No hydrocephalus or extra-axial fluid collection. Pituitary gland within normal limits for age. Midline structures intact. Vascular: Major intracranial vascular flow voids are maintained. Skull and upper cervical spine: Craniocervical junction within normal limits. Bone marrow signal intensity normal. No scalp soft tissue abnormality. Sinuses/Orbits: Globes and orbital soft tissues within normal limits. Paranasal sinuses are clear. No significant mastoid effusion. Inner ear structures grossly normal. Other: None. MRA HEAD FINDINGS ANTERIOR CIRCULATION: Visualized distal cervical segments of the internal carotid arteries are  patent with antegrade flow. Petrous, cavernous, and supraclinoid segments patent without stenosis or other abnormality. A1 segments patent. Question 2 mm focal outpouching extending inferiorly from the anterior communicating artery complex (series 1037, image 8). Anterior cerebral arteries patent to their distal aspects without stenosis. No M1 stenosis or occlusion. Normal MCA bifurcations. MCA branches well perfused and symmetric. There is question of diffuse small vessel irregularity about the MCA branches bilaterally, best seen at the proximal left M2 anterior division (series 1037, image 8). POSTERIOR CIRCULATION: Both vertebral arteries patent to the vertebrobasilar junction without stenosis. Left vertebral artery slightly dominant. Both PICA origins patent and normal. Basilar patent to its distal aspect without stenosis. Superior cerebellar arteries patent bilaterally. Both PCAs supplied via the basilar as well as small bilateral posterior communicating arteries. PCAs well perfused to their distal aspects without proximal stenosis. Again there is question of possible subtle distal small vessel irregularity. No intracranial aneurysm or other vascular abnormality. MRA NECK FINDINGS AORTIC ARCH: Visualized aortic arch normal in caliber. Aberrant right subclavian artery noted. Right CCA arises directly from the aortic arch. No significant vascular irregularity or stenosis about the origin of the great vessels. RIGHT CAROTID SYSTEM: Right common and internal carotid arteries widely patent without stenosis, evidence for dissection, or occlusion. LEFT CAROTID SYSTEM: Left common and internal carotid arteries widely patent without stenosis, evidence for dissection, or occlusion. VERTEBRAL ARTERIES: Both vertebral arteries arise from the subclavian arteries. Left vertebral artery dominant. Vertebral arteries patent without stenosis, evidence for dissection, or occlusion. IMPRESSION: MRI HEAD: 1. Acute ischemic cortical  infarct involving the left temporal occipital and parietal region. Possible minimal petechial hemorrhage without hemorrhagic transformation or mass effect. 2. Additional multifocal acute ischemic infarcts involving the right frontal lobe, right basal ganglia, right temporoccipital region, and cerebellum as above. No other associated hemorrhage or mass effect. 3. Underlying remote lacunar infarcts involving the left thalamus and cerebellum. 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 Changes are certainly advanced for patient age. Possible vasculitis would be the primary differential consideration given patient age and the presence of multiple acute and chronic infarcts. MRA HEAD: 1. Question subtle diffuse small vessel irregularity about the intracranial circulation. Differential considerations include early atherosclerotic disease versus changes of vasculopathy. 2. Otherwise normal MRA appearance of the medium and large intracranial arterial vessels. No large vessel occlusion. No hemodynamically significant or correctable stenosis. 3. Question 2 mm focal outpouching arising from the anterior communicating artery complex, which could reflect focal vascular tortuosity versus a small aneurysm. Attention at follow-up recommended. MRA NECK: 1. Normal MRA of the neck. No hemodynamically significant stenosis or other acute vascular abnormality. 2. Aberrant right subclavian artery. Electronically Signed   By: Rise Mu M.D.   On: 04/12/2021 05:48   EEG adult  Result Date: 04/12/2021 Charlsie Quest, MD     04/12/2021 12:34 PM Patient Name: Oriya Kettering MRN: [id number] Epilepsy Attending: Charlsie Quest Referring Physician/Provider: Dr Brooke Dare Date: 04/12/2021 Duration: 27.42 mins Patient history: 20 year old female with no significant medical history pertaining to strokes  presented for evaluation of expressive aphasia and was found to have left temporal, occipital, parietal region acute ischemic infarcts.   EEG to evaluate for seizures. Level of alertness: Awake AEDs during EEG study: None Technical aspects: This EEG study was done with scalp electrodes positioned according to the 10-20 International system of electrode placement. Electrical activity was acquired at a sampling rate of  and reviewed with a high frequency filter of  and a low frequency filter of . EEG data were recorded continuously and digitally stored. Description: The posterior dominant rhythm consists of 9-10 Hz activity of moderate voltage (25-35 uV) seen predominantly in posterior head regions, symmetric and reactive to eye opening and eye closing. EEG showed continuous 3 to 6 Hz theta-delta slowing in left hemisphere, maximal left temporal region which at times appears rhythmic. Hyperventilation and photic stimulation were not performed.   ABNORMALITY -Continuous slow, left hemisphere, maximal left temporal region IMPRESSION: This study is suggestive of cortical dysfunction left hemisphere, maximal left temporal region likely secondary to underlying stroke.  No seizures or definite epileptiform discharges were seen throughout the recording. Charlsie Quest   ECHOCARDIOGRAM COMPLETE BUBBLE STUDY  Result Date: 04/13/2021    ECHOCARDIOGRAM REPORT   Patient Name:   ABBEGAIL MATUSKA Date of Exam: 04/13/2021 Medical Rec #:  [id number]  Height:       66.0 in Accession #:    [id number] Weight:       131.8 lb Date of Birth:  10/20/2001  BSA:          1.675 m Patient Age:    19 years   BP:           132/88 mmHg Patient Gender: F          HR:           49 bpm. Exam Location:  Inpatient Procedure: 2D Echo, Cardiac Doppler and Color Doppler Indications:    Stroke  History:        Patient has no prior history of Echocardiogram examinations.  Sonographer:    Shirlean Kelly Referring Phys: [id number] Kara Mead  IMPRESSIONS  1. Left ventricular ejection fraction, by estimation, is 60 to 65%. The left ventricle has normal function. The left ventricle has no regional wall  motion abnormalities. There is mild left ventricular hypertrophy. Left ventricular diastolic parameters were normal.  2. Right ventricular systolic function is normal. The right ventricular size is normal.  3. The mitral valve is normal in structure. Trivial mitral valve regurgitation.  4. The aortic valve was not well visualized. Aortic valve regurgitation is moderate. No aortic stenosis is present.  5. The inferior vena cava is normal in size with greater than 50% respiratory variability, suggesting right atrial pressure of 3 mmHg.  6. Agitated saline contrast bubble study was negative, with no evidence of any interatrial shunt. Conclusion(s)/Recommendation(s): AV leaflets appear thickened with eccentric posterior directed moderate AI. Given presentation with acute CVA, recommend TEE for further evaluation. FINDINGS  Left Ventricle: Left ventricular ejection fraction, by estimation, is 60 to 65%. The left ventricle has normal function. The left ventricle has no regional wall motion abnormalities. The left ventricular internal cavity size was normal in size. There is  mild left ventricular hypertrophy. Left ventricular diastolic parameters were normal. Right Ventricle: The right ventricular size is normal. No increase in right ventricular wall thickness. Right ventricular systolic function is normal. Left Atrium: Left atrial size was normal in size. Right Atrium: Right atrial size was normal in size. Pericardium: There is no evidence of pericardial effusion. Mitral Valve: The mitral valve is normal in structure. Trivial mitral valve regurgitation. Tricuspid Valve: The tricuspid valve is normal in structure. Tricuspid valve regurgitation is not demonstrated. Aortic Valve: The aortic valve was not well visualized. Aortic valve regurgitation is moderate. No  aortic stenosis is present. Aortic valve mean gradient measures 2.7 mmHg. Aortic valve peak gradient measures 4.8 mmHg. Aortic valve area, by VTI measures 3.05 cm. Pulmonic Valve: The pulmonic valve was not well visualized. Pulmonic valve regurgitation is trivial. Aorta: The aortic root and ascending aorta are structurally normal, with no evidence of dilitation. Venous: The inferior vena cava is normal in size with greater than 50% respiratory variability, suggesting right atrial pressure of 3 mmHg. IAS/Shunts: No atrial level shunt detected by color flow Doppler. Agitated saline contrast was given intravenously to evaluate for intracardiac shunting. Agitated saline contrast bubble study was negative, with no evidence of any interatrial shunt.  LEFT VENTRICLE PLAX 2D LVIDd:         4.60 cm  Diastology LVIDs:         2.70 cm  LV e' medial:    11.70 cm/s LV PW:         0.90 cm  LV E/e' medial:  7.9 LV IVS:        1.00 cm  LV e' lateral:   17.20 cm/s LVOT diam:     2.30 cm  LV E/e' lateral: 5.4 LV SV:         83 LV SV Index:   50 LVOT Area:     4.15 cm  RIGHT VENTRICLE            IVC RV Basal diam:  3.20 cm    IVC diam: 1.30 cm RV S prime:     9.65 cm/s TAPSE (M-mode): 2.2 cm LEFT ATRIUM             Index       RIGHT ATRIUM           Index LA diam:        3.10 cm 1.85 cm/m  RA Area:     11.05 cm LA Vol (A2C):   46.9 ml 28.00 ml/m RA Volume:   23.80 ml  14.21 ml/m LA Vol (A4C):   56.2 ml 33.55 ml/m LA Biplane Vol: 53.0 ml 31.64 ml/m  AORTIC VALVE AV Area (Vmax):    3.57 cm AV Area (Vmean):   3.20 cm AV Area (VTI):     3.05 cm AV Vmax:           109.67 cm/s AV Vmean:          73.133 cm/s AV VTI:            0.272 m AV Peak Grad:      4.8 mmHg AV Mean Grad:      2.7 mmHg LVOT Vmax:         94.10 cm/s LVOT Vmean:        56.300 cm/s LVOT VTI:          0.200 m LVOT/AV VTI ratio: 0.74  AORTA Ao Root diam: 2.70 cm Ao Asc diam:  2.40 cm MITRAL VALVE MV Area (PHT): 4.06 cm    SHUNTS MV Decel Time: 187 msec    Systemic  VTI:  0.20 m MV E velocity: 92.40 cm/s  Systemic Diam: 2.30 cm MV A velocity: 53.10 cm/s MV E/A ratio:  1.74 Epifanio Lesches MD Electronically signed by Epifanio Lesches MD Signature Date/Time: 04/13/2021/10:43:59 AM    Final      Scheduled Meds: . aspirin EC  81 mg Oral Daily  . atorvastatin  40 mg Oral QHS  . lidocaine       Continuous Infusions: . sodium chloride 100 mL/hr at 04/13/21 0400     LOS: 1 day   Time Spent in minutes   45 minutes  Ramona Ruark D.O. on 04/13/2021 at 12:01 PM  Between 7am to 7pm - Please see pager noted on [email or web address]  After 7pm go to [email or web address]  And look for the night coverage person covering for me after hours  [facility] Office  [phone]

## 2021-04-13 NOTE — Progress Notes (Addendum)
    [facility] has been requested to perform a transesophageal echocardiogram on Meghan Williamson for stroke.  After careful review of history and examination, the risks and benefits of transesophageal echocardiogram have been explained including risks of esophageal damage, perforation (1:10,000 risk), bleeding, pharyngeal hematoma as well as other potential complications associated with conscious sedation including aspiration, arrhythmia, respiratory failure and death. Alternatives to treatment were discussed, questions were answered. Mother at bedside and also agrees with the procedure. Patient is willing to proceed.   Pt is scheduled for TEE tomorrow at 2 pm with Dr. Shari Prows. NPO at MN please.   Meghan Williamson, [place]  04/13/2021 3:30 PM

## 2021-04-13 NOTE — Progress Notes (Addendum)
STROKE TEAM PROGRESS NOTE   SUBJECTIVE (INTERVAL HISTORY) Her parents are at the bedside.  Overall her condition is gradually improving.  She does have cerebral angiogram with Dr. Norma Fredrickson, which was negative for vasculitis.  LP done yesterday showed no evidence of inflammation or infection.  LE venous Doppler negative for DVT, 2D echo unremarkable.   OBJECTIVE Temp:  [98 F (36.7 C)-98.6 F (37 C)] 98 F (36.7 C) (05/31 0356) Pulse Rate:  [49-98] 62 (05/31 1537) Cardiac Rhythm: Normal sinus rhythm (05/31 1125) Resp:  [10-26] 18 (05/31 1205) BP: (114-155)/(75-102) 135/86 (05/31 1537) SpO2:  [98 %-100 %] 100 % (05/31 1537)  Recent Labs  Lab 04/11/21 1421 04/12/21 1513  GLUCAP 89 105*   Recent Labs  Lab 04/11/21 1444  NA 137  K 4.0  CL 105  CO2 24  GLUCOSE 87  BUN 10  CREATININE 0.79  CALCIUM 9.7   Recent Labs  Lab 04/11/21 1444  AST 12*  ALT 9  ALKPHOS 45  BILITOT 0.4  PROT 7.2  ALBUMIN 4.4   Recent Labs  Lab 04/11/21 1444  WBC 4.8  NEUTROABS 2.4  HGB 13.0  HCT 38.7  MCV 81.8  PLT 266   No results for input(s): CKTOTAL, CKMB, CKMBINDEX, TROPONINI in the last 168 hours. Recent Labs    04/11/21 1444  LABPROT 14.0  INR 1.1   Recent Labs    04/11/21 1444  COLORURINE COLORLESS*  LABSPEC 1.009  PHURINE 7.5  GLUCOSEU NEGATIVE  HGBUR MODERATE*  BILIRUBINUR NEGATIVE  KETONESUR NEGATIVE  PROTEINUR NEGATIVE  NITRITE NEGATIVE  LEUKOCYTESUR NEGATIVE       Component Value Date/Time   CHOL 151 04/12/2021 0147   TRIG 110 04/12/2021 0147   HDL 40 (L) 04/12/2021 0147   CHOLHDL 3.8 04/12/2021 0147   VLDL 22 04/12/2021 0147   LDLCALC 89 04/12/2021 0147   Lab Results  Component Value Date   HGBA1C QNSTST 04/12/2021      Component Value Date/Time   LABOPIA NONE DETECTED 04/11/2021 1444   COCAINSCRNUR NONE DETECTED 04/11/2021 1444   LABBENZ NONE DETECTED 04/11/2021 1444   AMPHETMU NONE DETECTED 04/11/2021 1444   THCU POSITIVE (A) 04/11/2021 1444    LABBARB NONE DETECTED 04/11/2021 1444    Recent Labs  Lab 04/11/21 1444  ETH <10    I have personally reviewed the radiological images below and agree with the radiology interpretations.  CT HEAD WO CONTRAST  Result Date: 04/11/2021 CLINICAL DATA:  Pt arrives to ED with c/o an expressive aphasic episode today starting at 1pm where she could not speak words. Pt states this episode may of lasted a few minutes. Pt reports she started a new job and it requires he to talk to a lot of new people. Pt does reports headache before the event and headache yesterday after twist her neck and hearing a "poping" noise EXAM: CT HEAD WITHOUT CONTRAST TECHNIQUE: Contiguous axial images were obtained from the base of the skull through the vertex without intravenous contrast. COMPARISON:  None. FINDINGS: Brain: No evidence of acute infarction, hemorrhage, hydrocephalus, extra-axial collection or mass lesion/mass effect. Vascular: No hyperdense vessel or unexpected calcification. Skull: Normal. Negative for fracture or focal lesion. Sinuses/Orbits: Visualized globes and orbits are unremarkable. Visualized sinuses are clear. Other: None. IMPRESSION: Normal unenhanced CT scan of the brain. Electronically Signed   By: Lajean Manes M.D.   On: 04/11/2021 14:49   MR ANGIO HEAD WO CONTRAST  Result Date: 04/12/2021 CLINICAL DATA:  Initial evaluation for  acute dysarthria. EXAM: MRI HEAD WITHOUT CONTRAST MRA HEAD WITHOUT CONTRAST MRA NECK WITHOUT AND WITH CONTRAST TECHNIQUE: Multiplanar, multi-echo pulse sequences of the brain and surrounding structures were acquired without intravenous contrast. Angiographic images of the Circle of Willis were acquired using MRA technique without intravenous contrast. Angiographic images of the neck were acquired using MRA technique without and with intravenous contrast. Carotid stenosis measurements (when applicable) are obtained utilizing NASCET criteria, using the distal internal carotid  diameter as the denominator. CONTRAST:  74m GADAVIST GADOBUTROL 1 MMOL/ML IV SOLN COMPARISON:  Prior head CT from 04/11/2021. FINDINGS: MRI HEAD FINDINGS Brain: Cerebral volume within normal limits. Few scattered foci of patchy T2/FLAIR signal abnormality seen involving the periventricular and deep white matter both cerebral hemispheres, nonspecific, but suspected to reflect chronic microvascular ischemic disease, definitely advanced for age. Remote lacunar infarct present at the ventral medial left thalamus. A few tiny remote bilateral cerebellar infarcts noted. Patchy and confluent restricted diffusion involving primarily the cortex of the left temporal occipital and parietal region, consistent with acute ischemic infarct. A few suspected subtle foci of susceptibility artifact within the area of infarction, suggestive of minimal petechial hemorrhage. No frank hemorrhagic transformation or mass effect. There are additional scattered subcentimeter foci of restricted diffusion involving the right basal ganglia (series 5, images 79, 78). Patchy right PCA distribution infarcts involving the right occipital lobe and right hippocampus (series 5, images 71, 70). Few punctate bilateral cerebellar infarcts (series 5, images 60, 66 additional punctate right frontal cortical infarct (series 5, image 88). No other associated hemorrhage or mass effect. No mass lesion or midline shift. No hydrocephalus or extra-axial fluid collection. Pituitary gland within normal limits for age. Midline structures intact. Vascular: Major intracranial vascular flow voids are maintained. Skull and upper cervical spine: Craniocervical junction within normal limits. Bone marrow signal intensity normal. No scalp soft tissue abnormality. Sinuses/Orbits: Globes and orbital soft tissues within normal limits. Paranasal sinuses are clear. No significant mastoid effusion. Inner ear structures grossly normal. Other: None. MRA HEAD FINDINGS ANTERIOR  CIRCULATION: Visualized distal cervical segments of the internal carotid arteries are patent with antegrade flow. Petrous, cavernous, and supraclinoid segments patent without stenosis or other abnormality. A1 segments patent. Question 2 mm focal outpouching extending inferiorly from the anterior communicating artery complex (series 1037, image 8). Anterior cerebral arteries patent to their distal aspects without stenosis. No M1 stenosis or occlusion. Normal MCA bifurcations. MCA branches well perfused and symmetric. There is question of diffuse small vessel irregularity about the MCA branches bilaterally, best seen at the proximal left M2 anterior division (series 1037, image 8). POSTERIOR CIRCULATION: Both vertebral arteries patent to the vertebrobasilar junction without stenosis. Left vertebral artery slightly dominant. Both PICA origins patent and normal. Basilar patent to its distal aspect without stenosis. Superior cerebellar arteries patent bilaterally. Both PCAs supplied via the basilar as well as small bilateral posterior communicating arteries. PCAs well perfused to their distal aspects without proximal stenosis. Again there is question of possible subtle distal small vessel irregularity. No intracranial aneurysm or other vascular abnormality. MRA NECK FINDINGS AORTIC ARCH: Visualized aortic arch normal in caliber. Aberrant right subclavian artery noted. Right CCA arises directly from the aortic arch. No significant vascular irregularity or stenosis about the origin of the great vessels. RIGHT CAROTID SYSTEM: Right common and internal carotid arteries widely patent without stenosis, evidence for dissection, or occlusion. LEFT CAROTID SYSTEM: Left common and internal carotid arteries widely patent without stenosis, evidence for dissection, or occlusion. VERTEBRAL ARTERIES: Both vertebral  arteries arise from the subclavian arteries. Left vertebral artery dominant. Vertebral arteries patent without stenosis,  evidence for dissection, or occlusion. IMPRESSION: MRI HEAD: 1. 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 Possible minimal petechial hemorrhage without hemorrhagic transformation or mass effect. 2. Additional multifocal acute ischemic infarcts involving the right frontal lobe, right basal ganglia, right temporoccipital region, and cerebellum as above. No other associated hemorrhage or mass effect. 3. Underlying remote lacunar infarcts involving the left thalamus and cerebellum. 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 Changes are certainly advanced for patient age. Possible vasculitis would be the primary differential consideration given patient age and the presence of multiple acute and chronic infarcts. MRA HEAD: 1. Question subtle diffuse small vessel irregularity about the intracranial circulation. Differential considerations include early atherosclerotic disease versus changes of vasculopathy. 2. Otherwise normal MRA appearance of the medium and large intracranial arterial vessels. No large vessel occlusion. No hemodynamically significant or correctable stenosis. 3. Question 2 mm focal outpouching arising from the anterior communicating artery complex, which could reflect focal vascular tortuosity versus a small aneurysm. Attention at follow-up recommended. MRA NECK: 1. Normal MRA of the neck. No hemodynamically significant stenosis or other acute vascular abnormality. 2. Aberrant right subclavian artery. Electronically Signed   By: Jeannine Boga M.D.   On: 04/12/2021 05:48   MR ANGIO NECK W WO CONTRAST  Result Date: 04/12/2021 CLINICAL DATA:  Initial evaluation for acute dysarthria. EXAM: MRI HEAD WITHOUT CONTRAST MRA HEAD WITHOUT CONTRAST MRA NECK WITHOUT AND WITH CONTRAST TECHNIQUE: Multiplanar, multi-echo pulse  sequences of the brain and surrounding structures were acquired without intravenous contrast. Angiographic images of the Circle of Willis were acquired using MRA technique without intravenous contrast. Angiographic images of the neck were acquired using MRA technique without and with intravenous contrast. Carotid stenosis measurements (when applicable) are obtained utilizing NASCET criteria, using the distal internal carotid diameter as the denominator. CONTRAST:  53m GADAVIST GADOBUTROL 1 MMOL/ML IV SOLN COMPARISON:  Prior head CT from 04/11/2021. FINDINGS: MRI HEAD FINDINGS Brain: Cerebral volume within normal limits. Few scattered foci of patchy T2/FLAIR signal abnormality seen involving the periventricular and deep white matter both cerebral hemispheres, nonspecific, but suspected to reflect chronic microvascular ischemic disease, definitely advanced for age. Remote lacunar infarct present at the ventral medial left thalamus. A few tiny remote bilateral cerebellar infarcts noted. Patchy and confluent restricted diffusion involving primarily the cortex of the left temporal occipital and parietal region, consistent with acute ischemic infarct. A few suspected subtle foci of susceptibility artifact within the area of infarction, suggestive of minimal petechial hemorrhage. No frank hemorrhagic transformation or mass effect. There are additional scattered subcentimeter foci of restricted diffusion involving the right basal ganglia (series 5, images 79, 78). Patchy right PCA distribution infarcts involving the right occipital lobe and right hippocampus (series 5, images 71, 70). Few punctate bilateral cerebellar infarcts (series 5, images 60, 66 additional punctate right frontal cortical infarct (series 5, image 88). No other associated hemorrhage or mass effect. No mass lesion or midline shift. No hydrocephalus or extra-axial fluid collection. Pituitary gland within normal limits for age. Midline structures intact.  Vascular: Major intracranial vascular flow voids are maintained. Skull and upper cervical spine: Craniocervical junction within normal limits. Bone marrow signal intensity normal. No scalp soft tissue abnormality. Sinuses/Orbits: Globes and orbital soft tissues within normal limits. Paranasal sinuses are clear. No significant mastoid effusion. IIngalls  ear structures grossly normal. Other: None. MRA HEAD FINDINGS ANTERIOR CIRCULATION: Visualized distal cervical segments of the internal carotid arteries are patent with antegrade flow. Petrous, cavernous, and supraclinoid segments patent without stenosis or other abnormality. A1 segments patent. Question 2 mm focal outpouching extending inferiorly from the anterior communicating artery complex (series 1037, image 8). Anterior cerebral arteries patent to their distal aspects without stenosis. No M1 stenosis or occlusion. Normal MCA bifurcations. MCA branches well perfused and symmetric. There is question of diffuse small vessel irregularity about the MCA branches bilaterally, best seen at the proximal left M2 anterior division (series 1037, image 8). POSTERIOR CIRCULATION: Both vertebral arteries patent to the vertebrobasilar junction without stenosis. Left vertebral artery slightly dominant. Both PICA origins patent and normal. Basilar patent to its distal aspect without stenosis. Superior cerebellar arteries patent bilaterally. Both PCAs supplied via the basilar as well as small bilateral posterior communicating arteries. PCAs well perfused to their distal aspects without proximal stenosis. Again there is question of possible subtle distal small vessel irregularity. No intracranial aneurysm or other vascular abnormality. MRA NECK FINDINGS AORTIC ARCH: Visualized aortic arch normal in caliber. Aberrant right subclavian artery noted. Right CCA arises directly from the aortic arch. No significant vascular irregularity or stenosis about the origin of the great vessels.  RIGHT CAROTID SYSTEM: Right common and internal carotid arteries widely patent without stenosis, evidence for dissection, or occlusion. LEFT CAROTID SYSTEM: Left common and internal carotid arteries widely patent without stenosis, evidence for dissection, or occlusion. VERTEBRAL ARTERIES: Both vertebral arteries arise from the subclavian arteries. Left vertebral artery dominant. Vertebral arteries patent without stenosis, evidence for dissection, or occlusion. IMPRESSION: MRI HEAD: 1. 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 Possible minimal petechial hemorrhage without hemorrhagic transformation or mass effect. 2. Additional multifocal acute ischemic infarcts involving the right frontal lobe, right basal ganglia, right temporoccipital region, and cerebellum as above. No other associated hemorrhage or mass effect. 3. Underlying remote lacunar infarcts involving the left thalamus and cerebellum. 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 Changes are certainly advanced for patient age. Possible vasculitis would be the primary differential consideration given patient age and the presence of multiple acute and chronic infarcts. MRA HEAD: 1. Question subtle diffuse small vessel irregularity about the intracranial circulation. Differential considerations include early atherosclerotic disease versus changes of vasculopathy. 2. Otherwise normal MRA appearance of the medium and large intracranial arterial vessels. No large vessel occlusion. No hemodynamically significant or correctable stenosis. 3. Question 2 mm focal outpouching arising from the anterior communicating artery complex, which could reflect focal vascular tortuosity versus a small aneurysm. Attention at follow-up recommended. MRA NECK: 1. Normal MRA of the neck. No hemodynamically  significant stenosis or other acute vascular abnormality. 2. Aberrant right subclavian artery. Electronically Signed   By: Jeannine Boga M.D.   On: 04/12/2021 05:48   MR BRAIN WO CONTRAST  Result Date: 04/12/2021 CLINICAL DATA:  Initial evaluation for acute dysarthria. EXAM: MRI HEAD WITHOUT CONTRAST MRA HEAD WITHOUT CONTRAST MRA NECK WITHOUT AND WITH CONTRAST TECHNIQUE: Multiplanar, multi-echo pulse sequences of the brain and surrounding structures were acquired without intravenous contrast. Angiographic images of the Circle of Willis were acquired using MRA technique without intravenous contrast. Angiographic images of the neck were acquired using MRA technique without and with intravenous contrast. Carotid stenosis measurements (when applicable) are obtained utilizing NASCET criteria, using the distal internal carotid diameter  as the denominator. CONTRAST:  72m GADAVIST GADOBUTROL 1 MMOL/ML IV SOLN COMPARISON:  Prior head CT from 04/11/2021. FINDINGS: MRI HEAD FINDINGS Brain: Cerebral volume within normal limits. Few scattered foci of patchy T2/FLAIR signal abnormality seen involving the periventricular and deep white matter both cerebral hemispheres, nonspecific, but suspected to reflect chronic microvascular ischemic disease, definitely advanced for age. Remote lacunar infarct present at the ventral medial left thalamus. A few tiny remote bilateral cerebellar infarcts noted. Patchy and confluent restricted diffusion involving primarily the cortex of the left temporal occipital and parietal region, consistent with acute ischemic infarct. A few suspected subtle foci of susceptibility artifact within the area of infarction, suggestive of minimal petechial hemorrhage. No frank hemorrhagic transformation or mass effect. There are additional scattered subcentimeter foci of restricted diffusion involving the right basal ganglia (series 5, images 79, 78). Patchy right PCA distribution infarcts involving the  right occipital lobe and right hippocampus (series 5, images 71, 70). Few punctate bilateral cerebellar infarcts (series 5, images 60, 66 additional punctate right frontal cortical infarct (series 5, image 88). No other associated hemorrhage or mass effect. No mass lesion or midline shift. No hydrocephalus or extra-axial fluid collection. Pituitary gland within normal limits for age. Midline structures intact. Vascular: Major intracranial vascular flow voids are maintained. Skull and upper cervical spine: Craniocervical junction within normal limits. Bone marrow signal intensity normal. No scalp soft tissue abnormality. Sinuses/Orbits: Globes and orbital soft tissues within normal limits. Paranasal sinuses are clear. No significant mastoid effusion. Inner ear structures grossly normal. Other: None. MRA HEAD FINDINGS ANTERIOR CIRCULATION: Visualized distal cervical segments of the internal carotid arteries are patent with antegrade flow. Petrous, cavernous, and supraclinoid segments patent without stenosis or other abnormality. A1 segments patent. Question 2 mm focal outpouching extending inferiorly from the anterior communicating artery complex (series 1037, image 8). Anterior cerebral arteries patent to their distal aspects without stenosis. No M1 stenosis or occlusion. Normal MCA bifurcations. MCA branches well perfused and symmetric. There is question of diffuse small vessel irregularity about the MCA branches bilaterally, best seen at the proximal left M2 anterior division (series 1037, image 8). POSTERIOR CIRCULATION: Both vertebral arteries patent to the vertebrobasilar junction without stenosis. Left vertebral artery slightly dominant. Both PICA origins patent and normal. Basilar patent to its distal aspect without stenosis. Superior cerebellar arteries patent bilaterally. Both PCAs supplied via the basilar as well as small bilateral posterior communicating arteries. PCAs well perfused to their distal aspects  without proximal stenosis. Again there is question of possible subtle distal small vessel irregularity. No intracranial aneurysm or other vascular abnormality. MRA NECK FINDINGS AORTIC ARCH: Visualized aortic arch normal in caliber. Aberrant right subclavian artery noted. Right CCA arises directly from the aortic arch. No significant vascular irregularity or stenosis about the origin of the great vessels. RIGHT CAROTID SYSTEM: Right common and internal carotid arteries widely patent without stenosis, evidence for dissection, or occlusion. LEFT CAROTID SYSTEM: Left common and internal carotid arteries widely patent without stenosis, evidence for dissection, or occlusion. VERTEBRAL ARTERIES: Both vertebral arteries arise from the subclavian arteries. Left vertebral artery dominant. Vertebral arteries patent without stenosis, evidence for dissection, or occlusion. IMPRESSION: MRI HEAD: 1. 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 Possible minimal petechial hemorrhage without hemorrhagic transformation or mass effect. 2. Additional multifocal acute ischemic infarcts involving the right frontal lobe, right basal ganglia, right temporoccipital region, and cerebellum as above. No other associated hemorrhage or mass effect. 3. Underlying remote lacunar infarcts  involving the left thalamus and cerebellum. 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 Changes are certainly advanced for patient age. Possible vasculitis would be the primary differential consideration given patient age and the presence of multiple acute and chronic infarcts. MRA HEAD: 1. Question subtle diffuse small vessel irregularity about the intracranial circulation. Differential considerations include early atherosclerotic disease versus changes of vasculopathy. 2. Otherwise  normal MRA appearance of the medium and large intracranial arterial vessels. No large vessel occlusion. No hemodynamically significant or correctable stenosis. 3. Question 2 mm focal outpouching arising from the anterior communicating artery complex, which could reflect focal vascular tortuosity versus a small aneurysm. Attention at follow-up recommended. MRA NECK: 1. Normal MRA of the neck. No hemodynamically significant stenosis or other acute vascular abnormality. 2. Aberrant right subclavian artery. Electronically Signed   By: Jeannine Boga M.D.   On: 04/12/2021 05:48   IR US Guide Vasc Access Right  Result Date: 04/13/2021 INDICATION: Larua Collier is a 20 year old female with a past medical history significant for asthma and anxiety. She presented to outside hospital on 04/11/2021 with speech difficulty. She was transferred and admitted to [facility] for stroke work-up. 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 A diagnostic cerebral angiogram was requested to evaluate cerebral vasculature. EXAM: ULTRASOUND-GUIDED VASCULAR ACCESS DIAGNOSTIC CEREBRAL ANGIOGRAM COMPARISON:  MR angiogram Apr 12, 2021 MEDICATIONS: None. ANESTHESIA/SEDATION: Versed 1 mg IV; Fentanyl 50 mcg IV Moderate Sedation Time:  1 hour and 16 minutes The patient was continuously monitored during the procedure by the interventional radiology nurse under my direct supervision. CONTRAST:  75 mL of Omnipaque 240 milligrams/mL FLUOROSCOPY TIME:  Fluoroscopy Time: 13 minutes 36 seconds (281.6 mGy). COMPLICATIONS: None immediate. TECHNIQUE: Informed written consent was obtained from the patient and her mother after a thorough discussion of the procedural risks, benefits and alternatives. All questions were addressed. Maximal Sterile Barrier Technique was utilized including caps, mask, sterile gowns, sterile gloves, sterile drape, hand hygiene and skin antiseptic.  A timeout was performed prior to the initiation of the procedure. Using the modified Seldinger technique and a micropuncture kit, access was gained to the distal right radial artery at the anatomical snuffbox and a 5 French sheath was placed. Real-time ultrasound guidance was utilized for vascular access including the acquisition of a permanent ultrasound image documenting patency of the accessed vessel. Slow intra arterial infusion of 5,000 IU heparin, 5 mg Verapamil and 175 mcg nitroglicerin diluted in patient's own blood was performed. No significant fluctuation in patient's blood pressure seen. Then, a right radial artery roadmap was obtained via sheath side port. Normal brachial artery branching pattern seen. No significant anatomical variation. The right radial artery caliber is adequate for vascular access. Next, a 5 [place] Simmons 2 glide catheter was navigated over a 0.035" Terumo Glidewire into the right subclavian artery under fluoroscopic guidance. Frontal right subclavian artery angiogram was obtained. Due to inability to access the aortic arch from the aberrant right subclavian artery, decision was made to proceed with femoral access. The right groin was prepped and draped in the usual sterile fashion. Using a micropuncture kit and the modified Seldinger technique, access was gained to the right common femoral artery and a 5 French sheath was placed. Under fluoroscopy, a 5 [place] Berenstein 2 catheter was navigated over a 0.035" Terumo Glidewire into the aortic arch. The catheter was placed  into the right common carotid artery. Frontal and lateral angiograms of the neck were obtained. Under biplane roadmap, the catheter was advanced into the right internal carotid artery. Frontal and lateral angiograms of the head were obtained followed by right anterior oblique and magnified lateral views of the head. The catheter was then retracted into the right common carotid artery and under fluoroscopy advanced  into the right external carotid artery. Frontal and lateral angiograms of the head were obtained. The catheter was then placed into the left subclavian artery and advanced into the left vertebral artery. Frontal, lateral waters and magnified lateral views of the head were obtained. The catheter was then advanced into the left common carotid artery. Frontal and lateral views of the neck were obtained. Under biplane roadmap, the catheter was advanced into the left internal carotid artery. Frontal, lateral, magnified left anterior oblique and magnified lateral views of the head were obtained. The catheter was then retracted and advanced into the left external carotid artery. Frontal and lateral angiograms of the head were obtained. The catheter was subsequently advanced into the right vertebral artery. Frontal and lateral views of the head were obtained. The catheter was subsequently withdrawn. A 5 French Perclose was utilized for right femoral access closure. Hemostasis was achieved after approximately 5 minutes of manual pressure hold. An inflatable band was placed and inflated over the right hand access site. The vascular sheath was withdrawn and the band was slowly deflated until brisk flow was noted through the arteriotomy site. At this point, the band was reinflated with additional 2 cc of air to obtain patent hemostasis. FINDINGS: Right radial artery ultrasound and right radial artery angiogram: The caliber of the distal right radial artery is appropriate for angiogram access. The right radial artery and the right ulnar artery have normal course and caliber. No significant anatomical variants noted. Right subclavian angiograms: Aberrant right subclavian artery is noted. No opacification of the right vertebral artery. Otherwise, the visualized right subclavian artery and visualized branches of the thyrocervical trunk are unremarkable. Right common femoral artery ultrasound: Normal course and caliber of the right  common femoral artery. Right CCA angiograms: Cervical angiograms show normal course and caliber of the visualized right common carotid and internal carotid arteries. There are no significant stenoses. Right ICA angiograms: There is brisk vascular contrast filling of the ACA and MCA vascular trees. Luminal caliber is smooth and tapering. No aneurysms or abnormally high-flow, early draining veins are seen. No regions of abnormal hypervascularity are noted. The visualized dural sinuses are patent. Right ECA and right occipital angiograms: No early venous drainage was noted. The intracranial branches of the right external carotid artery are unremarkable. Left vertebral artery angiograms: The left vertebral artery, basilar artery, and bilateral posterior cerebral arteries are unremarkable. Small infundibulum at the origin of a thalami perforator branch from the left P1/PCA is incidentally noted. Luminal caliber is smooth and tapering. No aneurysms or abnormally high-flow, early draining veins are seen. No regions of abnormal hypervascularity are noted. The visualized dural sinuses are patent. Left CCA angiograms: Cervical angiograms show normal course and caliber of the visualized left common carotid and internal carotid arteries. There are no significant stenoses. Left ICA angiograms: There is brisk vascular contrast filling of the the ACA and MCA vascular trees. Luminal caliber is smooth and tapering. No aneurysms or abnormally high-flow, early draining veins are seen. No regions of abnormal hypervascularity are noted. The visualized dural sinuses are patent. Left ECA angiograms: No early venous drainage was noted.  The intracranial branches of the left external carotid artery are unremarkable. Right vertebral artery angiograms: The right vertebral artery originates from the right common carotid artery. The right vertebral artery, basilar artery, and bilateral posterior cerebral arteries are unremarkable. Luminal  caliber is smooth and tapering. No aneurysms or abnormally high-flow, early draining veins are seen. No regions of abnormal hypervascularity are noted. The visualized dural sinuses are patent. PROCEDURE: Not applicable. IMPRESSION: No evidence of luminal irregularity, hemodynamically significant stenosis, occlusion or other significant vascular abnormality to explain patient's recent cerebral infarcts. No aneurysm, AVM or dural AV fistula. 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 PLAN: Results communicated to Dr. Erlinda Hong via secure text paging immediately after the angiogram was finalized. Electronically Signed   By: Pedro Earls M.D.   On: 04/13/2021 15:31   EEG adult  Result Date: 04/12/2021 Lora Havens, MD     04/12/2021 12:34 PM Patient Name: Newell Frater MRN: [id number] Epilepsy Attending: Lora Havens Referring Physician/Provider: Dr Lesleigh Noe Date: 04/12/2021 Duration: 27.42 mins Patient history: 20 year old female with no significant medical history pertaining to strokes presented for evaluation of expressive aphasia and was found to have left temporal, occipital, parietal region acute ischemic infarcts.   EEG to evaluate for seizures. Level of alertness: Awake AEDs during EEG study: None Technical aspects: This EEG study was done with scalp electrodes positioned according to the 10-20 International system of electrode placement. Electrical activity was acquired at a sampling rate of 500Hz and reviewed with a high frequency filter of 70Hz and a low frequency filter of 1Hz. EEG data were recorded continuously and digitally stored. Description: The posterior dominant rhythm consists of 9-10 Hz activity of moderate voltage (25-35 uV) seen predominantly in posterior head regions, symmetric and reactive to eye opening and eye closing. EEG showed continuous 3 to 6 Hz theta-delta slowing in left  hemisphere, maximal left temporal region which at times appears rhythmic. Hyperventilation and photic stimulation were not performed.   ABNORMALITY -Continuous slow, left hemisphere, maximal left temporal region IMPRESSION: This study is suggestive of cortical dysfunction left hemisphere, maximal left temporal region likely secondary to underlying stroke.  No seizures or definite epileptiform discharges were seen throughout the recording. Lora Havens   ECHOCARDIOGRAM COMPLETE BUBBLE STUDY  Result Date: 04/13/2021    ECHOCARDIOGRAM REPORT   Patient Name:   JOELLYN GRANDT Date of Exam: 04/13/2021 Medical Rec #:  [id number]  Height:       66.0 in Accession #:    [id number] Weight:       131.8 lb Date of Birth:  2001-07-19  BSA:          1.675 m Patient Age:    19 years   BP:           132/88 mmHg Patient Gender: F          HR:           49 bpm. Exam Location:  Inpatient Procedure: 2D Echo, Cardiac Doppler and Color Doppler Indications:    Stroke  History:        Patient has no prior history of Echocardiogram examinations.  Sonographer:    Cammy Brochure Referring Phys: [id number] [place]  1. Left ventricular ejection fraction, by estimation, is 60 to 65%. The left ventricle has normal function. The left ventricle has no regional wall motion abnormalities. There is mild left  ventricular hypertrophy. Left ventricular diastolic parameters were normal.  2. Right ventricular systolic function is normal. The right ventricular size is normal.  3. The mitral valve is normal in structure. Trivial mitral valve regurgitation.  4. The aortic valve was not well visualized. Aortic valve regurgitation is moderate. No aortic stenosis is present.  5. The inferior vena cava is normal in size with greater than 50% respiratory variability, suggesting right atrial pressure of 3 mmHg.  6. Agitated saline contrast bubble study was negative, with no evidence of any interatrial shunt. Conclusion(s)/Recommendation(s): AV  leaflets appear thickened with eccentric posterior directed moderate AI. Given presentation with acute CVA, recommend TEE for further evaluation. FINDINGS  Left Ventricle: Left ventricular ejection fraction, by estimation, is 60 to 65%. The left ventricle has normal function. The left ventricle has no regional wall motion abnormalities. The left ventricular internal cavity size was normal in size. There is  mild left ventricular hypertrophy. Left ventricular diastolic parameters were normal. Right Ventricle: The right ventricular size is normal. No increase in right ventricular wall thickness. Right ventricular systolic function is normal. Left Atrium: Left atrial size was normal in size. Right Atrium: Right atrial size was normal in size. Pericardium: There is no evidence of pericardial effusion. Mitral Valve: The mitral valve is normal in structure. Trivial mitral valve regurgitation. Tricuspid Valve: The tricuspid valve is normal in structure. Tricuspid valve regurgitation is not demonstrated. Aortic Valve: The aortic valve was not well visualized. Aortic valve regurgitation is moderate. No aortic stenosis is present. Aortic valve mean gradient measures 2.7 mmHg. Aortic valve peak gradient measures 4.8 mmHg. Aortic valve area, by VTI measures 3.05 cm. Pulmonic Valve: The pulmonic valve was not well visualized. Pulmonic valve regurgitation is trivial. Aorta: The aortic root and ascending aorta are structurally normal, with no evidence of dilitation. Venous: The inferior vena cava is normal in size with greater than 50% respiratory variability, suggesting right atrial pressure of 3 mmHg. IAS/Shunts: No atrial level shunt detected by color flow Doppler. Agitated saline contrast was given intravenously to evaluate for intracardiac shunting. Agitated saline contrast bubble study was negative, with no evidence of any interatrial shunt.  LEFT VENTRICLE PLAX 2D LVIDd:         4.60 cm  Diastology LVIDs:         2.70 cm   LV e' medial:    11.70 cm/s LV PW:         0.90 cm  LV E/e' medial:  7.9 LV IVS:        1.00 cm  LV e' lateral:   17.20 cm/s LVOT diam:     2.30 cm  LV E/e' lateral: 5.4 LV SV:         83 LV SV Index:   50 LVOT Area:     4.15 cm  RIGHT VENTRICLE            IVC RV Basal diam:  3.20 cm    IVC diam: 1.30 cm RV S prime:     9.65 cm/s TAPSE (M-mode): 2.2 cm LEFT ATRIUM             Index       RIGHT ATRIUM           Index LA diam:        3.10 cm 1.85 cm/m  RA Area:     11.05 cm LA Vol (A2C):   46.9 ml 28.00 ml/m RA Volume:   23.80 ml  14.21 ml/m LA Vol (  A4C):   56.2 ml 33.55 ml/m LA Biplane Vol: 53.0 ml 31.64 ml/m  AORTIC VALVE AV Area (Vmax):    3.57 cm AV Area (Vmean):   3.20 cm AV Area (VTI):     3.05 cm AV Vmax:           109.67 cm/s AV Vmean:          73.133 cm/s AV VTI:            0.272 m AV Peak Grad:      4.8 mmHg AV Mean Grad:      2.7 mmHg LVOT Vmax:         94.10 cm/s LVOT Vmean:        56.300 cm/s LVOT VTI:          0.200 m LVOT/AV VTI ratio: 0.74  AORTA Ao Root diam: 2.70 cm Ao Asc diam:  2.40 cm MITRAL VALVE MV Area (PHT): 4.06 cm    SHUNTS MV Decel Time: 187 msec    Systemic VTI:  0.20 m MV E velocity: 92.40 cm/s  Systemic Diam: 2.30 cm MV A velocity: 53.10 cm/s MV E/A ratio:  1.74 Oswaldo Milian MD Electronically signed by Oswaldo Milian MD Signature Date/Time: 04/13/2021/10:43:59 AM    Final    VAS [place] LOWER EXTREMITY VENOUS (DVT)  Result Date: 04/13/2021  Lower Venous DVT Study Patient Name:  SURAH PELLEY  Date of Exam:   04/13/2021 Medical Rec #: [id number]   Accession #:    [id number] Date of Birth: 01/26/2001   Patient Gender: F Patient Age:   39Y Exam Location:  [facility] Procedure:      VAS [place] LOWER EXTREMITY VENOUS (DVT) Referring Phys: [id number] Rosalin Hawking --------------------------------------------------------------------------------  Indications: Stroke.  Limitations: Rt groin bandages. Comparison Study: no prior Performing Technologist: Abram Sander RVS   Examination Guidelines: A complete evaluation includes B-mode imaging, spectral Doppler, color Doppler, and power Doppler as needed of all accessible portions of each vessel. Bilateral testing is considered an integral part of a complete examination. Limited examinations for reoccurring indications may be performed as noted. The reflux portion of the exam is performed with the patient in reverse Trendelenburg.  +---------+---------------+---------+-----------+----------+-------------------+ RIGHT    CompressibilityPhasicitySpontaneityPropertiesThrombus Aging      +---------+---------------+---------+-----------+----------+-------------------+ CFV                                                   Not well visualized +---------+---------------+---------+-----------+----------+-------------------+ SFJ                                                   Not well visualized +---------+---------------+---------+-----------+----------+-------------------+ FV Prox  Full                                                             +---------+---------------+---------+-----------+----------+-------------------+ FV Mid   Full                                                             +---------+---------------+---------+-----------+----------+-------------------+  FV DistalFull                                                             +---------+---------------+---------+-----------+----------+-------------------+ PFV      Full                                                             +---------+---------------+---------+-----------+----------+-------------------+ POP      Full           Yes      Yes                                      +---------+---------------+---------+-----------+----------+-------------------+ PTV      Full                                                              +---------+---------------+---------+-----------+----------+-------------------+ PERO     Full                                                             +---------+---------------+---------+-----------+----------+-------------------+   +---------+---------------+---------+-----------+----------+--------------+ LEFT     CompressibilityPhasicitySpontaneityPropertiesThrombus Aging +---------+---------------+---------+-----------+----------+--------------+ CFV      Full           Yes      Yes                                 +---------+---------------+---------+-----------+----------+--------------+ SFJ      Full                                                        +---------+---------------+---------+-----------+----------+--------------+ FV Prox  Full                                                        +---------+---------------+---------+-----------+----------+--------------+ FV Mid   Full                                                        +---------+---------------+---------+-----------+----------+--------------+ FV DistalFull                                                        +---------+---------------+---------+-----------+----------+--------------+  PFV      Full                                                        +---------+---------------+---------+-----------+----------+--------------+ POP      Full           Yes      Yes                                 +---------+---------------+---------+-----------+----------+--------------+ PTV      Full                                                        +---------+---------------+---------+-----------+----------+--------------+ PERO     Full                                                        +---------+---------------+---------+-----------+----------+--------------+     Summary: BILATERAL: - No evidence of deep vein thrombosis seen in the lower extremities, bilaterally.  -No evidence of popliteal cyst, bilaterally.   *See table(s) above for measurements and observations. Electronically signed by Deitra Mayo MD on 04/13/2021 at 4:14:59 PM.    Final    IR ANGIO INTRA EXTRACRAN SEL INTERNAL CAROTID BILAT MOD SED  Result Date: 04/13/2021 INDICATION: Taiyana Kissler is a 20 year old female with a past medical history significant for asthma and anxiety. She presented to outside hospital on 04/11/2021 with speech difficulty. She was transferred and admitted to [facility] for stroke work-up. 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 A diagnostic cerebral angiogram was requested to evaluate cerebral vasculature. EXAM: ULTRASOUND-GUIDED VASCULAR ACCESS DIAGNOSTIC CEREBRAL ANGIOGRAM COMPARISON:  MR angiogram Apr 12, 2021 MEDICATIONS: None. ANESTHESIA/SEDATION: Versed 1 mg IV; Fentanyl 50 mcg IV Moderate Sedation Time:  1 hour and 16 minutes The patient was continuously monitored during the procedure by the interventional radiology nurse under my direct supervision. CONTRAST:  75 mL of Omnipaque 240 milligrams/mL FLUOROSCOPY TIME:  Fluoroscopy Time: 13 minutes 36 seconds (281.6 mGy). COMPLICATIONS: None immediate. TECHNIQUE: Informed written consent was obtained from the patient and her mother after a thorough discussion of the procedural risks, benefits and alternatives. All questions were addressed. Maximal Sterile Barrier Technique was utilized including caps, mask, sterile gowns, sterile gloves, sterile drape, hand hygiene and skin antiseptic. A timeout was performed prior to the initiation of the procedure. Using the modified Seldinger technique and a micropuncture kit, access was gained to the distal right radial artery at the anatomical snuffbox and a 5 French sheath was placed. Real-time ultrasound guidance was utilized for vascular access including the acquisition of a permanent ultrasound image  documenting patency of the accessed vessel. Slow intra arterial infusion of 5,000 IU heparin, 5 mg Verapamil and 970 mcg nitroglicerin diluted in patient's own blood was performed. No significant fluctuation in patient's blood pressure seen. Then, a right radial artery roadmap was obtained  via sheath side port. Normal brachial artery branching pattern seen. No significant anatomical variation. The right radial artery caliber is adequate for vascular access. Next, a 5 [place] Simmons 2 glide catheter was navigated over a 0.035" Terumo Glidewire into the right subclavian artery under fluoroscopic guidance. Frontal right subclavian artery angiogram was obtained. Due to inability to access the aortic arch from the aberrant right subclavian artery, decision was made to proceed with femoral access. The right groin was prepped and draped in the usual sterile fashion. Using a micropuncture kit and the modified Seldinger technique, access was gained to the right common femoral artery and a 5 French sheath was placed. Under fluoroscopy, a 5 [place] Berenstein 2 catheter was navigated over a 0.035" Terumo Glidewire into the aortic arch. The catheter was placed into the right common carotid artery. Frontal and lateral angiograms of the neck were obtained. Under biplane roadmap, the catheter was advanced into the right internal carotid artery. Frontal and lateral angiograms of the head were obtained followed by right anterior oblique and magnified lateral views of the head. The catheter was then retracted into the right common carotid artery and under fluoroscopy advanced into the right external carotid artery. Frontal and lateral angiograms of the head were obtained. The catheter was then placed into the left subclavian artery and advanced into the left vertebral artery. Frontal, lateral waters and magnified lateral views of the head were obtained. The catheter was then advanced into the left common carotid artery. Frontal and  lateral views of the neck were obtained. Under biplane roadmap, the catheter was advanced into the left internal carotid artery. Frontal, lateral, magnified left anterior oblique and magnified lateral views of the head were obtained. The catheter was then retracted and advanced into the left external carotid artery. Frontal and lateral angiograms of the head were obtained. The catheter was subsequently advanced into the right vertebral artery. Frontal and lateral views of the head were obtained. The catheter was subsequently withdrawn. A 5 French Perclose was utilized for right femoral access closure. Hemostasis was achieved after approximately 5 minutes of manual pressure hold. An inflatable band was placed and inflated over the right hand access site. The vascular sheath was withdrawn and the band was slowly deflated until brisk flow was noted through the arteriotomy site. At this point, the band was reinflated with additional 2 cc of air to obtain patent hemostasis. FINDINGS: Right radial artery ultrasound and right radial artery angiogram: The caliber of the distal right radial artery is appropriate for angiogram access. The right radial artery and the right ulnar artery have normal course and caliber. No significant anatomical variants noted. Right subclavian angiograms: Aberrant right subclavian artery is noted. No opacification of the right vertebral artery. Otherwise, the visualized right subclavian artery and visualized branches of the thyrocervical trunk are unremarkable. Right common femoral artery ultrasound: Normal course and caliber of the right common femoral artery. Right CCA angiograms: Cervical angiograms show normal course and caliber of the visualized right common carotid and internal carotid arteries. There are no significant stenoses. Right ICA angiograms: There is brisk vascular contrast filling of the ACA and MCA vascular trees. Luminal caliber is smooth and tapering. No aneurysms or  abnormally high-flow, early draining veins are seen. No regions of abnormal hypervascularity are noted. The visualized dural sinuses are patent. Right ECA and right occipital angiograms: No early venous drainage was noted. The intracranial branches of the right external carotid artery are unremarkable. Left vertebral artery angiograms: The left vertebral  artery, basilar artery, and bilateral posterior cerebral arteries are unremarkable. Small infundibulum at the origin of a thalami perforator branch from the left P1/PCA is incidentally noted. Luminal caliber is smooth and tapering. No aneurysms or abnormally high-flow, early draining veins are seen. No regions of abnormal hypervascularity are noted. The visualized dural sinuses are patent. Left CCA angiograms: Cervical angiograms show normal course and caliber of the visualized left common carotid and internal carotid arteries. There are no significant stenoses. Left ICA angiograms: There is brisk vascular contrast filling of the the ACA and MCA vascular trees. Luminal caliber is smooth and tapering. No aneurysms or abnormally high-flow, early draining veins are seen. No regions of abnormal hypervascularity are noted. The visualized dural sinuses are patent. Left ECA angiograms: No early venous drainage was noted. The intracranial branches of the left external carotid artery are unremarkable. Right vertebral artery angiograms: The right vertebral artery originates from the right common carotid artery. The right vertebral artery, basilar artery, and bilateral posterior cerebral arteries are unremarkable. Luminal caliber is smooth and tapering. No aneurysms or abnormally high-flow, early draining veins are seen. No regions of abnormal hypervascularity are noted. The visualized dural sinuses are patent. PROCEDURE: Not applicable. IMPRESSION: No evidence of luminal irregularity, hemodynamically significant stenosis, occlusion or other significant vascular abnormality to  explain patient's recent cerebral infarcts. No aneurysm, AVM or dural AV fistula. 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 PLAN: Results communicated to Dr. Erlinda Hong via secure text paging immediately after the angiogram was finalized. Electronically Signed   By: Pedro Earls M.D.   On: 04/13/2021 15:31   IR ANGIO VERTEBRAL SEL SUBCLAVIAN INNOMINATE BILAT MOD SED  Result Date: 04/13/2021 INDICATION: Bryton Waight is a 20 year old female with a past medical history significant for asthma and anxiety. She presented to outside hospital on 04/11/2021 with speech difficulty. She was transferred and admitted to [facility] for stroke work-up. 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 A diagnostic cerebral angiogram was requested to evaluate cerebral vasculature. EXAM: ULTRASOUND-GUIDED VASCULAR ACCESS DIAGNOSTIC CEREBRAL ANGIOGRAM COMPARISON:  MR angiogram Apr 12, 2021 MEDICATIONS: None. ANESTHESIA/SEDATION: Versed 1 mg IV; Fentanyl 50 mcg IV Moderate Sedation Time:  1 hour and 16 minutes The patient was continuously monitored during the procedure by the interventional radiology nurse under my direct supervision. CONTRAST:  75 mL of Omnipaque 240 milligrams/mL FLUOROSCOPY TIME:  Fluoroscopy Time: 13 minutes 36 seconds (281.6 mGy). COMPLICATIONS: None immediate. TECHNIQUE: Informed written consent was obtained from the patient and her mother after a thorough discussion of the procedural risks, benefits and alternatives. All questions were addressed. Maximal Sterile Barrier Technique was utilized including caps, mask, sterile gowns, sterile gloves, sterile drape, hand hygiene and skin antiseptic. A timeout was performed prior to the initiation of the procedure. Using the modified Seldinger technique and a micropuncture kit, access was gained  to the distal right radial artery at the anatomical snuffbox and a 5 French sheath was placed. Real-time ultrasound guidance was utilized for vascular access including the acquisition of a permanent ultrasound image documenting patency of the accessed vessel. Slow intra arterial infusion of 5,000 IU heparin, 5 mg Verapamil and 751 mcg nitroglicerin diluted in patient's own blood was performed. No significant fluctuation in patient's blood pressure seen. Then, a right radial artery roadmap was obtained via sheath side port. Normal brachial artery branching pattern seen.  No significant anatomical variation. The right radial artery caliber is adequate for vascular access. Next, a 5 [place] Simmons 2 glide catheter was navigated over a 0.035" Terumo Glidewire into the right subclavian artery under fluoroscopic guidance. Frontal right subclavian artery angiogram was obtained. Due to inability to access the aortic arch from the aberrant right subclavian artery, decision was made to proceed with femoral access. The right groin was prepped and draped in the usual sterile fashion. Using a micropuncture kit and the modified Seldinger technique, access was gained to the right common femoral artery and a 5 French sheath was placed. Under fluoroscopy, a 5 [place] Berenstein 2 catheter was navigated over a 0.035" Terumo Glidewire into the aortic arch. The catheter was placed into the right common carotid artery. Frontal and lateral angiograms of the neck were obtained. Under biplane roadmap, the catheter was advanced into the right internal carotid artery. Frontal and lateral angiograms of the head were obtained followed by right anterior oblique and magnified lateral views of the head. The catheter was then retracted into the right common carotid artery and under fluoroscopy advanced into the right external carotid artery. Frontal and lateral angiograms of the head were obtained. The catheter was then placed into the left  subclavian artery and advanced into the left vertebral artery. Frontal, lateral waters and magnified lateral views of the head were obtained. The catheter was then advanced into the left common carotid artery. Frontal and lateral views of the neck were obtained. Under biplane roadmap, the catheter was advanced into the left internal carotid artery. Frontal, lateral, magnified left anterior oblique and magnified lateral views of the head were obtained. The catheter was then retracted and advanced into the left external carotid artery. Frontal and lateral angiograms of the head were obtained. The catheter was subsequently advanced into the right vertebral artery. Frontal and lateral views of the head were obtained. The catheter was subsequently withdrawn. A 5 French Perclose was utilized for right femoral access closure. Hemostasis was achieved after approximately 5 minutes of manual pressure hold. An inflatable band was placed and inflated over the right hand access site. The vascular sheath was withdrawn and the band was slowly deflated until brisk flow was noted through the arteriotomy site. At this point, the band was reinflated with additional 2 cc of air to obtain patent hemostasis. FINDINGS: Right radial artery ultrasound and right radial artery angiogram: The caliber of the distal right radial artery is appropriate for angiogram access. The right radial artery and the right ulnar artery have normal course and caliber. No significant anatomical variants noted. Right subclavian angiograms: Aberrant right subclavian artery is noted. No opacification of the right vertebral artery. Otherwise, the visualized right subclavian artery and visualized branches of the thyrocervical trunk are unremarkable. Right common femoral artery ultrasound: Normal course and caliber of the right common femoral artery. Right CCA angiograms: Cervical angiograms show normal course and caliber of the visualized right common carotid and  internal carotid arteries. There are no significant stenoses. Right ICA angiograms: There is brisk vascular contrast filling of the ACA and MCA vascular trees. Luminal caliber is smooth and tapering. No aneurysms or abnormally high-flow, early draining veins are seen. No regions of abnormal hypervascularity are noted. The visualized dural sinuses are patent. Right ECA and right occipital angiograms: No early venous drainage was noted. The intracranial branches of the right external carotid artery are unremarkable. Left vertebral artery angiograms: The left vertebral artery, basilar artery, and bilateral posterior cerebral arteries are unremarkable.  Small infundibulum at the origin of a thalami perforator branch from the left P1/PCA is incidentally noted. Luminal caliber is smooth and tapering. No aneurysms or abnormally high-flow, early draining veins are seen. No regions of abnormal hypervascularity are noted. The visualized dural sinuses are patent. Left CCA angiograms: Cervical angiograms show normal course and caliber of the visualized left common carotid and internal carotid arteries. There are no significant stenoses. Left ICA angiograms: There is brisk vascular contrast filling of the the ACA and MCA vascular trees. Luminal caliber is smooth and tapering. No aneurysms or abnormally high-flow, early draining veins are seen. No regions of abnormal hypervascularity are noted. The visualized dural sinuses are patent. Left ECA angiograms: No early venous drainage was noted. The intracranial branches of the left external carotid artery are unremarkable. Right vertebral artery angiograms: The right vertebral artery originates from the right common carotid artery. The right vertebral artery, basilar artery, and bilateral posterior cerebral arteries are unremarkable. Luminal caliber is smooth and tapering. No aneurysms or abnormally high-flow, early draining veins are seen. No regions of abnormal hypervascularity are  noted. The visualized dural sinuses are patent. PROCEDURE: Not applicable. IMPRESSION: No evidence of luminal irregularity, hemodynamically significant stenosis, occlusion or other significant vascular abnormality to explain patient's recent cerebral infarcts. No aneurysm, AVM or dural AV fistula. 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 PLAN: Results communicated to Dr. Erlinda Hong via secure text paging immediately after the angiogram was finalized. Electronically Signed   By: Pedro Earls M.D.   On: 04/13/2021 15:31   IR ANGIO EXTERNAL CAROTID SEL EXT CAROTID BILAT MOD SED  Result Date: 04/13/2021 INDICATION: Lasya Vetter is a 20 year old female with a past medical history significant for asthma and anxiety. She presented to outside hospital on 04/11/2021 with speech difficulty. She was transferred and admitted to [facility] for stroke work-up. 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 A diagnostic cerebral angiogram was requested to evaluate cerebral vasculature. EXAM: ULTRASOUND-GUIDED VASCULAR ACCESS DIAGNOSTIC CEREBRAL ANGIOGRAM COMPARISON:  MR angiogram Apr 12, 2021 MEDICATIONS: None. ANESTHESIA/SEDATION: Versed 1 mg IV; Fentanyl 50 mcg IV Moderate Sedation Time:  1 hour and 16 minutes The patient was continuously monitored during the procedure by the interventional radiology nurse under my direct supervision. CONTRAST:  75 mL of Omnipaque 240 milligrams/mL FLUOROSCOPY TIME:  Fluoroscopy Time: 13 minutes 36 seconds (281.6 mGy). COMPLICATIONS: None immediate. TECHNIQUE: Informed written consent was obtained from the patient and her mother after a thorough discussion of the procedural risks, benefits and alternatives. All questions were addressed. Maximal Sterile Barrier Technique was utilized including caps, mask, sterile  gowns, sterile gloves, sterile drape, hand hygiene and skin antiseptic. A timeout was performed prior to the initiation of the procedure. Using the modified Seldinger technique and a micropuncture kit, access was gained to the distal right radial artery at the anatomical snuffbox and a 5 French sheath was placed. Real-time ultrasound guidance was utilized for vascular access including the acquisition of a permanent ultrasound image documenting patency of the accessed vessel. Slow intra arterial infusion of 5,000 IU heparin, 5 mg Verapamil and 466 mcg nitroglicerin diluted in patient's own blood was performed. No significant fluctuation in patient's blood pressure seen. Then, a right radial artery roadmap was obtained via sheath side port. Normal brachial artery branching pattern seen. No significant anatomical variation. The right radial artery caliber  is adequate for vascular access. Next, a 5 [place] Simmons 2 glide catheter was navigated over a 0.035" Terumo Glidewire into the right subclavian artery under fluoroscopic guidance. Frontal right subclavian artery angiogram was obtained. Due to inability to access the aortic arch from the aberrant right subclavian artery, decision was made to proceed with femoral access. The right groin was prepped and draped in the usual sterile fashion. Using a micropuncture kit and the modified Seldinger technique, access was gained to the right common femoral artery and a 5 French sheath was placed. Under fluoroscopy, a 5 [place] Berenstein 2 catheter was navigated over a 0.035" Terumo Glidewire into the aortic arch. The catheter was placed into the right common carotid artery. Frontal and lateral angiograms of the neck were obtained. Under biplane roadmap, the catheter was advanced into the right internal carotid artery. Frontal and lateral angiograms of the head were obtained followed by right anterior oblique and magnified lateral views of the head. The catheter was then  retracted into the right common carotid artery and under fluoroscopy advanced into the right external carotid artery. Frontal and lateral angiograms of the head were obtained. The catheter was then placed into the left subclavian artery and advanced into the left vertebral artery. Frontal, lateral waters and magnified lateral views of the head were obtained. The catheter was then advanced into the left common carotid artery. Frontal and lateral views of the neck were obtained. Under biplane roadmap, the catheter was advanced into the left internal carotid artery. Frontal, lateral, magnified left anterior oblique and magnified lateral views of the head were obtained. The catheter was then retracted and advanced into the left external carotid artery. Frontal and lateral angiograms of the head were obtained. The catheter was subsequently advanced into the right vertebral artery. Frontal and lateral views of the head were obtained. The catheter was subsequently withdrawn. A 5 French Perclose was utilized for right femoral access closure. Hemostasis was achieved after approximately 5 minutes of manual pressure hold. An inflatable band was placed and inflated over the right hand access site. The vascular sheath was withdrawn and the band was slowly deflated until brisk flow was noted through the arteriotomy site. At this point, the band was reinflated with additional 2 cc of air to obtain patent hemostasis. FINDINGS: Right radial artery ultrasound and right radial artery angiogram: The caliber of the distal right radial artery is appropriate for angiogram access. The right radial artery and the right ulnar artery have normal course and caliber. No significant anatomical variants noted. Right subclavian angiograms: Aberrant right subclavian artery is noted. No opacification of the right vertebral artery. Otherwise, the visualized right subclavian artery and visualized branches of the thyrocervical trunk are unremarkable.  Right common femoral artery ultrasound: Normal course and caliber of the right common femoral artery. Right CCA angiograms: Cervical angiograms show normal course and caliber of the visualized right common carotid and internal carotid arteries. There are no significant stenoses. Right ICA angiograms: There is brisk vascular contrast filling of the ACA and MCA vascular trees. Luminal caliber is smooth and tapering. No aneurysms or abnormally high-flow, early draining veins are seen. No regions of abnormal hypervascularity are noted. The visualized dural sinuses are patent. Right ECA and right occipital angiograms: No early venous drainage was noted. The intracranial branches of the right external carotid artery are unremarkable. Left vertebral artery angiograms: The left vertebral artery, basilar artery, and bilateral posterior cerebral arteries are unremarkable. Small infundibulum at the origin of a thalami perforator  branch from the left P1/PCA is incidentally noted. Luminal caliber is smooth and tapering. No aneurysms or abnormally high-flow, early draining veins are seen. No regions of abnormal hypervascularity are noted. The visualized dural sinuses are patent. Left CCA angiograms: Cervical angiograms show normal course and caliber of the visualized left common carotid and internal carotid arteries. There are no significant stenoses. Left ICA angiograms: There is brisk vascular contrast filling of the the ACA and MCA vascular trees. Luminal caliber is smooth and tapering. No aneurysms or abnormally high-flow, early draining veins are seen. No regions of abnormal hypervascularity are noted. The visualized dural sinuses are patent. Left ECA angiograms: No early venous drainage was noted. The intracranial branches of the left external carotid artery are unremarkable. Right vertebral artery angiograms: The right vertebral artery originates from the right common carotid artery. The right vertebral artery, basilar  artery, and bilateral posterior cerebral arteries are unremarkable. Luminal caliber is smooth and tapering. No aneurysms or abnormally high-flow, early draining veins are seen. No regions of abnormal hypervascularity are noted. The visualized dural sinuses are patent. PROCEDURE: Not applicable. IMPRESSION: No evidence of luminal irregularity, hemodynamically significant stenosis, occlusion or other significant vascular abnormality to explain patient's recent cerebral infarcts. No aneurysm, AVM or dural AV fistula. 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 PLAN: Results communicated to Dr. Erlinda Hong via secure text paging immediately after the angiogram was finalized. Electronically Signed   By: Pedro Earls M.D.   On: 04/13/2021 15:31    PHYSICAL EXAM  Temp:  [98 F (36.7 C)-98.6 F (37 C)] 98 F (36.7 C) (05/31 0356) Pulse Rate:  [49-98] 62 (05/31 1537) Resp:  [10-26] 18 (05/31 1205) BP: (114-155)/(75-102) 135/86 (05/31 1537) SpO2:  [98 %-100 %] 100 % (05/31 1537)  General - Well nourished, well developed, in no apparent distress.  Ophthalmologic - fundi not visualized due to noncooperation.  Cardiovascular - Regular rhythm and rate.  Mental Status -  Level of arousal and orientation to time, place, and person were intact. Language including naming, comprehension was assessed and found intact.  However, patient has difficulty repeating complex sentences, intermittent word finding difficulty. Fund of Knowledge was assessed and was intact.  Cranial Nerves II - XII - II - Visual field intact OU. III, IV, VI - Extraocular movements intact. V - Facial sensation intact bilaterally. VII - Facial movement intact bilaterally. VIII - Hearing & vestibular intact bilaterally. X - Palate elevates symmetrically. XI - Chin turning & shoulder shrug intact bilaterally. XII - Tongue protrusion  intact.  Motor Strength - The patient's strength was normal in all extremities and pronator drift was absent.  Bulk was normal and fasciculations were absent.   Motor Tone - Muscle tone was assessed at the neck and appendages and was normal.  Reflexes - The patient's reflexes were symmetrical in all extremities and she had no pathological reflexes.  Sensory - Light touch, temperature/pinprick were assessed and were symmetrical.    Coordination - The patient had normal movements in the hands and feet with no ataxia or dysmetria.  Tremor was absent.  Gait and Station - deferred.   ASSESSMENT/PLAN Ms. Kasmira Cacioppo is a 20 y.o. female with history of OCP use, intermittent smoker and THC user admitted for expressive aphasia and headache. No tPA given due to outside window.    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 RCVS   Resultant mild expressive  aphasia  CT no acute abnormality  MRI left MCA moderate sized infarct, right PCA, right cerebellum and right frontal punctate infarcts  MRA subtle diffuse small vessel irregularity in the intracranial circulation  Cerebral angiogram unremarkable, not consistent with vasculitis  CSF unremarkable, not consistent with vasculitis  2D Echo EF 60 to 65%, no PFO seen  LE venous Doppler negative for DVT  TEE pending  LDL 89  HgbA1c pending  Hypercoagulable and autoimmune work-up pending  SCDs for VTE prophylaxis  No antithrombotic prior to admission, now on aspirin 81 mg daily and aspirin 325 mg daily for 3 weeks and then aspirin alone  Patient counseled to be compliant with her antithrombotic medications  Ongoing aggressive stroke risk factor management  Therapy recommendations: None  Disposition: Pending  Hyperlipidemia  Home meds: None  LDL 89, goal < 70  Now on Lipitor 20, no high intensity statin due to childbearing age and relatively LDL close to goal  Continue statin at discharge  OCP  use  Patient on low-dose estrogen for about a year  Recommend patient discuss with OB physician, off OCP if able, if not, consider progesterone-only OCPs  Tobacco abuse  Current intermittent smoker  Smoking cessation counseling provided  Pt is willing to quit  THC use  UDS positive for [facility]  Cessation counseling provided  Patient invading to create  Other Stroke Risk Factors    Other [facility] day # 1  I spent  35 minutes in total face-to-face time with the patient, more than 50% of which was spent in counseling and coordination of care, reviewing test results, images and medication, and discussing the diagnosis, treatment plan and potential prognosis. This patient's care requiresreview of multiple databases, neurological assessment, discussion with family, other specialists and medical decision making of high complexity. I had long discussion with patient parents at bedside, updated pt current condition, treatment plan and potential prognosis, and answered all the questions.  They expressed understanding and appreciation.   Rosalin Hawking, MD PhD Stroke Neurology 04/13/2021 4:41 PM    To contact Stroke Continuity provider, please refer to [email or web address] After hours, contact General Neurology

## 2021-04-13 NOTE — Progress Notes (Signed)
PHARMACY - PHYSICIAN COMMUNICATION CRITICAL VALUE ALERT - BLOOD CULTURE IDENTIFICATION (BCID)  Meghan Williamson is an 20 y.o. female who presented to [facility] on 04/11/2021 with a chief complaint of difficulty speaking, found to have acute CVA.  Assessment:  Gram positive rods on gram stain in 1/4 bottles (anaerobic bottle only) - likely a contaminant  Name of physician (or Provider) Contacted: Dr. Catha Gosselin  Current antibiotics: none  Changes to prescribed antibiotics recommended:  None  No results found for this or any previous visit.  Rexford Maus, PharmD PGY-1 Acute Care Pharmacy Resident 04/13/2021  7:16 AM

## 2021-04-14 ENCOUNTER — Inpatient Hospital Stay (HOSPITAL_COMMUNITY): Payer: BC Managed Care – PPO | Admitting: Certified Registered Nurse Anesthetist

## 2021-04-14 ENCOUNTER — Encounter (HOSPITAL_COMMUNITY): Payer: Self-pay | Admitting: Internal Medicine

## 2021-04-14 ENCOUNTER — Inpatient Hospital Stay (HOSPITAL_COMMUNITY): Payer: BC Managed Care – PPO

## 2021-04-14 ENCOUNTER — Encounter (HOSPITAL_COMMUNITY)
Admission: EM | Disposition: A | Discharge status: Home / Self Care | Payer: Self-pay | Source: Home / Self Care | Attending: Internal Medicine

## 2021-04-14 DIAGNOSIS — I351 Nonrheumatic aortic (valve) insufficiency: Secondary | ICD-10-CM

## 2021-04-14 DIAGNOSIS — I639 Cerebral infarction, unspecified: Secondary | ICD-10-CM

## 2021-04-14 DIAGNOSIS — R413 Other amnesia: Secondary | ICD-10-CM

## 2021-04-14 HISTORY — PX: TEE WITHOUT CARDIOVERSION: SHX5443

## 2021-04-14 HISTORY — PX: BUBBLE STUDY: SHX6837

## 2021-04-14 LAB — ECHO TEE
AV Mean grad: 2 mmHg
AV Peak grad: 4.6 mmHg
Ao pk vel: 1.07 m/s
Area-P 1/2: 3.63 cm2
P 1/2 time: 1010 msec

## 2021-04-14 LAB — MISC LABCORP TEST (SEND OUT): Labcorp test code: 9985

## 2021-04-14 LAB — CARDIOLIPIN ANTIBODIES, IGG, IGM, IGA
Anticardiolipin IgA: <9 APL U/mL (ref 0–11)
Anticardiolipin IgG: <9 GPL U/mL (ref 0–14)
Anticardiolipin IgM: <9 MPL U/mL (ref 0–12)

## 2021-04-14 LAB — HSV 1/2 PCR, CSF
HSV-1 DNA: NEGATIVE
HSV-2 DNA: NEGATIVE

## 2021-04-14 LAB — VDRL, CSF: VDRL Quant, CSF: NONREACTIVE

## 2021-04-14 LAB — PROTEIN C, TOTAL: Protein C, Total: 100 % (ref 60–150)

## 2021-04-14 LAB — MPO/PR-3 (ANCA) ANTIBODIES
ANCA Proteinase 3: <3.5 U/mL (ref 0.0–3.5)
Myeloperoxidase Abs: <9 U/mL (ref 0.0–9.0)

## 2021-04-14 SURGERY — ECHOCARDIOGRAM, TRANSESOPHAGEAL
Anesthesia: Monitor Anesthesia Care

## 2021-04-14 MED ORDER — ATORVASTATIN CALCIUM 10 MG PO TABS: 20.0000 mg | ORAL_TABLET | Freq: Every day | ORAL | 2 refills | Status: AC

## 2021-04-14 MED ORDER — ASPIRIN 81 MG PO TBEC: 81.0000 mg | DELAYED_RELEASE_TABLET | Freq: Every day | ORAL | 11 refills | Status: AC

## 2021-04-14 MED ORDER — SODIUM CHLORIDE 0.9 % IV SOLN: INTRAVENOUS | Status: DC | PRN

## 2021-04-14 MED ORDER — CLOPIDOGREL BISULFATE 75 MG PO TABS: 75.0000 mg | ORAL_TABLET | Freq: Every day | ORAL | 0 refills | Status: DC

## 2021-04-14 MED ORDER — LIDOCAINE 2% (20 MG/ML) 5 ML SYRINGE
INTRAMUSCULAR | Status: DC | PRN
  Administered 2021-04-14: 40 mg via INTRAVENOUS

## 2021-04-14 MED ORDER — DEXMEDETOMIDINE (PRECEDEX) IN NS 20 MCG/5ML (4 MCG/ML) IV SYRINGE
PREFILLED_SYRINGE | INTRAVENOUS | Status: DC | PRN
  Administered 2021-04-14: 8 ug via INTRAVENOUS

## 2021-04-14 MED ORDER — ACETAMINOPHEN 325 MG PO TABS: 325.0000 mg | ORAL_TABLET | Freq: Four times a day (QID) | ORAL | 0 refills | Status: AC | PRN

## 2021-04-14 MED ORDER — PROPOFOL 500 MG/50ML IV EMUL
INTRAVENOUS | Status: DC | PRN
  Administered 2021-04-14: 150 ug/kg/min via INTRAVENOUS

## 2021-04-14 NOTE — Procedures (Signed)
     Transesophageal Echocardiogram Note  Meghan Williamson [id number] 2001-03-26  Procedure: Transesophageal Echocardiogram Indications: Stroke  Procedure Details Consent: Obtained Time Out: Verified patient identification, verified procedure, site/side was marked, verified correct patient position, special equipment/implants available, Radiology Safety Procedures followed,  medications/allergies/relevent history reviewed, required imaging and test results available.  Performed  Medications: Precedex: 12mg  Propofol: 250mg   Left Ventrical:  Normal LVEF 60-65%  Mitral Valve: Normal structure. No MR  Aortic Valve: Tricuspid with normal leaflet morphology. There is mild vs mild-to-moderate AR which appears to be secondary to lack of central coaptation  Tricuspid Valve: Normal structure. Trivial TR  Pulmonic Valve: Normal structure. Trivial PI  Left Atrium/ Left atrial appendage: Normal LA size. No LAA thrombus  Atrial septum: No PFO by color doppler interrogation or agitated saline study at rest or with valsalva  Aorta: Normal root and ascending aorta   Complications: No apparent complications Patient did tolerate procedure well.  , MD 04/14/2021, 1:59 PM

## 2021-04-14 NOTE — Progress Notes (Signed)
  Echocardiogram Echocardiogram Transesophageal has been performed.  Gerda Diss 04/14/2021, 3:05 PM

## 2021-04-14 NOTE — Transfer of Care (Signed)
Immediate Anesthesia Transfer of Care Note  Patient: Meghan Williamson  Procedure(s) Performed: TRANSESOPHAGEAL ECHOCARDIOGRAM (TEE) (N/A ) BUBBLE STUDY  Patient Location: Endoscopy Unit  Anesthesia Type:MAC  Level of Consciousness: drowsy  Airway & Oxygen Therapy: Patient Spontanous Breathing and Patient connected to nasal cannula oxygen  Post-op Assessment: Report given to RN and Post -op Vital signs reviewed and stable  Post vital signs: Reviewed and stable  Last Vitals:  Vitals Value Taken Time  BP    Temp    Pulse    Resp    SpO2      Last Pain:  Vitals:   04/14/21 1305  TempSrc:   PainSc: 0-No pain      Patients Stated Pain Goal: 0 (93/[id number])  Complications: No complications documented.

## 2021-04-14 NOTE — Anesthesia Preprocedure Evaluation (Addendum)
Anesthesia Evaluation  Patient identified by MRN, date of birth, ID band Patient awake    Reviewed: Allergy & Precautions, NPO status , Patient's Chart, lab work & pertinent test results  History of Anesthesia Complications Negative for: history of anesthetic complications  Airway Mallampati: II  TM Distance: >3 FB Neck ROM: Full    Dental no notable dental hx.    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, memory problems)), Residual Symptoms    GI/Hepatic negative GI ROS, (+)     substance abuse  ,   Endo/Other  negative endocrine ROS  Renal/GU negative Renal ROS     Musculoskeletal negative musculoskeletal ROS (+)   Abdominal   Peds  Hematology negative hematology ROS (+)   Anesthesia Other Findings STROKE  Reproductive/Obstetrics hcg negative                           Anesthesia Physical Anesthesia Plan  ASA: II  Anesthesia Plan: MAC   Post-op Pain Management:    Induction: Intravenous  PONV Risk Score and Plan: 1 and Treatment may vary due to age or medical condition and Propofol infusion  Airway Management Planned: Nasal Cannula  Additional Equipment:   Intra-op Plan:   Post-operative Plan:   Informed Consent: I have reviewed the patients History and Physical, chart, labs and discussed the procedure including the risks, benefits and alternatives for the proposed anesthesia with the patient or authorized representative who has indicated his/her understanding and acceptance.     Dental advisory given  Plan Discussed with: CRNA  Anesthesia Plan Comments:        Anesthesia Quick Evaluation

## 2021-04-14 NOTE — TOC CAGE-AID Note (Signed)
Transition of Care [facility]) - CAGE-AID Screening   Patient Details  Name: Janyra Barillas MRN: [id number] Date of Birth: 04-04-2001  [facility] CM/SW Contact:    Kermit Balo, RN Phone Number: 04/14/2021, 3:34 PM   Clinical Narrative: Counseling information provided in d/c packet.   CAGE-AID Screening:    Have You Ever Felt You Ought to Cut Down on Your Drinking or Drug Use?: No Have People Annoyed You By Critizing Your Drinking Or Drug Use?: No Have You Felt Bad Or Guilty About Your Drinking Or Drug Use?: No Have You Ever Had a Drink or Used Drugs First Thing In The Morning to Steady Your Nerves or to Get Rid of a Hangover?: No CAGE-AID Score: 0  Substance Abuse Education Offered: Yes  Substance abuse interventions: Patient Counseling

## 2021-04-14 NOTE — Progress Notes (Signed)
PROGRESS NOTE    Meghan Williamson  MAY:[id number] DOB: 2001-10-06 DOA: 04/11/2021 PCP: Pcp, No   Brief Narrative:   Meghan Williamson is a 20 y.o. female with medical history of intermittent seasonal allergies inducing asthma but no current symptoms presented to the hospital with difficulty speech and memory while she was driving home.  She initially developed some headache that was throbbing without any aura.  She then started having difficulty speaking and finding words and was typing jumbled letters. She also reported some memory lapse and difficulty remembering things so she presented to the hospital.  Patient is currently on oral contraceptives to regulate her menstrual periods and occasionally smokes marijuana.  She never had any similar symptoms in the past.  MRI did show CVA.  Patient was then admitted hospital for further evaluation and treatment.    Assessment & Plan   Acute multifocal CVA CT head scan was unremarkable.  MRI of the brain showed acute ischemic infarct involving the left temporal occipital and parietal regions with possible minimal petechial hemorrhage.  Also involved was right frontal lobe, right basal ganglia and right temporal, occipital and cerebellar region.  Some remote lacunar infarcts were noted on the left thalamus and cerebellum as well. MRA head: Question subtle diffuse small vessel irregularity about the intracranial circulation, considerations for early atherosclerotic disease versus changes in vasculopathy.  Otherwise normal MRA, no large vessel occlusion.  MRA of the neck was unremarkable.  LDL was 89. MRA neck was unremarkable.  Hemoglobin A1c still pending. 2D echocardiogram showed LV ejection fraction of 60 to 65% with no regional wall motion abnormality.  TEE was advised and patient underwent TEE on 04/14/2021 with normal valvular abnormalities to suggest embolic phenomena.   Bubble study unremarkable, no evidence of an intra-arterial shunt. EEG was suggestive of cortical  dysfunction left hemisphere, maximal left temporal region likely due to underlying stroke.  No seizures or epileptiform discharges seen.  Patient also had diagnostic cerebral angiogram which did not show any evidence of intracranial occlusion, hemodynamically significant stenosis or aneurysm AVM.  Hypercoagulable work-up has been sent which is pending.  She has been seen by PT OT and speech therapy and does not require any specialized needs.  Patient is also status post LP.  Lower extremity duplex ultrasound was negative for DVT.  Of note patient is on oral contraceptive started 1 year back and had COVID in November 2020.  Urine drug screen was positive for THC as well. Continue aspirin and Lipitor Plavix at this time.  Vitamin B12 deficiency Vitamin B12 was 88.  Patient has been started on vitamin B12 injection 1000 mcg daily for 7 days which will be followed by weekly injections for 1 month followed by monthly supplementation until B12 normalizes  Menstrual irregularities  Patient was prescribed OCPs 1 year ago.  Would avoid OCPs in the context of CVA.  DVT Prophylaxis  SCDs  Code Status: Full  Family Communication: Spoke with the patient's parents at bedside.  Disposition Plan:  Status is: Inpatient  Remains inpatient appropriate because:Ongoing diagnostic testing needed not appropriate for outpatient work up and Inpatient level of care appropriate due to severity of illness   Dispo: The patient is from: Home              Anticipated d/c is to: Home              Patient currently is not medically stable to d/c.   Difficult to place patient No    Consultants  Neurology Interventional radiology  Procedures  EEG  2D echocardiogram Cerebral angiogram Lumbar puncture Transesophageal echocardiogram  Antibiotics   Anti-infectives (From admission, onward)   None      Subjective:   Today, patient was seen and examined at bedside.  Patient denies any dizziness lightheadedness  headache chest pain shortness of breath.  Objective:   Vitals:   04/14/21 1305 04/14/21 1405 04/14/21 1415 04/14/21 1425  BP: (!) 165/91 116/61 (!) 116/58 116/61  Pulse: (!) 58 63 (!) 58 (!) 54  Resp: 12 17 15 15   Temp: 98.1 F (36.7 C) (!) 97 F (36.1 C)    TempSrc:  Temporal    SpO2: 98% 100% 99% 99%  Weight:      Height:       No intake or output data in the 24 hours ending 04/14/21 1522 Filed Weights   04/11/21 1352 04/11/21 2106  Weight: 58.1 kg 59.8 kg   Physical exam  General:  Average built, not in obvious distress HENT:   No scleral pallor or icterus noted. Oral mucosa is moist.  Chest:  Clear breath sounds.  Diminished breath sounds bilaterally. No crackles or wheezes.  CVS: S1 &S2 heard. No murmur.  Regular rate and rhythm. Abdomen: Soft, nontender, nondistended.  Bowel sounds are heard.   Extremities: No cyanosis, clubbing or edema.  Peripheral pulses are palpable. Psych: Alert, awake and oriented, normal mood CNS:  No cranial nerve deficits.  Power equal in all extremities.   Skin: Warm and dry.  No rashes noted.   Data Reviewed: I have personally reviewed following labs and imaging studies  CBC: Recent Labs  Lab 04/11/21 1444  WBC 4.8  NEUTROABS 2.4  HGB 13.0  HCT 38.7  MCV 81.8  PLT 409   Basic Metabolic Panel: Recent Labs  Lab 04/11/21 1444  NA 137  K 4.0  CL 105  CO2 24  GLUCOSE 87  BUN 10  CREATININE 0.79  CALCIUM 9.7   GFR: Estimated Creatinine Clearance: 106.8 mL/min (by C-G formula based on SCr of 0.79 mg/dL). Liver Function Tests: Recent Labs  Lab 04/11/21 1444  AST 12*  ALT 9  ALKPHOS 45  BILITOT 0.4  PROT 7.2  ALBUMIN 4.4   No results for input(s): LIPASE, AMYLASE in the last 168 hours. No results for input(s): AMMONIA in the last 168 hours. Coagulation Profile: Recent Labs  Lab 04/11/21 1444  INR 1.1   Cardiac Enzymes: No results for input(s): CKTOTAL, CKMB, CKMBINDEX, TROPONINI in the last 168 hours. BNP  (last 3 results) No results for input(s): PROBNP in the last 8760 hours. HbA1C: Recent Labs    04/12/21 0147  HGBA1C QNSTST   CBG: Recent Labs  Lab 04/11/21 1421 04/12/21 1513  GLUCAP 89 105*   Lipid Profile: Recent Labs    04/12/21 0147  CHOL 151  HDL 40*  LDLCALC 89  TRIG 110  CHOLHDL 3.8   Thyroid Function Tests: Recent Labs    04/12/21 1405  TSH 1.208   Anemia Panel: Recent Labs    04/13/21 0835  VITAMINB12 88*   Urine analysis:    Component Value Date/Time   COLORURINE COLORLESS (A) 04/11/2021 1444   APPEARANCEUR CLEAR 04/11/2021 1444   LABSPEC 1.009 04/11/2021 1444   PHURINE 7.5 04/11/2021 1444   GLUCOSEU NEGATIVE 04/11/2021 1444   HGBUR MODERATE (A) 04/11/2021 1444   BILIRUBINUR NEGATIVE 04/11/2021 1444   KETONESUR NEGATIVE 04/11/2021 1444   PROTEINUR NEGATIVE 04/11/2021 1444   NITRITE NEGATIVE 04/11/2021 1444  LEUKOCYTESUR NEGATIVE 04/11/2021 1444   Sepsis Labs: @LABRCNTIP (procalcitonin:4,lacticidven:4)  ) Recent Results (from the past 240 hour(s))  Resp Panel by RT-PCR (Flu A&B, Covid) Nasopharyngeal Swab     Status: None   Collection Time: 04/11/21  4:08 PM   Specimen: Nasopharyngeal Swab; Nasopharyngeal(NP) swabs in vial transport medium  Result Value Ref Range Status   SARS Coronavirus 2 by RT PCR NEGATIVE NEGATIVE Final    Comment: (NOTE) SARS-CoV-2 target nucleic acids are NOT DETECTED.  The SARS-CoV-2 RNA is generally detectable in upper respiratory specimens during the acute phase of infection. The lowest concentration of SARS-CoV-2 viral copies this assay can detect is 138 copies/mL. A negative result does not preclude SARS-Cov-2 infection and should not be used as the sole basis for treatment or other patient management decisions. A negative result may occur with  improper specimen collection/handling, submission of specimen other than nasopharyngeal swab, presence of viral mutation(s) within the areas targeted by this assay,  and inadequate number of viral copies(<138 copies/mL). A negative result must be combined with clinical observations, patient history, and epidemiological information. The expected result is Negative.  Fact Sheet for Patients:  EntrepreneurPulse.com.au  Fact Sheet for Healthcare Providers:  IncredibleEmployment.be  This test is no t yet approved or cleared by the [place] FDA and  has been authorized for detection and/or diagnosis of SARS-CoV-2 by FDA under an Emergency Use Authorization (EUA). This EUA will remain  in effect (meaning this test can be used) for the duration of the COVID-19 declaration under Section 564(b)(1) of the Act, 21 U.S.C.section 360bbb-3(b)(1), unless the authorization is terminated  or revoked sooner.       Influenza A by PCR NEGATIVE NEGATIVE Final   Influenza B by PCR NEGATIVE NEGATIVE Final    Comment: (NOTE) The Xpert Xpress SARS-CoV-2/FLU/RSV plus assay is intended as an aid in the diagnosis of influenza from Nasopharyngeal swab specimens and should not be used as a sole basis for treatment. Nasal washings and aspirates are unacceptable for Xpert Xpress SARS-CoV-2/FLU/RSV testing.  Fact Sheet for Patients: EntrepreneurPulse.com.au  Fact Sheet for Healthcare Providers: IncredibleEmployment.be  This test is not yet approved or cleared by the [place] FDA and has been authorized for detection and/or diagnosis of SARS-CoV-2 by FDA under an Emergency Use Authorization (EUA). This EUA will remain in effect (meaning this test can be used) for the duration of the COVID-19 declaration under Section 564(b)(1) of the Act, 21 U.S.C. section 360bbb-3(b)(1), unless the authorization is terminated or revoked.  Performed at [facility], [address] [place], [place] [address]   Culture, blood (routine x 2)     Status: None (Preliminary result)    Collection Time: 04/12/21  8:13 AM   Specimen: BLOOD  Result Value Ref Range Status   Specimen Description BLOOD BLOOD RIGHT HAND  Final   Special Requests   Final    BOTTLES DRAWN AEROBIC AND ANAEROBIC Blood Culture adequate volume   Culture   Final    NO GROWTH 2 DAYS Performed at [facility], [place] [address] [place], [place] [address]    Report Status PENDING  Incomplete  Culture, blood (routine x 2)     Status: Abnormal (Preliminary result)   Collection Time: 04/12/21  8:27 AM   Specimen: BLOOD  Result Value Ref Range Status   Specimen Description BLOOD RIGHT ANTECUBITAL  Final   Special Requests   Final    BOTTLES DRAWN AEROBIC AND ANAEROBIC Blood Culture adequate volume  Culture  Setup Time   Final    GRAM POSITIVE RODS ANAEROBIC BOTTLE ONLY CRITICAL RESULT CALLED TO, READ BACK BY AND VERIFIED WITH: Kittie Plater PHARMD, AT 0710 04/13/21 D. VANHOOK    Culture (A)  Final    BACILLUS SPECIES Standardized susceptibility testing for this organism is not available. Performed at [facility], [place] [address], [place], [place] [address]    Report Status PENDING  Incomplete  CSF culture w Gram Stain     Status: None (Preliminary result)   Collection Time: 04/12/21  3:05 PM   Specimen: CSF; Cerebrospinal Fluid  Result Value Ref Range Status   Specimen Description CSF  Final   Special Requests NONE  Final   Gram Stain NO WBC SEEN NO ORGANISMS SEEN CYTOSPIN SMEAR   Final   Culture   Final    NO GROWTH 2 DAYS Performed at [facility], [place] [address] [place], [place] [address]    Report Status PENDING  Incomplete      Radiology Studies: IR US Guide Vasc Access Right  Result Date: 04/13/2021 INDICATION: Margrett Kalb is a 20 year old female with a past medical history significant for asthma and anxiety. She presented to outside hospital on 04/11/2021 with speech difficulty. She was transferred and admitted to [facility] for stroke work-up. Further imaging revealed acute  multifocal CVAs (largest in the left MCA territory) concerning for embolic event versus vasculitis. A diagnostic cerebral angiogram was requested to evaluate cerebral vasculature. EXAM: ULTRASOUND-GUIDED VASCULAR ACCESS DIAGNOSTIC CEREBRAL ANGIOGRAM COMPARISON:  MR angiogram Apr 12, 2021 MEDICATIONS: None. ANESTHESIA/SEDATION: Versed 1 mg IV; Fentanyl 50 mcg IV Moderate Sedation Time:  1 hour and 16 minutes The patient was continuously monitored during the procedure by the interventional radiology nurse under my direct supervision. CONTRAST:  75 mL of Omnipaque 240 milligrams/mL FLUOROSCOPY TIME:  Fluoroscopy Time: 13 minutes 36 seconds (281.6 mGy). COMPLICATIONS: None immediate. TECHNIQUE: Informed written consent was obtained from the patient and her mother after a thorough discussion of the procedural risks, benefits and alternatives. All questions were addressed. Maximal Sterile Barrier Technique was utilized including caps, mask, sterile gowns, sterile gloves, sterile drape, hand hygiene and skin antiseptic. A timeout was performed prior to the initiation of the procedure. Using the modified Seldinger technique and a micropuncture kit, access was gained to the distal right radial artery at the anatomical snuffbox and a 5 French sheath was placed. Real-time ultrasound guidance was utilized for vascular access including the acquisition of a permanent ultrasound image documenting patency of the accessed vessel. Slow intra arterial infusion of 5,000 IU heparin, 5 mg Verapamil and 941 mcg nitroglicerin diluted in patient's own blood was performed. No significant fluctuation in patient's blood pressure seen. Then, a right radial artery roadmap was obtained via sheath side port. Normal brachial artery branching pattern seen. No significant anatomical variation. The right radial artery caliber is adequate for vascular access. Next, a 5 [place] Simmons 2 glide catheter was navigated over a 0.035" Terumo Glidewire into  the right subclavian artery under fluoroscopic guidance. Frontal right subclavian artery angiogram was obtained. Due to inability to access the aortic arch from the aberrant right subclavian artery, decision was made to proceed with femoral access. The right groin was prepped and draped in the usual sterile fashion. Using a micropuncture kit and the modified Seldinger technique, access was gained to the right common femoral artery and a 5 French sheath was placed. Under fluoroscopy, a 5 [place] Berenstein 2 catheter  was navigated over a 0.035" Terumo Glidewire into the aortic arch. The catheter was placed into the right common carotid artery. Frontal and lateral angiograms of the neck were obtained. Under biplane roadmap, the catheter was advanced into the right internal carotid artery. Frontal and lateral angiograms of the head were obtained followed by right anterior oblique and magnified lateral views of the head. The catheter was then retracted into the right common carotid artery and under fluoroscopy advanced into the right external carotid artery. Frontal and lateral angiograms of the head were obtained. The catheter was then placed into the left subclavian artery and advanced into the left vertebral artery. Frontal, lateral waters and magnified lateral views of the head were obtained. The catheter was then advanced into the left common carotid artery. Frontal and lateral views of the neck were obtained. Under biplane roadmap, the catheter was advanced into the left internal carotid artery. Frontal, lateral, magnified left anterior oblique and magnified lateral views of the head were obtained. The catheter was then retracted and advanced into the left external carotid artery. Frontal and lateral angiograms of the head were obtained. The catheter was subsequently advanced into the right vertebral artery. Frontal and lateral views of the head were obtained. The catheter was subsequently withdrawn. A 5 French  Perclose was utilized for right femoral access closure. Hemostasis was achieved after approximately 5 minutes of manual pressure hold. An inflatable band was placed and inflated over the right hand access site. The vascular sheath was withdrawn and the band was slowly deflated until brisk flow was noted through the arteriotomy site. At this point, the band was reinflated with additional 2 cc of air to obtain patent hemostasis. FINDINGS: Right radial artery ultrasound and right radial artery angiogram: The caliber of the distal right radial artery is appropriate for angiogram access. The right radial artery and the right ulnar artery have normal course and caliber. No significant anatomical variants noted. Right subclavian angiograms: Aberrant right subclavian artery is noted. No opacification of the right vertebral artery. Otherwise, the visualized right subclavian artery and visualized branches of the thyrocervical trunk are unremarkable. Right common femoral artery ultrasound: Normal course and caliber of the right common femoral artery. Right CCA angiograms: Cervical angiograms show normal course and caliber of the visualized right common carotid and internal carotid arteries. There are no significant stenoses. Right ICA angiograms: There is brisk vascular contrast filling of the ACA and MCA vascular trees. Luminal caliber is smooth and tapering. No aneurysms or abnormally high-flow, early draining veins are seen. No regions of abnormal hypervascularity are noted. The visualized dural sinuses are patent. Right ECA and right occipital angiograms: No early venous drainage was noted. The intracranial branches of the right external carotid artery are unremarkable. Left vertebral artery angiograms: The left vertebral artery, basilar artery, and bilateral posterior cerebral arteries are unremarkable. Small infundibulum at the origin of a thalami perforator branch from the left P1/PCA is incidentally noted. Luminal  caliber is smooth and tapering. No aneurysms or abnormally high-flow, early draining veins are seen. No regions of abnormal hypervascularity are noted. The visualized dural sinuses are patent. Left CCA angiograms: Cervical angiograms show normal course and caliber of the visualized left common carotid and internal carotid arteries. There are no significant stenoses. Left ICA angiograms: There is brisk vascular contrast filling of the the ACA and MCA vascular trees. Luminal caliber is smooth and tapering. No aneurysms or abnormally high-flow, early draining veins are seen. No regions of abnormal hypervascularity are noted.  The visualized dural sinuses are patent. Left ECA angiograms: No early venous drainage was noted. The intracranial branches of the left external carotid artery are unremarkable. Right vertebral artery angiograms: The right vertebral artery originates from the right common carotid artery. The right vertebral artery, basilar artery, and bilateral posterior cerebral arteries are unremarkable. Luminal caliber is smooth and tapering. No aneurysms or abnormally high-flow, early draining veins are seen. No regions of abnormal hypervascularity are noted. The visualized dural sinuses are patent. PROCEDURE: Not applicable. IMPRESSION: No evidence of luminal irregularity, hemodynamically significant stenosis, occlusion or other significant vascular abnormality to explain patient's recent cerebral infarcts. No aneurysm, AVM or dural AV fistula. 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 PLAN: Results communicated to Dr. Erlinda Hong via secure text paging immediately after the angiogram was finalized. Electronically Signed   By: Pedro Earls M.D.   On: 04/13/2021 15:31   ECHOCARDIOGRAM COMPLETE BUBBLE STUDY  Result Date: 04/13/2021    ECHOCARDIOGRAM REPORT   Patient Name:   BERNARDETTE WALDRON Date of Exam: 04/13/2021  Medical Rec #:  [id number]  Height:       66.0 in Accession #:    [id number] Weight:       131.8 lb Date of Birth:  [birth year]  BSA:          1.675 m Patient Age:    19 years   BP:           132/88 mmHg Patient Gender: F          HR:           49 bpm. Exam Location:  Inpatient Procedure: 2D Echo, Cardiac Doppler and Color Doppler Indications:    Stroke  History:        Patient has no prior history of Echocardiogram examinations.  Sonographer:    Cammy Brochure Referring Phys: [id number] Martin  1. Left ventricular ejection fraction, by estimation, is 60 to 65%. The left ventricle has normal function. The left ventricle has no regional wall motion abnormalities. There is mild left ventricular hypertrophy. Left ventricular diastolic parameters were normal.  2. Right ventricular systolic function is normal. The right ventricular size is normal.  3. The mitral valve is normal in structure. Trivial mitral valve regurgitation.  4. The aortic valve was not well visualized. Aortic valve regurgitation is moderate. No aortic stenosis is present.  5. The inferior vena cava is normal in size with greater than 50% respiratory variability, suggesting right atrial pressure of 3 mmHg.  6. Agitated saline contrast bubble study was negative, with no evidence of any interatrial shunt. Conclusion(s)/Recommendation(s): AV leaflets appear thickened with eccentric posterior directed moderate AI. Given presentation with acute CVA, recommend TEE for further evaluation. FINDINGS  Left Ventricle: Left ventricular ejection fraction, by estimation, is 60 to 65%. The left ventricle has normal function. The left ventricle has no regional wall motion abnormalities. The left ventricular internal cavity size was normal in size. There is  mild left ventricular hypertrophy. Left ventricular diastolic parameters were normal. Right Ventricle: The right ventricular size is normal. No increase in right ventricular wall thickness.  Right ventricular systolic function is normal. Left Atrium: Left atrial size was normal in size. Right Atrium: Right atrial size was normal in size. Pericardium: There is no evidence of pericardial effusion. Mitral Valve: The mitral valve is normal in structure. Trivial mitral valve regurgitation. Tricuspid Valve: The tricuspid valve  is normal in structure. Tricuspid valve regurgitation is not demonstrated. Aortic Valve: The aortic valve was not well visualized. Aortic valve regurgitation is moderate. No aortic stenosis is present. Aortic valve mean gradient measures 2.7 mmHg. Aortic valve peak gradient measures 4.8 mmHg. Aortic valve area, by VTI measures 3.05 cm. Pulmonic Valve: The pulmonic valve was not well visualized. Pulmonic valve regurgitation is trivial. Aorta: The aortic root and ascending aorta are structurally normal, with no evidence of dilitation. Venous: The inferior vena cava is normal in size with greater than 50% respiratory variability, suggesting right atrial pressure of 3 mmHg. IAS/Shunts: No atrial level shunt detected by color flow Doppler. Agitated saline contrast was given intravenously to evaluate for intracardiac shunting. Agitated saline contrast bubble study was negative, with no evidence of any interatrial shunt.  LEFT VENTRICLE PLAX 2D LVIDd:         4.60 cm  Diastology LVIDs:         2.70 cm  LV e' medial:    11.70 cm/s LV PW:         0.90 cm  LV E/e' medial:  7.9 LV IVS:        1.00 cm  LV e' lateral:   17.20 cm/s LVOT diam:     2.30 cm  LV E/e' lateral: 5.4 LV SV:         83 LV SV Index:   50 LVOT Area:     4.15 cm  RIGHT VENTRICLE            IVC RV Basal diam:  3.20 cm    IVC diam: 1.30 cm RV S prime:     9.65 cm/s TAPSE (M-mode): 2.2 cm LEFT ATRIUM             Index       RIGHT ATRIUM           Index LA diam:        3.10 cm 1.85 cm/m  RA Area:     11.05 cm LA Vol (A2C):   46.9 ml 28.00 ml/m RA Volume:   23.80 ml  14.21 ml/m LA Vol (A4C):   56.2 ml 33.55 ml/m LA Biplane  Vol: 53.0 ml 31.64 ml/m  AORTIC VALVE AV Area (Vmax):    3.57 cm AV Area (Vmean):   3.20 cm AV Area (VTI):     3.05 cm AV Vmax:           109.67 cm/s AV Vmean:          73.133 cm/s AV VTI:            0.272 m AV Peak Grad:      4.8 mmHg AV Mean Grad:      2.7 mmHg LVOT Vmax:         94.10 cm/s LVOT Vmean:        56.300 cm/s LVOT VTI:          0.200 m LVOT/AV VTI ratio: 0.74  AORTA Ao Root diam: 2.70 cm Ao Asc diam:  2.40 cm MITRAL VALVE MV Area (PHT): 4.06 cm    SHUNTS MV Decel Time: 187 msec    Systemic VTI:  0.20 m MV E velocity: 92.40 cm/s  Systemic Diam: 2.30 cm MV A velocity: 53.10 cm/s MV E/A ratio:  1.74 Oswaldo Milian MD Electronically signed by Oswaldo Milian MD Signature Date/Time: 04/13/2021/10:43:59 AM    Final    VAS [place] LOWER EXTREMITY VENOUS (DVT)  Result Date: 04/13/2021  Lower  Venous DVT Study Patient Name:  METTE SOUTHGATE  Date of Exam:   04/13/2021 Medical Rec #: [id number]   Accession #:    [id number] Date of Birth: 02-Nov-2001   Patient Gender: F Patient Age:   32Y Exam Location:  [facility] [address] Procedure:      VAS [place] LOWER EXTREMITY VENOUS (DVT) Referring Phys: [id number] Rosalin Hawking --------------------------------------------------------------------------------  Indications: Stroke.  Limitations: Rt groin bandages. Comparison Study: no prior Performing Technologist: Abram Sander RVS  Examination Guidelines: A complete evaluation includes B-mode imaging, spectral Doppler, color Doppler, and power Doppler as needed of all accessible portions of each vessel. Bilateral testing is considered an integral part of a complete examination. Limited examinations for reoccurring indications may be performed as noted. The reflux portion of the exam is performed with the patient in reverse Trendelenburg.  +---------+---------------+---------+-----------+----------+-------------------+ RIGHT    CompressibilityPhasicitySpontaneityPropertiesThrombus Aging       +---------+---------------+---------+-----------+----------+-------------------+ CFV                                                   Not well visualized +---------+---------------+---------+-----------+----------+-------------------+ SFJ                                                   Not well visualized +---------+---------------+---------+-----------+----------+-------------------+ FV Prox  Full                                                             +---------+---------------+---------+-----------+----------+-------------------+ FV Mid   Full                                                             +---------+---------------+---------+-----------+----------+-------------------+ FV DistalFull                                                             +---------+---------------+---------+-----------+----------+-------------------+ PFV      Full                                                             +---------+---------------+---------+-----------+----------+-------------------+ POP      Full           Yes      Yes                                      +---------+---------------+---------+-----------+----------+-------------------+ PTV      Full                                                             +---------+---------------+---------+-----------+----------+-------------------+  PERO     Full                                                             +---------+---------------+---------+-----------+----------+-------------------+   +---------+---------------+---------+-----------+----------+--------------+ LEFT     CompressibilityPhasicitySpontaneityPropertiesThrombus Aging +---------+---------------+---------+-----------+----------+--------------+ CFV      Full           Yes      Yes                                 +---------+---------------+---------+-----------+----------+--------------+ SFJ      Full                                                         +---------+---------------+---------+-----------+----------+--------------+ FV Prox  Full                                                        +---------+---------------+---------+-----------+----------+--------------+ FV Mid   Full                                                        +---------+---------------+---------+-----------+----------+--------------+ FV DistalFull                                                        +---------+---------------+---------+-----------+----------+--------------+ PFV      Full                                                        +---------+---------------+---------+-----------+----------+--------------+ POP      Full           Yes      Yes                                 +---------+---------------+---------+-----------+----------+--------------+ PTV      Full                                                        +---------+---------------+---------+-----------+----------+--------------+ PERO     Full                                                        +---------+---------------+---------+-----------+----------+--------------+  Summary: BILATERAL: - No evidence of deep vein thrombosis seen in the lower extremities, bilaterally. -No evidence of popliteal cyst, bilaterally.   *See table(s) above for measurements and observations. Electronically signed by Deitra Mayo MD on 04/13/2021 at 4:14:59 PM.    Final    IR ANGIO INTRA EXTRACRAN SEL INTERNAL CAROTID BILAT MOD SED  Result Date: 04/13/2021 INDICATION: Elbony Mcclimans is a 20 year old female with a past medical history significant for asthma and anxiety. She presented to outside hospital on 04/11/2021 with speech difficulty. She was transferred and admitted to [facility] for stroke work-up. 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 A  diagnostic cerebral angiogram was requested to evaluate cerebral vasculature. EXAM: ULTRASOUND-GUIDED VASCULAR ACCESS DIAGNOSTIC CEREBRAL ANGIOGRAM COMPARISON:  MR angiogram Apr 12, 2021 MEDICATIONS: None. ANESTHESIA/SEDATION: Versed 1 mg IV; Fentanyl 50 mcg IV Moderate Sedation Time:  1 hour and 16 minutes The patient was continuously monitored during the procedure by the interventional radiology nurse under my direct supervision. CONTRAST:  75 mL of Omnipaque 240 milligrams/mL FLUOROSCOPY TIME:  Fluoroscopy Time: 13 minutes 36 seconds (281.6 mGy). COMPLICATIONS: None immediate. TECHNIQUE: Informed written consent was obtained from the patient and her mother after a thorough discussion of the procedural risks, benefits and alternatives. All questions were addressed. Maximal Sterile Barrier Technique was utilized including caps, mask, sterile gowns, sterile gloves, sterile drape, hand hygiene and skin antiseptic. A timeout was performed prior to the initiation of the procedure. Using the modified Seldinger technique and a micropuncture kit, access was gained to the distal right radial artery at the anatomical snuffbox and a 5 French sheath was placed. Real-time ultrasound guidance was utilized for vascular access including the acquisition of a permanent ultrasound image documenting patency of the accessed vessel. Slow intra arterial infusion of 5,000 IU heparin, 5 mg Verapamil and 884 mcg nitroglicerin diluted in patient's own blood was performed. No significant fluctuation in patient's blood pressure seen. Then, a right radial artery roadmap was obtained via sheath side port. Normal brachial artery branching pattern seen. No significant anatomical variation. The right radial artery caliber is adequate for vascular access. Next, a 5 [place] Simmons 2 glide catheter was navigated over a 0.035" Terumo Glidewire into the right subclavian artery under fluoroscopic guidance. Frontal right subclavian artery angiogram was  obtained. Due to inability to access the aortic arch from the aberrant right subclavian artery, decision was made to proceed with femoral access. The right groin was prepped and draped in the usual sterile fashion. Using a micropuncture kit and the modified Seldinger technique, access was gained to the right common femoral artery and a 5 French sheath was placed. Under fluoroscopy, a 5 [place] Berenstein 2 catheter was navigated over a 0.035" Terumo Glidewire into the aortic arch. The catheter was placed into the right common carotid artery. Frontal and lateral angiograms of the neck were obtained. Under biplane roadmap, the catheter was advanced into the right internal carotid artery. Frontal and lateral angiograms of the head were obtained followed by right anterior oblique and magnified lateral views of the head. The catheter was then retracted into the right common carotid artery and under fluoroscopy advanced into the right external carotid artery. Frontal and lateral angiograms of the head were obtained. The catheter was then placed into the left subclavian artery and advanced into the left vertebral artery. Frontal, lateral waters and magnified lateral views of the head were obtained. The catheter was then advanced  into the left common carotid artery. Frontal and lateral views of the neck were obtained. Under biplane roadmap, the catheter was advanced into the left internal carotid artery. Frontal, lateral, magnified left anterior oblique and magnified lateral views of the head were obtained. The catheter was then retracted and advanced into the left external carotid artery. Frontal and lateral angiograms of the head were obtained. The catheter was subsequently advanced into the right vertebral artery. Frontal and lateral views of the head were obtained. The catheter was subsequently withdrawn. A 5 French Perclose was utilized for right femoral access closure. Hemostasis was achieved after approximately 5  minutes of manual pressure hold. An inflatable band was placed and inflated over the right hand access site. The vascular sheath was withdrawn and the band was slowly deflated until brisk flow was noted through the arteriotomy site. At this point, the band was reinflated with additional 2 cc of air to obtain patent hemostasis. FINDINGS: Right radial artery ultrasound and right radial artery angiogram: The caliber of the distal right radial artery is appropriate for angiogram access. The right radial artery and the right ulnar artery have normal course and caliber. No significant anatomical variants noted. Right subclavian angiograms: Aberrant right subclavian artery is noted. No opacification of the right vertebral artery. Otherwise, the visualized right subclavian artery and visualized branches of the thyrocervical trunk are unremarkable. Right common femoral artery ultrasound: Normal course and caliber of the right common femoral artery. Right CCA angiograms: Cervical angiograms show normal course and caliber of the visualized right common carotid and internal carotid arteries. There are no significant stenoses. Right ICA angiograms: There is brisk vascular contrast filling of the ACA and MCA vascular trees. Luminal caliber is smooth and tapering. No aneurysms or abnormally high-flow, early draining veins are seen. No regions of abnormal hypervascularity are noted. The visualized dural sinuses are patent. Right ECA and right occipital angiograms: No early venous drainage was noted. The intracranial branches of the right external carotid artery are unremarkable. Left vertebral artery angiograms: The left vertebral artery, basilar artery, and bilateral posterior cerebral arteries are unremarkable. Small infundibulum at the origin of a thalami perforator branch from the left P1/PCA is incidentally noted. Luminal caliber is smooth and tapering. No aneurysms or abnormally high-flow, early draining veins are seen. No  regions of abnormal hypervascularity are noted. The visualized dural sinuses are patent. Left CCA angiograms: Cervical angiograms show normal course and caliber of the visualized left common carotid and internal carotid arteries. There are no significant stenoses. Left ICA angiograms: There is brisk vascular contrast filling of the the ACA and MCA vascular trees. Luminal caliber is smooth and tapering. No aneurysms or abnormally high-flow, early draining veins are seen. No regions of abnormal hypervascularity are noted. The visualized dural sinuses are patent. Left ECA angiograms: No early venous drainage was noted. The intracranial branches of the left external carotid artery are unremarkable. Right vertebral artery angiograms: The right vertebral artery originates from the right common carotid artery. The right vertebral artery, basilar artery, and bilateral posterior cerebral arteries are unremarkable. Luminal caliber is smooth and tapering. No aneurysms or abnormally high-flow, early draining veins are seen. No regions of abnormal hypervascularity are noted. The visualized dural sinuses are patent. PROCEDURE: Not applicable. IMPRESSION: No evidence of luminal irregularity, hemodynamically significant stenosis, occlusion or other significant vascular abnormality to explain patient's recent cerebral infarcts. No aneurysm, AVM or dural AV fistula. 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 PLAN: Results communicated to Dr. Erlinda Hong via secure text paging immediately after the angiogram was finalized. Electronically Signed   By: Pedro Earls M.D.   On: 04/13/2021 15:31   IR ANGIO VERTEBRAL SEL SUBCLAVIAN INNOMINATE BILAT MOD SED  Result Date: 04/13/2021 INDICATION: Terica Yogi is a 20 year old female with a past medical history significant for asthma and anxiety. She presented to outside hospital on 04/11/2021  with speech difficulty. She was transferred and admitted to [facility] for stroke work-up. 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 A diagnostic cerebral angiogram was requested to evaluate cerebral vasculature. EXAM: ULTRASOUND-GUIDED VASCULAR ACCESS DIAGNOSTIC CEREBRAL ANGIOGRAM COMPARISON:  MR angiogram Apr 12, 2021 MEDICATIONS: None. ANESTHESIA/SEDATION: Versed 1 mg IV; Fentanyl 50 mcg IV Moderate Sedation Time:  1 hour and 16 minutes The patient was continuously monitored during the procedure by the interventional radiology nurse under my direct supervision. CONTRAST:  75 mL of Omnipaque 240 milligrams/mL FLUOROSCOPY TIME:  Fluoroscopy Time: 13 minutes 36 seconds (281.6 mGy). COMPLICATIONS: None immediate. TECHNIQUE: Informed written consent was obtained from the patient and her mother after a thorough discussion of the procedural risks, benefits and alternatives. All questions were addressed. Maximal Sterile Barrier Technique was utilized including caps, mask, sterile gowns, sterile gloves, sterile drape, hand hygiene and skin antiseptic. A timeout was performed prior to the initiation of the procedure. Using the modified Seldinger technique and a micropuncture kit, access was gained to the distal right radial artery at the anatomical snuffbox and a 5 French sheath was placed. Real-time ultrasound guidance was utilized for vascular access including the acquisition of a permanent ultrasound image documenting patency of the accessed vessel. Slow intra arterial infusion of 5,000 IU heparin, 5 mg Verapamil and 517 mcg nitroglicerin diluted in patient's own blood was performed. No significant fluctuation in patient's blood pressure seen. Then, a right radial artery roadmap was obtained via sheath side port. Normal brachial artery branching pattern seen. No significant anatomical variation. The right radial artery caliber is adequate  for vascular access. Next, a 5 [place] Simmons 2 glide catheter was navigated over a 0.035" Terumo Glidewire into the right subclavian artery under fluoroscopic guidance. Frontal right subclavian artery angiogram was obtained. Due to inability to access the aortic arch from the aberrant right subclavian artery, decision was made to proceed with femoral access. The right groin was prepped and draped in the usual sterile fashion. Using a micropuncture kit and the modified Seldinger technique, access was gained to the right common femoral artery and a 5 French sheath was placed. Under fluoroscopy, a 5 [place] Berenstein 2 catheter was navigated over a 0.035" Terumo Glidewire into the aortic arch. The catheter was placed into the right common carotid artery. Frontal and lateral angiograms of the neck were obtained. Under biplane roadmap, the catheter was advanced into the right internal carotid artery. Frontal and lateral angiograms of the head were obtained followed by right anterior oblique and magnified lateral views of the head. The catheter was then retracted into the right common carotid artery and under fluoroscopy advanced into the right external carotid artery. Frontal and lateral angiograms of the head were obtained. The catheter was then placed into the left subclavian artery and advanced into the left vertebral artery. Frontal, lateral waters and magnified lateral views of the head were obtained. The catheter was then advanced into the left common carotid artery. Frontal and lateral views  of the neck were obtained. Under biplane roadmap, the catheter was advanced into the left internal carotid artery. Frontal, lateral, magnified left anterior oblique and magnified lateral views of the head were obtained. The catheter was then retracted and advanced into the left external carotid artery. Frontal and lateral angiograms of the head were obtained. The catheter was subsequently advanced into the right vertebral  artery. Frontal and lateral views of the head were obtained. The catheter was subsequently withdrawn. A 5 French Perclose was utilized for right femoral access closure. Hemostasis was achieved after approximately 5 minutes of manual pressure hold. An inflatable band was placed and inflated over the right hand access site. The vascular sheath was withdrawn and the band was slowly deflated until brisk flow was noted through the arteriotomy site. At this point, the band was reinflated with additional 2 cc of air to obtain patent hemostasis. FINDINGS: Right radial artery ultrasound and right radial artery angiogram: The caliber of the distal right radial artery is appropriate for angiogram access. The right radial artery and the right ulnar artery have normal course and caliber. No significant anatomical variants noted. Right subclavian angiograms: Aberrant right subclavian artery is noted. No opacification of the right vertebral artery. Otherwise, the visualized right subclavian artery and visualized branches of the thyrocervical trunk are unremarkable. Right common femoral artery ultrasound: Normal course and caliber of the right common femoral artery. Right CCA angiograms: Cervical angiograms show normal course and caliber of the visualized right common carotid and internal carotid arteries. There are no significant stenoses. Right ICA angiograms: There is brisk vascular contrast filling of the ACA and MCA vascular trees. Luminal caliber is smooth and tapering. No aneurysms or abnormally high-flow, early draining veins are seen. No regions of abnormal hypervascularity are noted. The visualized dural sinuses are patent. Right ECA and right occipital angiograms: No early venous drainage was noted. The intracranial branches of the right external carotid artery are unremarkable. Left vertebral artery angiograms: The left vertebral artery, basilar artery, and bilateral posterior cerebral arteries are unremarkable. Small  infundibulum at the origin of a thalami perforator branch from the left P1/PCA is incidentally noted. Luminal caliber is smooth and tapering. No aneurysms or abnormally high-flow, early draining veins are seen. No regions of abnormal hypervascularity are noted. The visualized dural sinuses are patent. Left CCA angiograms: Cervical angiograms show normal course and caliber of the visualized left common carotid and internal carotid arteries. There are no significant stenoses. Left ICA angiograms: There is brisk vascular contrast filling of the the ACA and MCA vascular trees. Luminal caliber is smooth and tapering. No aneurysms or abnormally high-flow, early draining veins are seen. No regions of abnormal hypervascularity are noted. The visualized dural sinuses are patent. Left ECA angiograms: No early venous drainage was noted. The intracranial branches of the left external carotid artery are unremarkable. Right vertebral artery angiograms: The right vertebral artery originates from the right common carotid artery. The right vertebral artery, basilar artery, and bilateral posterior cerebral arteries are unremarkable. Luminal caliber is smooth and tapering. No aneurysms or abnormally high-flow, early draining veins are seen. No regions of abnormal hypervascularity are noted. The visualized dural sinuses are patent. PROCEDURE: Not applicable. IMPRESSION: No evidence of luminal irregularity, hemodynamically significant stenosis, occlusion or other significant vascular abnormality to explain patient's recent cerebral infarcts. No aneurysm, AVM or dural AV fistula. 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 PLAN: Results communicated to Dr.  Xu via secure text paging immediately after the angiogram was finalized. Electronically Signed   By: Pedro Earls M.D.   On: 04/13/2021 15:31   IR ANGIO EXTERNAL CAROTID SEL  EXT CAROTID BILAT MOD SED  Result Date: 04/13/2021 INDICATION: Aanya Haynes is a 20 year old female with a past medical history significant for asthma and anxiety. She presented to outside hospital on 04/11/2021 with speech difficulty. She was transferred and admitted to [facility] for stroke work-up. 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 A diagnostic cerebral angiogram was requested to evaluate cerebral vasculature. EXAM: ULTRASOUND-GUIDED VASCULAR ACCESS DIAGNOSTIC CEREBRAL ANGIOGRAM COMPARISON:  MR angiogram Apr 12, 2021 MEDICATIONS: None. ANESTHESIA/SEDATION: Versed 1 mg IV; Fentanyl 50 mcg IV Moderate Sedation Time:  1 hour and 16 minutes The patient was continuously monitored during the procedure by the interventional radiology nurse under my direct supervision. CONTRAST:  75 mL of Omnipaque 240 milligrams/mL FLUOROSCOPY TIME:  Fluoroscopy Time: 13 minutes 36 seconds (281.6 mGy). COMPLICATIONS: None immediate. TECHNIQUE: Informed written consent was obtained from the patient and her mother after a thorough discussion of the procedural risks, benefits and alternatives. All questions were addressed. Maximal Sterile Barrier Technique was utilized including caps, mask, sterile gowns, sterile gloves, sterile drape, hand hygiene and skin antiseptic. A timeout was performed prior to the initiation of the procedure. Using the modified Seldinger technique and a micropuncture kit, access was gained to the distal right radial artery at the anatomical snuffbox and a 5 French sheath was placed. Real-time ultrasound guidance was utilized for vascular access including the acquisition of a permanent ultrasound image documenting patency of the accessed vessel. Slow intra arterial infusion of 5,000 IU heparin, 5 mg Verapamil and 163 mcg nitroglicerin diluted in patient's own blood was performed. No significant fluctuation in patient's blood  pressure seen. Then, a right radial artery roadmap was obtained via sheath side port. Normal brachial artery branching pattern seen. No significant anatomical variation. The right radial artery caliber is adequate for vascular access. Next, a 5 [place] Simmons 2 glide catheter was navigated over a 0.035" Terumo Glidewire into the right subclavian artery under fluoroscopic guidance. Frontal right subclavian artery angiogram was obtained. Due to inability to access the aortic arch from the aberrant right subclavian artery, decision was made to proceed with femoral access. The right groin was prepped and draped in the usual sterile fashion. Using a micropuncture kit and the modified Seldinger technique, access was gained to the right common femoral artery and a 5 French sheath was placed. Under fluoroscopy, a 5 [place] Berenstein 2 catheter was navigated over a 0.035" Terumo Glidewire into the aortic arch. The catheter was placed into the right common carotid artery. Frontal and lateral angiograms of the neck were obtained. Under biplane roadmap, the catheter was advanced into the right internal carotid artery. Frontal and lateral angiograms of the head were obtained followed by right anterior oblique and magnified lateral views of the head. The catheter was then retracted into the right common carotid artery and under fluoroscopy advanced into the right external carotid artery. Frontal and lateral angiograms of the head were obtained. The catheter was then placed into the left subclavian artery and advanced into the left vertebral artery. Frontal, lateral waters and magnified lateral views of the head were obtained. The catheter was then advanced into the left common carotid artery. Frontal and lateral views of the neck were obtained. Under biplane roadmap, the  catheter was advanced into the left internal carotid artery. Frontal, lateral, magnified left anterior oblique and magnified lateral views of the head were  obtained. The catheter was then retracted and advanced into the left external carotid artery. Frontal and lateral angiograms of the head were obtained. The catheter was subsequently advanced into the right vertebral artery. Frontal and lateral views of the head were obtained. The catheter was subsequently withdrawn. A 5 French Perclose was utilized for right femoral access closure. Hemostasis was achieved after approximately 5 minutes of manual pressure hold. An inflatable band was placed and inflated over the right hand access site. The vascular sheath was withdrawn and the band was slowly deflated until brisk flow was noted through the arteriotomy site. At this point, the band was reinflated with additional 2 cc of air to obtain patent hemostasis. FINDINGS: Right radial artery ultrasound and right radial artery angiogram: The caliber of the distal right radial artery is appropriate for angiogram access. The right radial artery and the right ulnar artery have normal course and caliber. No significant anatomical variants noted. Right subclavian angiograms: Aberrant right subclavian artery is noted. No opacification of the right vertebral artery. Otherwise, the visualized right subclavian artery and visualized branches of the thyrocervical trunk are unremarkable. Right common femoral artery ultrasound: Normal course and caliber of the right common femoral artery. Right CCA angiograms: Cervical angiograms show normal course and caliber of the visualized right common carotid and internal carotid arteries. There are no significant stenoses. Right ICA angiograms: There is brisk vascular contrast filling of the ACA and MCA vascular trees. Luminal caliber is smooth and tapering. No aneurysms or abnormally high-flow, early draining veins are seen. No regions of abnormal hypervascularity are noted. The visualized dural sinuses are patent. Right ECA and right occipital angiograms: No early venous drainage was noted. The  intracranial branches of the right external carotid artery are unremarkable. Left vertebral artery angiograms: The left vertebral artery, basilar artery, and bilateral posterior cerebral arteries are unremarkable. Small infundibulum at the origin of a thalami perforator branch from the left P1/PCA is incidentally noted. Luminal caliber is smooth and tapering. No aneurysms or abnormally high-flow, early draining veins are seen. No regions of abnormal hypervascularity are noted. The visualized dural sinuses are patent. Left CCA angiograms: Cervical angiograms show normal course and caliber of the visualized left common carotid and internal carotid arteries. There are no significant stenoses. Left ICA angiograms: There is brisk vascular contrast filling of the the ACA and MCA vascular trees. Luminal caliber is smooth and tapering. No aneurysms or abnormally high-flow, early draining veins are seen. No regions of abnormal hypervascularity are noted. The visualized dural sinuses are patent. Left ECA angiograms: No early venous drainage was noted. The intracranial branches of the left external carotid artery are unremarkable. Right vertebral artery angiograms: The right vertebral artery originates from the right common carotid artery. The right vertebral artery, basilar artery, and bilateral posterior cerebral arteries are unremarkable. Luminal caliber is smooth and tapering. No aneurysms or abnormally high-flow, early draining veins are seen. No regions of abnormal hypervascularity are noted. The visualized dural sinuses are patent. PROCEDURE: Not applicable. IMPRESSION: No evidence of luminal irregularity, hemodynamically significant stenosis, occlusion or other significant vascular abnormality to explain patient's recent cerebral infarcts. No aneurysm, AVM or dural AV fistula. 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 PLAN:  Results communicated to Dr. Erlinda Hong via secure text paging immediately after the  angiogram was finalized. Electronically Signed   By: Pedro Earls M.D.   On: 04/13/2021 15:31     Scheduled Meds: . aspirin EC  81 mg Oral Daily  . atorvastatin  20 mg Oral QHS  . clopidogrel  75 mg Oral Daily  . cyanocobalamin  1,000 mcg Intramuscular Daily   Continuous Infusions:    LOS: 2 days   Flora Lipps MD. on 04/14/2021 at 3:22 PM [facility] Office  [phone]

## 2021-04-14 NOTE — Anesthesia Procedure Notes (Signed)
Procedure Name: MAC Date/Time: 04/14/2021 1:27 PM Performed by: Valda Favia, CRNA Pre-anesthesia Checklist: Patient identified, Emergency Drugs available, Suction available and Patient being monitored Oxygen Delivery Method: Nasal cannula Placement Confirmation: positive ETCO2

## 2021-04-14 NOTE — Interval H&P Note (Signed)
History and Physical Interval Note:  04/14/2021 1:15 PM  Meghan Williamson  has presented today for surgery, with the diagnosis of STROKE.  The various methods of treatment have been discussed with the patient and family. After consideration of risks, benefits and other options for treatment, the patient has consented to  Procedure(s): TRANSESOPHAGEAL ECHOCARDIOGRAM (TEE) (N/A) as a surgical intervention.  The patient's history has been reviewed, patient examined, no change in status, stable for surgery.  I have reviewed the patient's chart and labs.  Questions were answered to the patient's satisfaction.     Meriam Sprague

## 2021-04-14 NOTE — Progress Notes (Signed)
STROKE TEAM PROGRESS NOTE   SUBJECTIVE (INTERVAL HISTORY) Her parents are at the bedside. She is sitting in bed, speech continues to improve. TEE no significant finding, no PFO. Hypercoagulable labs so far negative. B12 = 88, on supplement but normal homocysteine level. Right groin some bruises and ecchymoses but soft.    OBJECTIVE Temp:  [97 F (36.1 C)-98.1 F (36.7 C)] 97 F (36.1 C) (06/01 1405) Pulse Rate:  [54-85] 54 (06/01 1425) Cardiac Rhythm: Sinus bradycardia (06/01 0700) Resp:  [12-18] 15 (06/01 1425) BP: (116-165)/(58-98) 116/61 (06/01 1425) SpO2:  [98 %-100 %] 99 % (06/01 1425)  Recent Labs  Lab 04/11/21 1421 04/12/21 1513  GLUCAP 89 105*   Recent Labs  Lab 04/11/21 1444  NA 137  K 4.0  CL 105  CO2 24  GLUCOSE 87  BUN 10  CREATININE 0.79  CALCIUM 9.7   Recent Labs  Lab 04/11/21 1444  AST 12*  ALT 9  ALKPHOS 45  BILITOT 0.4  PROT 7.2  ALBUMIN 4.4   Recent Labs  Lab 04/11/21 1444  WBC 4.8  NEUTROABS 2.4  HGB 13.0  HCT 38.7  MCV 81.8  PLT 266   No results for input(s): CKTOTAL, CKMB, CKMBINDEX, TROPONINI in the last 168 hours. No results for input(s): LABPROT, INR in the last 72 hours. No results for input(s): COLORURINE, LABSPEC, [place], GLUCOSEU, HGBUR, BILIRUBINUR, KETONESUR, PROTEINUR, UROBILINOGEN, NITRITE, LEUKOCYTESUR in the last 72 hours.  Invalid input(s): APPERANCEUR     Component Value Date/Time   CHOL 151 04/12/2021 0147   TRIG 110 04/12/2021 0147   HDL 40 (L) 04/12/2021 0147   CHOLHDL 3.8 04/12/2021 0147   VLDL 22 04/12/2021 0147   LDLCALC 89 04/12/2021 0147   Lab Results  Component Value Date   HGBA1C QNSTST 04/12/2021      Component Value Date/Time   LABOPIA NONE DETECTED 04/11/2021 1444   COCAINSCRNUR NONE DETECTED 04/11/2021 1444   LABBENZ NONE DETECTED 04/11/2021 1444   AMPHETMU NONE DETECTED 04/11/2021 1444   THCU POSITIVE (A) 04/11/2021 1444   LABBARB NONE DETECTED 04/11/2021 1444    Recent Labs  Lab  04/11/21 1444  ETH <10    I have personally reviewed the radiological images below and agree with the radiology interpretations.  CT HEAD WO CONTRAST  Result Date: 04/11/2021 CLINICAL DATA:  Pt arrives to ED with c/o an expressive aphasic episode today starting at 1pm where she could not speak words. Pt states this episode may of lasted a few minutes. Pt reports she started a new job and it requires he to talk to a lot of new people. Pt does reports headache before the event and headache yesterday after twist her neck and hearing a "poping" noise EXAM: CT HEAD WITHOUT CONTRAST TECHNIQUE: Contiguous axial images were obtained from the base of the skull through the vertex without intravenous contrast. COMPARISON:  None. FINDINGS: Brain: No evidence of acute infarction, hemorrhage, hydrocephalus, extra-axial collection or mass lesion/mass effect. Vascular: No hyperdense vessel or unexpected calcification. Skull: Normal. Negative for fracture or focal lesion. Sinuses/Orbits: Visualized globes and orbits are unremarkable. Visualized sinuses are clear. Other: None. IMPRESSION: Normal unenhanced CT scan of the brain. Electronically Signed   By: Lajean Manes M.D.   On: 04/11/2021 14:49   MR ANGIO HEAD WO CONTRAST  Result Date: 04/12/2021 CLINICAL DATA:  Initial evaluation for acute dysarthria. EXAM: MRI HEAD WITHOUT CONTRAST MRA HEAD WITHOUT CONTRAST MRA NECK WITHOUT AND WITH CONTRAST TECHNIQUE: Multiplanar, multi-echo pulse sequences of the  brain and surrounding structures were acquired without intravenous contrast. Angiographic images of the Circle of Willis were acquired using MRA technique without intravenous contrast. Angiographic images of the neck were acquired using MRA technique without and with intravenous contrast. Carotid stenosis measurements (when applicable) are obtained utilizing NASCET criteria, using the distal internal carotid diameter as the denominator. CONTRAST:  38m GADAVIST GADOBUTROL 1  MMOL/ML IV SOLN COMPARISON:  Prior head CT from 04/11/2021. FINDINGS: MRI HEAD FINDINGS Brain: Cerebral volume within normal limits. Few scattered foci of patchy T2/FLAIR signal abnormality seen involving the periventricular and deep white matter both cerebral hemispheres, nonspecific, but suspected to reflect chronic microvascular ischemic disease, definitely advanced for age. Remote lacunar infarct present at the ventral medial left thalamus. A few tiny remote bilateral cerebellar infarcts noted. Patchy and confluent restricted diffusion involving primarily the cortex of the left temporal occipital and parietal region, consistent with acute ischemic infarct. A few suspected subtle foci of susceptibility artifact within the area of infarction, suggestive of minimal petechial hemorrhage. No frank hemorrhagic transformation or mass effect. There are additional scattered subcentimeter foci of restricted diffusion involving the right basal ganglia (series 5, images 79, 78). Patchy right PCA distribution infarcts involving the right occipital lobe and right hippocampus (series 5, images 71, 70). Few punctate bilateral cerebellar infarcts (series 5, images 60, 66 additional punctate right frontal cortical infarct (series 5, image 88). No other associated hemorrhage or mass effect. No mass lesion or midline shift. No hydrocephalus or extra-axial fluid collection. Pituitary gland within normal limits for age. Midline structures intact. Vascular: Major intracranial vascular flow voids are maintained. Skull and upper cervical spine: Craniocervical junction within normal limits. Bone marrow signal intensity normal. No scalp soft tissue abnormality. Sinuses/Orbits: Globes and orbital soft tissues within normal limits. Paranasal sinuses are clear. No significant mastoid effusion. Inner ear structures grossly normal. Other: None. MRA HEAD FINDINGS ANTERIOR CIRCULATION: Visualized distal cervical segments of the internal carotid  arteries are patent with antegrade flow. Petrous, cavernous, and supraclinoid segments patent without stenosis or other abnormality. A1 segments patent. Question 2 mm focal outpouching extending inferiorly from the anterior communicating artery complex (series 1037, image 8). Anterior cerebral arteries patent to their distal aspects without stenosis. No M1 stenosis or occlusion. Normal MCA bifurcations. MCA branches well perfused and symmetric. There is question of diffuse small vessel irregularity about the MCA branches bilaterally, best seen at the proximal left M2 anterior division (series 1037, image 8). POSTERIOR CIRCULATION: Both vertebral arteries patent to the vertebrobasilar junction without stenosis. Left vertebral artery slightly dominant. Both PICA origins patent and normal. Basilar patent to its distal aspect without stenosis. Superior cerebellar arteries patent bilaterally. Both PCAs supplied via the basilar as well as small bilateral posterior communicating arteries. PCAs well perfused to their distal aspects without proximal stenosis. Again there is question of possible subtle distal small vessel irregularity. No intracranial aneurysm or other vascular abnormality. MRA NECK FINDINGS AORTIC ARCH: Visualized aortic arch normal in caliber. Aberrant right subclavian artery noted. Right CCA arises directly from the aortic arch. No significant vascular irregularity or stenosis about the origin of the great vessels. RIGHT CAROTID SYSTEM: Right common and internal carotid arteries widely patent without stenosis, evidence for dissection, or occlusion. LEFT CAROTID SYSTEM: Left common and internal carotid arteries widely patent without stenosis, evidence for dissection, or occlusion. VERTEBRAL ARTERIES: Both vertebral arteries arise from the subclavian arteries. Left vertebral artery dominant. Vertebral arteries patent without stenosis, evidence for dissection, or occlusion. IMPRESSION: MRI HEAD: 1.  Acute  ischemic cortical infarct involving the left temporal occipital and parietal region. Possible minimal petechial hemorrhage without hemorrhagic transformation or mass effect. 2. Additional multifocal acute ischemic infarcts involving the right frontal lobe, right basal ganglia, right temporoccipital region, and cerebellum as above. No other associated hemorrhage or mass effect. 3. Underlying remote lacunar infarcts involving the left thalamus and cerebellum. 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 Changes are certainly advanced for patient age. Possible vasculitis would be the primary differential consideration given patient age and the presence of multiple acute and chronic infarcts. MRA HEAD: 1. Question subtle diffuse small vessel irregularity about the intracranial circulation. Differential considerations include early atherosclerotic disease versus changes of vasculopathy. 2. Otherwise normal MRA appearance of the medium and large intracranial arterial vessels. No large vessel occlusion. No hemodynamically significant or correctable stenosis. 3. Question 2 mm focal outpouching arising from the anterior communicating artery complex, which could reflect focal vascular tortuosity versus a small aneurysm. Attention at follow-up recommended. MRA NECK: 1. Normal MRA of the neck. No hemodynamically significant stenosis or other acute vascular abnormality. 2. Aberrant right subclavian artery. Electronically Signed   By: Jeannine Boga M.D.   On: 04/12/2021 05:48   MR ANGIO NECK W WO CONTRAST  Result Date: 04/12/2021 CLINICAL DATA:  Initial evaluation for acute dysarthria. EXAM: MRI HEAD WITHOUT CONTRAST MRA HEAD WITHOUT CONTRAST MRA NECK WITHOUT AND WITH CONTRAST TECHNIQUE: Multiplanar, multi-echo pulse sequences of the brain and surrounding structures were acquired without  intravenous contrast. Angiographic images of the Circle of Willis were acquired using MRA technique without intravenous contrast. Angiographic images of the neck were acquired using MRA technique without and with intravenous contrast. Carotid stenosis measurements (when applicable) are obtained utilizing NASCET criteria, using the distal internal carotid diameter as the denominator. CONTRAST:  25m GADAVIST GADOBUTROL 1 MMOL/ML IV SOLN COMPARISON:  Prior head CT from 04/11/2021. FINDINGS: MRI HEAD FINDINGS Brain: Cerebral volume within normal limits. Few scattered foci of patchy T2/FLAIR signal abnormality seen involving the periventricular and deep white matter both cerebral hemispheres, nonspecific, but suspected to reflect chronic microvascular ischemic disease, definitely advanced for age. Remote lacunar infarct present at the ventral medial left thalamus. A few tiny remote bilateral cerebellar infarcts noted. Patchy and confluent restricted diffusion involving primarily the cortex of the left temporal occipital and parietal region, consistent with acute ischemic infarct. A few suspected subtle foci of susceptibility artifact within the area of infarction, suggestive of minimal petechial hemorrhage. No frank hemorrhagic transformation or mass effect. There are additional scattered subcentimeter foci of restricted diffusion involving the right basal ganglia (series 5, images 79, 78). Patchy right PCA distribution infarcts involving the right occipital lobe and right hippocampus (series 5, images 71, 70). Few punctate bilateral cerebellar infarcts (series 5, images 60, 66 additional punctate right frontal cortical infarct (series 5, image 88). No other associated hemorrhage or mass effect. No mass lesion or midline shift. No hydrocephalus or extra-axial fluid collection. Pituitary gland within normal limits for age. Midline structures intact. Vascular: Major intracranial vascular flow voids are maintained. Skull  and upper cervical spine: Craniocervical junction within normal limits. Bone marrow signal intensity normal. No scalp soft tissue abnormality. Sinuses/Orbits: Globes and orbital soft tissues within normal limits. Paranasal sinuses are clear. No significant mastoid effusion. Inner ear structures grossly normal. Other: None. MRA HEAD FINDINGS ANTERIOR CIRCULATION: Visualized distal cervical segments of the internal carotid arteries are patent with antegrade  flow. Petrous, cavernous, and supraclinoid segments patent without stenosis or other abnormality. A1 segments patent. Question 2 mm focal outpouching extending inferiorly from the anterior communicating artery complex (series 1037, image 8). Anterior cerebral arteries patent to their distal aspects without stenosis. No M1 stenosis or occlusion. Normal MCA bifurcations. MCA branches well perfused and symmetric. There is question of diffuse small vessel irregularity about the MCA branches bilaterally, best seen at the proximal left M2 anterior division (series 1037, image 8). POSTERIOR CIRCULATION: Both vertebral arteries patent to the vertebrobasilar junction without stenosis. Left vertebral artery slightly dominant. Both PICA origins patent and normal. Basilar patent to its distal aspect without stenosis. Superior cerebellar arteries patent bilaterally. Both PCAs supplied via the basilar as well as small bilateral posterior communicating arteries. PCAs well perfused to their distal aspects without proximal stenosis. Again there is question of possible subtle distal small vessel irregularity. No intracranial aneurysm or other vascular abnormality. MRA NECK FINDINGS AORTIC ARCH: Visualized aortic arch normal in caliber. Aberrant right subclavian artery noted. Right CCA arises directly from the aortic arch. No significant vascular irregularity or stenosis about the origin of the great vessels. RIGHT CAROTID SYSTEM: Right common and internal carotid arteries widely  patent without stenosis, evidence for dissection, or occlusion. LEFT CAROTID SYSTEM: Left common and internal carotid arteries widely patent without stenosis, evidence for dissection, or occlusion. VERTEBRAL ARTERIES: Both vertebral arteries arise from the subclavian arteries. Left vertebral artery dominant. Vertebral arteries patent without stenosis, evidence for dissection, or occlusion. IMPRESSION: MRI HEAD: 1. 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 Possible minimal petechial hemorrhage without hemorrhagic transformation or mass effect. 2. Additional multifocal acute ischemic infarcts involving the right frontal lobe, right basal ganglia, right temporoccipital region, and cerebellum as above. No other associated hemorrhage or mass effect. 3. Underlying remote lacunar infarcts involving the left thalamus and cerebellum. 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 Changes are certainly advanced for patient age. Possible vasculitis would be the primary differential consideration given patient age and the presence of multiple acute and chronic infarcts. MRA HEAD: 1. Question subtle diffuse small vessel irregularity about the intracranial circulation. Differential considerations include early atherosclerotic disease versus changes of vasculopathy. 2. Otherwise normal MRA appearance of the medium and large intracranial arterial vessels. No large vessel occlusion. No hemodynamically significant or correctable stenosis. 3. Question 2 mm focal outpouching arising from the anterior communicating artery complex, which could reflect focal vascular tortuosity versus a small aneurysm. Attention at follow-up recommended. MRA NECK: 1. Normal MRA of the neck. No hemodynamically significant stenosis or other acute vascular abnormality. 2. Aberrant  right subclavian artery. Electronically Signed   By: Jeannine Boga M.D.   On: 04/12/2021 05:48   MR BRAIN WO CONTRAST  Result Date: 04/12/2021 CLINICAL DATA:  Initial evaluation for acute dysarthria. EXAM: MRI HEAD WITHOUT CONTRAST MRA HEAD WITHOUT CONTRAST MRA NECK WITHOUT AND WITH CONTRAST TECHNIQUE: Multiplanar, multi-echo pulse sequences of the brain and surrounding structures were acquired without intravenous contrast. Angiographic images of the Circle of Willis were acquired using MRA technique without intravenous contrast. Angiographic images of the neck were acquired using MRA technique without and with intravenous contrast. Carotid stenosis measurements (when applicable) are obtained utilizing NASCET criteria, using the distal internal carotid diameter as the denominator. CONTRAST:  11m GADAVIST GADOBUTROL 1 MMOL/ML IV SOLN COMPARISON:  Prior head CT from 04/11/2021. FINDINGS: MRI HEAD FINDINGS Brain:  Cerebral volume within normal limits. Few scattered foci of patchy T2/FLAIR signal abnormality seen involving the periventricular and deep white matter both cerebral hemispheres, nonspecific, but suspected to reflect chronic microvascular ischemic disease, definitely advanced for age. Remote lacunar infarct present at the ventral medial left thalamus. A few tiny remote bilateral cerebellar infarcts noted. Patchy and confluent restricted diffusion involving primarily the cortex of the left temporal occipital and parietal region, consistent with acute ischemic infarct. A few suspected subtle foci of susceptibility artifact within the area of infarction, suggestive of minimal petechial hemorrhage. No frank hemorrhagic transformation or mass effect. There are additional scattered subcentimeter foci of restricted diffusion involving the right basal ganglia (series 5, images 79, 78). Patchy right PCA distribution infarcts involving the right occipital lobe and right hippocampus (series 5, images 71, 70).  Few punctate bilateral cerebellar infarcts (series 5, images 60, 66 additional punctate right frontal cortical infarct (series 5, image 88). No other associated hemorrhage or mass effect. No mass lesion or midline shift. No hydrocephalus or extra-axial fluid collection. Pituitary gland within normal limits for age. Midline structures intact. Vascular: Major intracranial vascular flow voids are maintained. Skull and upper cervical spine: Craniocervical junction within normal limits. Bone marrow signal intensity normal. No scalp soft tissue abnormality. Sinuses/Orbits: Globes and orbital soft tissues within normal limits. Paranasal sinuses are clear. No significant mastoid effusion. Inner ear structures grossly normal. Other: None. MRA HEAD FINDINGS ANTERIOR CIRCULATION: Visualized distal cervical segments of the internal carotid arteries are patent with antegrade flow. Petrous, cavernous, and supraclinoid segments patent without stenosis or other abnormality. A1 segments patent. Question 2 mm focal outpouching extending inferiorly from the anterior communicating artery complex (series 1037, image 8). Anterior cerebral arteries patent to their distal aspects without stenosis. No M1 stenosis or occlusion. Normal MCA bifurcations. MCA branches well perfused and symmetric. There is question of diffuse small vessel irregularity about the MCA branches bilaterally, best seen at the proximal left M2 anterior division (series 1037, image 8). POSTERIOR CIRCULATION: Both vertebral arteries patent to the vertebrobasilar junction without stenosis. Left vertebral artery slightly dominant. Both PICA origins patent and normal. Basilar patent to its distal aspect without stenosis. Superior cerebellar arteries patent bilaterally. Both PCAs supplied via the basilar as well as small bilateral posterior communicating arteries. PCAs well perfused to their distal aspects without proximal stenosis. Again there is question of possible subtle  distal small vessel irregularity. No intracranial aneurysm or other vascular abnormality. MRA NECK FINDINGS AORTIC ARCH: Visualized aortic arch normal in caliber. Aberrant right subclavian artery noted. Right CCA arises directly from the aortic arch. No significant vascular irregularity or stenosis about the origin of the great vessels. RIGHT CAROTID SYSTEM: Right common and internal carotid arteries widely patent without stenosis, evidence for dissection, or occlusion. LEFT CAROTID SYSTEM: Left common and internal carotid arteries widely patent without stenosis, evidence for dissection, or occlusion. VERTEBRAL ARTERIES: Both vertebral arteries arise from the subclavian arteries. Left vertebral artery dominant. Vertebral arteries patent without stenosis, evidence for dissection, or occlusion. IMPRESSION: MRI HEAD: 1. 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 Possible minimal petechial hemorrhage without hemorrhagic transformation or mass effect. 2. Additional multifocal acute ischemic infarcts involving the right frontal lobe, right basal ganglia, right temporoccipital region, and cerebellum as above. No other associated hemorrhage or mass effect. 3. Underlying remote lacunar infarcts involving the left thalamus and cerebellum. Patchy T2/FLAIR signal abnormality involving the periventricular and deep white matter both cerebral hemispheres most characteristic of chronic  microvascular ischemic disease. Changes are certainly advanced for patient age. Possible vasculitis would be the primary differential consideration given patient age and the presence of multiple acute and chronic infarcts. MRA HEAD: 1. Question subtle diffuse small vessel irregularity about the intracranial circulation. Differential considerations include early atherosclerotic disease versus changes of vasculopathy. 2. Otherwise normal MRA appearance of the medium and large intracranial arterial  vessels. No large vessel occlusion. No hemodynamically significant or correctable stenosis. 3. Question 2 mm focal outpouching arising from the anterior communicating artery complex, which could reflect focal vascular tortuosity versus a small aneurysm. Attention at follow-up recommended. MRA NECK: 1. Normal MRA of the neck. No hemodynamically significant stenosis or other acute vascular abnormality. 2. Aberrant right subclavian artery. Electronically Signed   By: Jeannine Boga M.D.   On: 04/12/2021 05:48   IR US Guide Vasc Access Right  Result Date: 04/13/2021 INDICATION: Zahlia Deshazer is a 20 year old female with a past medical history significant for asthma and anxiety. She presented to outside hospital on 04/11/2021 with speech difficulty. She was transferred and admitted to [facility] for stroke work-up. 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 A diagnostic cerebral angiogram was requested to evaluate cerebral vasculature. EXAM: ULTRASOUND-GUIDED VASCULAR ACCESS DIAGNOSTIC CEREBRAL ANGIOGRAM COMPARISON:  MR angiogram Apr 12, 2021 MEDICATIONS: None. ANESTHESIA/SEDATION: Versed 1 mg IV; Fentanyl 50 mcg IV Moderate Sedation Time:  1 hour and 16 minutes The patient was continuously monitored during the procedure by the interventional radiology nurse under my direct supervision. CONTRAST:  75 mL of Omnipaque 240 milligrams/mL FLUOROSCOPY TIME:  Fluoroscopy Time: 13 minutes 36 seconds (281.6 mGy). COMPLICATIONS: None immediate. TECHNIQUE: Informed written consent was obtained from the patient and her mother after a thorough discussion of the procedural risks, benefits and alternatives. All questions were addressed. Maximal Sterile Barrier Technique was utilized including caps, mask, sterile gowns, sterile gloves, sterile drape, hand hygiene and skin antiseptic. A timeout was performed prior to the initiation of the procedure.  Using the modified Seldinger technique and a micropuncture kit, access was gained to the distal right radial artery at the anatomical snuffbox and a 5 French sheath was placed. Real-time ultrasound guidance was utilized for vascular access including the acquisition of a permanent ultrasound image documenting patency of the accessed vessel. Slow intra arterial infusion of 5,000 IU heparin, 5 mg Verapamil and 017 mcg nitroglicerin diluted in patient's own blood was performed. No significant fluctuation in patient's blood pressure seen. Then, a right radial artery roadmap was obtained via sheath side port. Normal brachial artery branching pattern seen. No significant anatomical variation. The right radial artery caliber is adequate for vascular access. Next, a 5 [place] Simmons 2 glide catheter was navigated over a 0.035" Terumo Glidewire into the right subclavian artery under fluoroscopic guidance. Frontal right subclavian artery angiogram was obtained. Due to inability to access the aortic arch from the aberrant right subclavian artery, decision was made to proceed with femoral access. The right groin was prepped and draped in the usual sterile fashion. Using a micropuncture kit and the modified Seldinger technique, access was gained to the right common femoral artery and a 5 French sheath was placed. Under fluoroscopy, a 5 [place] Berenstein 2 catheter was navigated over a 0.035" Terumo Glidewire into the aortic arch. The catheter was placed into the right common carotid artery. Frontal and lateral angiograms of the neck were obtained. Under biplane roadmap, the catheter was advanced into the  right internal carotid artery. Frontal and lateral angiograms of the head were obtained followed by right anterior oblique and magnified lateral views of the head. The catheter was then retracted into the right common carotid artery and under fluoroscopy advanced into the right external carotid artery. Frontal and lateral  angiograms of the head were obtained. The catheter was then placed into the left subclavian artery and advanced into the left vertebral artery. Frontal, lateral waters and magnified lateral views of the head were obtained. The catheter was then advanced into the left common carotid artery. Frontal and lateral views of the neck were obtained. Under biplane roadmap, the catheter was advanced into the left internal carotid artery. Frontal, lateral, magnified left anterior oblique and magnified lateral views of the head were obtained. The catheter was then retracted and advanced into the left external carotid artery. Frontal and lateral angiograms of the head were obtained. The catheter was subsequently advanced into the right vertebral artery. Frontal and lateral views of the head were obtained. The catheter was subsequently withdrawn. A 5 French Perclose was utilized for right femoral access closure. Hemostasis was achieved after approximately 5 minutes of manual pressure hold. An inflatable band was placed and inflated over the right hand access site. The vascular sheath was withdrawn and the band was slowly deflated until brisk flow was noted through the arteriotomy site. At this point, the band was reinflated with additional 2 cc of air to obtain patent hemostasis. FINDINGS: Right radial artery ultrasound and right radial artery angiogram: The caliber of the distal right radial artery is appropriate for angiogram access. The right radial artery and the right ulnar artery have normal course and caliber. No significant anatomical variants noted. Right subclavian angiograms: Aberrant right subclavian artery is noted. No opacification of the right vertebral artery. Otherwise, the visualized right subclavian artery and visualized branches of the thyrocervical trunk are unremarkable. Right common femoral artery ultrasound: Normal course and caliber of the right common femoral artery. Right CCA angiograms: Cervical  angiograms show normal course and caliber of the visualized right common carotid and internal carotid arteries. There are no significant stenoses. Right ICA angiograms: There is brisk vascular contrast filling of the ACA and MCA vascular trees. Luminal caliber is smooth and tapering. No aneurysms or abnormally high-flow, early draining veins are seen. No regions of abnormal hypervascularity are noted. The visualized dural sinuses are patent. Right ECA and right occipital angiograms: No early venous drainage was noted. The intracranial branches of the right external carotid artery are unremarkable. Left vertebral artery angiograms: The left vertebral artery, basilar artery, and bilateral posterior cerebral arteries are unremarkable. Small infundibulum at the origin of a thalami perforator branch from the left P1/PCA is incidentally noted. Luminal caliber is smooth and tapering. No aneurysms or abnormally high-flow, early draining veins are seen. No regions of abnormal hypervascularity are noted. The visualized dural sinuses are patent. Left CCA angiograms: Cervical angiograms show normal course and caliber of the visualized left common carotid and internal carotid arteries. There are no significant stenoses. Left ICA angiograms: There is brisk vascular contrast filling of the the ACA and MCA vascular trees. Luminal caliber is smooth and tapering. No aneurysms or abnormally high-flow, early draining veins are seen. No regions of abnormal hypervascularity are noted. The visualized dural sinuses are patent. Left ECA angiograms: No early venous drainage was noted. The intracranial branches of the left external carotid artery are unremarkable. Right vertebral artery angiograms: The right vertebral artery originates from the right common  carotid artery. The right vertebral artery, basilar artery, and bilateral posterior cerebral arteries are unremarkable. Luminal caliber is smooth and tapering. No aneurysms or abnormally  high-flow, early draining veins are seen. No regions of abnormal hypervascularity are noted. The visualized dural sinuses are patent. PROCEDURE: Not applicable. IMPRESSION: No evidence of luminal irregularity, hemodynamically significant stenosis, occlusion or other significant vascular abnormality to explain patient's recent cerebral infarcts. No aneurysm, AVM or dural AV fistula. 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 PLAN: Results communicated to Dr. Erlinda Hong via secure text paging immediately after the angiogram was finalized. Electronically Signed   By: Pedro Earls M.D.   On: 04/13/2021 15:31   EEG adult  Result Date: 04/12/2021 Lora Havens, MD     04/12/2021 12:34 PM Patient Name: Maelys Kinnick MRN: [id number] Epilepsy Attending: Lora Havens Referring Physician/Provider: Dr Lesleigh Noe Date: 04/12/2021 Duration: 27.42 mins Patient history: 19 year old female with no significant medical history pertaining to strokes presented for evaluation of expressive aphasia and was found to have left temporal, occipital, parietal region acute ischemic infarcts.   EEG to evaluate for seizures. Level of alertness: Awake AEDs during EEG study: None Technical aspects: This EEG study was done with scalp electrodes positioned according to the 10-20 International system of electrode placement. Electrical activity was acquired at a sampling rate of 500Hz  and reviewed with a high frequency filter of 70Hz  and a low frequency filter of 1Hz . EEG data were recorded continuously and digitally stored. Description: The posterior dominant rhythm consists of 9-10 Hz activity of moderate voltage (25-35 uV) seen predominantly in posterior head regions, symmetric and reactive to eye opening and eye closing. EEG showed continuous 3 to 6 Hz theta-delta slowing in left hemisphere, maximal left temporal region which at times  appears rhythmic. Hyperventilation and photic stimulation were not performed.   ABNORMALITY -Continuous slow, left hemisphere, maximal left temporal region IMPRESSION: This study is suggestive of cortical dysfunction left hemisphere, maximal left temporal region likely secondary to underlying stroke.  No seizures or definite epileptiform discharges were seen throughout the recording. Lora Havens   ECHOCARDIOGRAM COMPLETE BUBBLE STUDY  Result Date: 04/13/2021    ECHOCARDIOGRAM REPORT   Patient Name:   IVIANA BLASINGAME Date of Exam: 04/13/2021 Medical Rec #:  [id number]  Height:       66.0 in Accession #:    [id number] Weight:       131.8 lb Date of Birth:  October 20, 2001  BSA:          1.675 m Patient Age:    19 years   BP:           132/88 mmHg Patient Gender: F          HR:           49 bpm. Exam Location:  Inpatient Procedure: 2D Echo, Cardiac Doppler and Color Doppler Indications:    Stroke  History:        Patient has no prior history of Echocardiogram examinations.  Sonographer:    Cammy Brochure Referring Phys: [id number] [place]  1. Left ventricular ejection fraction, by estimation, is 60 to 65%. The left ventricle has normal function. The left ventricle has no regional wall motion abnormalities. There is mild left ventricular hypertrophy. Left ventricular diastolic parameters were normal.  2. Right ventricular systolic function is normal. The right ventricular size is normal.  3.  The mitral valve is normal in structure. Trivial mitral valve regurgitation.  4. The aortic valve was not well visualized. Aortic valve regurgitation is moderate. No aortic stenosis is present.  5. The inferior vena cava is normal in size with greater than 50% respiratory variability, suggesting right atrial pressure of 3 mmHg.  6. Agitated saline contrast bubble study was negative, with no evidence of any interatrial shunt. Conclusion(s)/Recommendation(s): AV leaflets appear thickened with eccentric posterior  directed moderate AI. Given presentation with acute CVA, recommend TEE for further evaluation. FINDINGS  Left Ventricle: Left ventricular ejection fraction, by estimation, is 60 to 65%. The left ventricle has normal function. The left ventricle has no regional wall motion abnormalities. The left ventricular internal cavity size was normal in size. There is  mild left ventricular hypertrophy. Left ventricular diastolic parameters were normal. Right Ventricle: The right ventricular size is normal. No increase in right ventricular wall thickness. Right ventricular systolic function is normal. Left Atrium: Left atrial size was normal in size. Right Atrium: Right atrial size was normal in size. Pericardium: There is no evidence of pericardial effusion. Mitral Valve: The mitral valve is normal in structure. Trivial mitral valve regurgitation. Tricuspid Valve: The tricuspid valve is normal in structure. Tricuspid valve regurgitation is not demonstrated. Aortic Valve: The aortic valve was not well visualized. Aortic valve regurgitation is moderate. No aortic stenosis is present. Aortic valve mean gradient measures 2.7 mmHg. Aortic valve peak gradient measures 4.8 mmHg. Aortic valve area, by VTI measures 3.05 cm. Pulmonic Valve: The pulmonic valve was not well visualized. Pulmonic valve regurgitation is trivial. Aorta: The aortic root and ascending aorta are structurally normal, with no evidence of dilitation. Venous: The inferior vena cava is normal in size with greater than 50% respiratory variability, suggesting right atrial pressure of 3 mmHg. IAS/Shunts: No atrial level shunt detected by color flow Doppler. Agitated saline contrast was given intravenously to evaluate for intracardiac shunting. Agitated saline contrast bubble study was negative, with no evidence of any interatrial shunt.  LEFT VENTRICLE PLAX 2D LVIDd:         4.60 cm  Diastology LVIDs:         2.70 cm  LV e' medial:    11.70 cm/s LV PW:         0.90 cm   LV E/e' medial:  7.9 LV IVS:        1.00 cm  LV e' lateral:   17.20 cm/s LVOT diam:     2.30 cm  LV E/e' lateral: 5.4 LV SV:         83 LV SV Index:   50 LVOT Area:     4.15 cm  RIGHT VENTRICLE            IVC RV Basal diam:  3.20 cm    IVC diam: 1.30 cm RV S prime:     9.65 cm/s TAPSE (M-mode): 2.2 cm LEFT ATRIUM             Index       RIGHT ATRIUM           Index LA diam:        3.10 cm 1.85 cm/m  RA Area:     11.05 cm LA Vol (A2C):   46.9 ml 28.00 ml/m RA Volume:   23.80 ml  14.21 ml/m LA Vol (A4C):   56.2 ml 33.55 ml/m LA Biplane Vol: 53.0 ml 31.64 ml/m  AORTIC VALVE AV Area (Vmax):    3.57  cm AV Area (Vmean):   3.20 cm AV Area (VTI):     3.05 cm AV Vmax:           109.67 cm/s AV Vmean:          73.133 cm/s AV VTI:            0.272 m AV Peak Grad:      4.8 mmHg AV Mean Grad:      2.7 mmHg LVOT Vmax:         94.10 cm/s LVOT Vmean:        56.300 cm/s LVOT VTI:          0.200 m LVOT/AV VTI ratio: 0.74  AORTA Ao Root diam: 2.70 cm Ao Asc diam:  2.40 cm MITRAL VALVE MV Area (PHT): 4.06 cm    SHUNTS MV Decel Time: 187 msec    Systemic VTI:  0.20 m MV E velocity: 92.40 cm/s  Systemic Diam: 2.30 cm MV A velocity: 53.10 cm/s MV E/A ratio:  1.74 Oswaldo Milian MD Electronically signed by Oswaldo Milian MD Signature Date/Time: 04/13/2021/10:43:59 AM    Final    VAS [place] LOWER EXTREMITY VENOUS (DVT)  Result Date: 04/13/2021  Lower Venous DVT Study Patient Name:  CHRISTIANN HAGERTY  Date of Exam:   04/13/2021 Medical Rec #: [id number]   Accession #:    [id number] Date of Birth: 2001/02/04   Patient Gender: F Patient Age:   24Y Exam Location:  [facility] Procedure:      VAS [place] LOWER EXTREMITY VENOUS (DVT) Referring Phys: [id number] Rosalin Hawking --------------------------------------------------------------------------------  Indications: Stroke.  Limitations: Rt groin bandages. Comparison Study: no prior Performing Technologist: Abram Sander RVS  Examination Guidelines: A complete evaluation includes B-mode  imaging, spectral Doppler, color Doppler, and power Doppler as needed of all accessible portions of each vessel. Bilateral testing is considered an integral part of a complete examination. Limited examinations for reoccurring indications may be performed as noted. The reflux portion of the exam is performed with the patient in reverse Trendelenburg.  +---------+---------------+---------+-----------+----------+-------------------+ RIGHT    CompressibilityPhasicitySpontaneityPropertiesThrombus Aging      +---------+---------------+---------+-----------+----------+-------------------+ CFV                                                   Not well visualized +---------+---------------+---------+-----------+----------+-------------------+ SFJ                                                   Not well visualized +---------+---------------+---------+-----------+----------+-------------------+ FV Prox  Full                                                             +---------+---------------+---------+-----------+----------+-------------------+ FV Mid   Full                                                             +---------+---------------+---------+-----------+----------+-------------------+  FV DistalFull                                                             +---------+---------------+---------+-----------+----------+-------------------+ PFV      Full                                                             +---------+---------------+---------+-----------+----------+-------------------+ POP      Full           Yes      Yes                                      +---------+---------------+---------+-----------+----------+-------------------+ PTV      Full                                                             +---------+---------------+---------+-----------+----------+-------------------+ PERO     Full                                                              +---------+---------------+---------+-----------+----------+-------------------+   +---------+---------------+---------+-----------+----------+--------------+ LEFT     CompressibilityPhasicitySpontaneityPropertiesThrombus Aging +---------+---------------+---------+-----------+----------+--------------+ CFV      Full           Yes      Yes                                 +---------+---------------+---------+-----------+----------+--------------+ SFJ      Full                                                        +---------+---------------+---------+-----------+----------+--------------+ FV Prox  Full                                                        +---------+---------------+---------+-----------+----------+--------------+ FV Mid   Full                                                        +---------+---------------+---------+-----------+----------+--------------+ FV DistalFull                                                        +---------+---------------+---------+-----------+----------+--------------+  PFV      Full                                                        +---------+---------------+---------+-----------+----------+--------------+ POP      Full           Yes      Yes                                 +---------+---------------+---------+-----------+----------+--------------+ PTV      Full                                                        +---------+---------------+---------+-----------+----------+--------------+ PERO     Full                                                        +---------+---------------+---------+-----------+----------+--------------+     Summary: BILATERAL: - No evidence of deep vein thrombosis seen in the lower extremities, bilaterally. -No evidence of popliteal cyst, bilaterally.   *See table(s) above for measurements and observations. Electronically signed by Deitra Mayo MD on 04/13/2021 at 4:14:59 PM.    Final    IR ANGIO INTRA EXTRACRAN SEL INTERNAL CAROTID BILAT MOD SED  Result Date: 04/13/2021 INDICATION: Candie Gintz is a 20 year old female with a past medical history significant for asthma and anxiety. She presented to outside hospital on 04/11/2021 with speech difficulty. She was transferred and admitted to [facility] for stroke work-up. 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 A diagnostic cerebral angiogram was requested to evaluate cerebral vasculature. EXAM: ULTRASOUND-GUIDED VASCULAR ACCESS DIAGNOSTIC CEREBRAL ANGIOGRAM COMPARISON:  MR angiogram Apr 12, 2021 MEDICATIONS: None. ANESTHESIA/SEDATION: Versed 1 mg IV; Fentanyl 50 mcg IV Moderate Sedation Time:  1 hour and 16 minutes The patient was continuously monitored during the procedure by the interventional radiology nurse under my direct supervision. CONTRAST:  75 mL of Omnipaque 240 milligrams/mL FLUOROSCOPY TIME:  Fluoroscopy Time: 13 minutes 36 seconds (281.6 mGy). COMPLICATIONS: None immediate. TECHNIQUE: Informed written consent was obtained from the patient and her mother after a thorough discussion of the procedural risks, benefits and alternatives. All questions were addressed. Maximal Sterile Barrier Technique was utilized including caps, mask, sterile gowns, sterile gloves, sterile drape, hand hygiene and skin antiseptic. A timeout was performed prior to the initiation of the procedure. Using the modified Seldinger technique and a micropuncture kit, access was gained to the distal right radial artery at the anatomical snuffbox and a 5 French sheath was placed. Real-time ultrasound guidance was utilized for vascular access including the acquisition of a permanent ultrasound image documenting patency of the accessed vessel. Slow intra arterial infusion of 5,000 IU heparin, 5 mg Verapamil and 975 mcg nitroglicerin diluted  in patient's own blood was performed. No significant fluctuation in patient's blood pressure seen. Then, a right radial artery roadmap was obtained  via sheath side port. Normal brachial artery branching pattern seen. No significant anatomical variation. The right radial artery caliber is adequate for vascular access. Next, a 5 [place] Simmons 2 glide catheter was navigated over a 0.035" Terumo Glidewire into the right subclavian artery under fluoroscopic guidance. Frontal right subclavian artery angiogram was obtained. Due to inability to access the aortic arch from the aberrant right subclavian artery, decision was made to proceed with femoral access. The right groin was prepped and draped in the usual sterile fashion. Using a micropuncture kit and the modified Seldinger technique, access was gained to the right common femoral artery and a 5 French sheath was placed. Under fluoroscopy, a 5 [place] Berenstein 2 catheter was navigated over a 0.035" Terumo Glidewire into the aortic arch. The catheter was placed into the right common carotid artery. Frontal and lateral angiograms of the neck were obtained. Under biplane roadmap, the catheter was advanced into the right internal carotid artery. Frontal and lateral angiograms of the head were obtained followed by right anterior oblique and magnified lateral views of the head. The catheter was then retracted into the right common carotid artery and under fluoroscopy advanced into the right external carotid artery. Frontal and lateral angiograms of the head were obtained. The catheter was then placed into the left subclavian artery and advanced into the left vertebral artery. Frontal, lateral waters and magnified lateral views of the head were obtained. The catheter was then advanced into the left common carotid artery. Frontal and lateral views of the neck were obtained. Under biplane roadmap, the catheter was advanced into the left internal carotid artery. Frontal,  lateral, magnified left anterior oblique and magnified lateral views of the head were obtained. The catheter was then retracted and advanced into the left external carotid artery. Frontal and lateral angiograms of the head were obtained. The catheter was subsequently advanced into the right vertebral artery. Frontal and lateral views of the head were obtained. The catheter was subsequently withdrawn. A 5 French Perclose was utilized for right femoral access closure. Hemostasis was achieved after approximately 5 minutes of manual pressure hold. An inflatable band was placed and inflated over the right hand access site. The vascular sheath was withdrawn and the band was slowly deflated until brisk flow was noted through the arteriotomy site. At this point, the band was reinflated with additional 2 cc of air to obtain patent hemostasis. FINDINGS: Right radial artery ultrasound and right radial artery angiogram: The caliber of the distal right radial artery is appropriate for angiogram access. The right radial artery and the right ulnar artery have normal course and caliber. No significant anatomical variants noted. Right subclavian angiograms: Aberrant right subclavian artery is noted. No opacification of the right vertebral artery. Otherwise, the visualized right subclavian artery and visualized branches of the thyrocervical trunk are unremarkable. Right common femoral artery ultrasound: Normal course and caliber of the right common femoral artery. Right CCA angiograms: Cervical angiograms show normal course and caliber of the visualized right common carotid and internal carotid arteries. There are no significant stenoses. Right ICA angiograms: There is brisk vascular contrast filling of the ACA and MCA vascular trees. Luminal caliber is smooth and tapering. No aneurysms or abnormally high-flow, early draining veins are seen. No regions of abnormal hypervascularity are noted. The visualized dural sinuses are patent.  Right ECA and right occipital angiograms: No early venous drainage was noted. The intracranial branches of the right external carotid artery are unremarkable. Left vertebral artery angiograms: The left vertebral  artery, basilar artery, and bilateral posterior cerebral arteries are unremarkable. Small infundibulum at the origin of a thalami perforator branch from the left P1/PCA is incidentally noted. Luminal caliber is smooth and tapering. No aneurysms or abnormally high-flow, early draining veins are seen. No regions of abnormal hypervascularity are noted. The visualized dural sinuses are patent. Left CCA angiograms: Cervical angiograms show normal course and caliber of the visualized left common carotid and internal carotid arteries. There are no significant stenoses. Left ICA angiograms: There is brisk vascular contrast filling of the the ACA and MCA vascular trees. Luminal caliber is smooth and tapering. No aneurysms or abnormally high-flow, early draining veins are seen. No regions of abnormal hypervascularity are noted. The visualized dural sinuses are patent. Left ECA angiograms: No early venous drainage was noted. The intracranial branches of the left external carotid artery are unremarkable. Right vertebral artery angiograms: The right vertebral artery originates from the right common carotid artery. The right vertebral artery, basilar artery, and bilateral posterior cerebral arteries are unremarkable. Luminal caliber is smooth and tapering. No aneurysms or abnormally high-flow, early draining veins are seen. No regions of abnormal hypervascularity are noted. The visualized dural sinuses are patent. PROCEDURE: Not applicable. IMPRESSION: No evidence of luminal irregularity, hemodynamically significant stenosis, occlusion or other significant vascular abnormality to explain patient's recent cerebral infarcts. No aneurysm, AVM or dural AV fistula. 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 PLAN: Results communicated to Dr. Erlinda Hong via secure text paging immediately after the angiogram was finalized. Electronically Signed   By: Pedro Earls M.D.   On: 04/13/2021 15:31   IR ANGIO VERTEBRAL SEL SUBCLAVIAN INNOMINATE BILAT MOD SED  Result Date: 04/13/2021 INDICATION: Vertis Bauder is a 20 year old female with a past medical history significant for asthma and anxiety. She presented to outside hospital on 04/11/2021 with speech difficulty. She was transferred and admitted to [facility] for stroke work-up. 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 A diagnostic cerebral angiogram was requested to evaluate cerebral vasculature. EXAM: ULTRASOUND-GUIDED VASCULAR ACCESS DIAGNOSTIC CEREBRAL ANGIOGRAM COMPARISON:  MR angiogram Apr 12, 2021 MEDICATIONS: None. ANESTHESIA/SEDATION: Versed 1 mg IV; Fentanyl 50 mcg IV Moderate Sedation Time:  1 hour and 16 minutes The patient was continuously monitored during the procedure by the interventional radiology nurse under my direct supervision. CONTRAST:  75 mL of Omnipaque 240 milligrams/mL FLUOROSCOPY TIME:  Fluoroscopy Time: 13 minutes 36 seconds (281.6 mGy). COMPLICATIONS: None immediate. TECHNIQUE: Informed written consent was obtained from the patient and her mother after a thorough discussion of the procedural risks, benefits and alternatives. All questions were addressed. Maximal Sterile Barrier Technique was utilized including caps, mask, sterile gowns, sterile gloves, sterile drape, hand hygiene and skin antiseptic. A timeout was performed prior to the initiation of the procedure. Using the modified Seldinger technique and a micropuncture kit, access was gained to the distal right radial artery at the anatomical snuffbox and a 5 French sheath was placed. Real-time ultrasound guidance was utilized for  vascular access including the acquisition of a permanent ultrasound image documenting patency of the accessed vessel. Slow intra arterial infusion of 5,000 IU heparin, 5 mg Verapamil and 856 mcg nitroglicerin diluted in patient's own blood was performed. No significant fluctuation in patient's blood pressure seen. Then, a right radial artery roadmap was obtained via sheath side port. Normal brachial artery branching pattern seen.  No significant anatomical variation. The right radial artery caliber is adequate for vascular access. Next, a 5 [place] Simmons 2 glide catheter was navigated over a 0.035" Terumo Glidewire into the right subclavian artery under fluoroscopic guidance. Frontal right subclavian artery angiogram was obtained. Due to inability to access the aortic arch from the aberrant right subclavian artery, decision was made to proceed with femoral access. The right groin was prepped and draped in the usual sterile fashion. Using a micropuncture kit and the modified Seldinger technique, access was gained to the right common femoral artery and a 5 French sheath was placed. Under fluoroscopy, a 5 [place] Berenstein 2 catheter was navigated over a 0.035" Terumo Glidewire into the aortic arch. The catheter was placed into the right common carotid artery. Frontal and lateral angiograms of the neck were obtained. Under biplane roadmap, the catheter was advanced into the right internal carotid artery. Frontal and lateral angiograms of the head were obtained followed by right anterior oblique and magnified lateral views of the head. The catheter was then retracted into the right common carotid artery and under fluoroscopy advanced into the right external carotid artery. Frontal and lateral angiograms of the head were obtained. The catheter was then placed into the left subclavian artery and advanced into the left vertebral artery. Frontal, lateral waters and magnified lateral views of the head were obtained. The  catheter was then advanced into the left common carotid artery. Frontal and lateral views of the neck were obtained. Under biplane roadmap, the catheter was advanced into the left internal carotid artery. Frontal, lateral, magnified left anterior oblique and magnified lateral views of the head were obtained. The catheter was then retracted and advanced into the left external carotid artery. Frontal and lateral angiograms of the head were obtained. The catheter was subsequently advanced into the right vertebral artery. Frontal and lateral views of the head were obtained. The catheter was subsequently withdrawn. A 5 French Perclose was utilized for right femoral access closure. Hemostasis was achieved after approximately 5 minutes of manual pressure hold. An inflatable band was placed and inflated over the right hand access site. The vascular sheath was withdrawn and the band was slowly deflated until brisk flow was noted through the arteriotomy site. At this point, the band was reinflated with additional 2 cc of air to obtain patent hemostasis. FINDINGS: Right radial artery ultrasound and right radial artery angiogram: The caliber of the distal right radial artery is appropriate for angiogram access. The right radial artery and the right ulnar artery have normal course and caliber. No significant anatomical variants noted. Right subclavian angiograms: Aberrant right subclavian artery is noted. No opacification of the right vertebral artery. Otherwise, the visualized right subclavian artery and visualized branches of the thyrocervical trunk are unremarkable. Right common femoral artery ultrasound: Normal course and caliber of the right common femoral artery. Right CCA angiograms: Cervical angiograms show normal course and caliber of the visualized right common carotid and internal carotid arteries. There are no significant stenoses. Right ICA angiograms: There is brisk vascular contrast filling of the ACA and MCA  vascular trees. Luminal caliber is smooth and tapering. No aneurysms or abnormally high-flow, early draining veins are seen. No regions of abnormal hypervascularity are noted. The visualized dural sinuses are patent. Right ECA and right occipital angiograms: No early venous drainage was noted. The intracranial branches of the right external carotid artery are unremarkable. Left vertebral artery angiograms: The left vertebral artery, basilar artery, and bilateral posterior cerebral arteries are unremarkable.  Small infundibulum at the origin of a thalami perforator branch from the left P1/PCA is incidentally noted. Luminal caliber is smooth and tapering. No aneurysms or abnormally high-flow, early draining veins are seen. No regions of abnormal hypervascularity are noted. The visualized dural sinuses are patent. Left CCA angiograms: Cervical angiograms show normal course and caliber of the visualized left common carotid and internal carotid arteries. There are no significant stenoses. Left ICA angiograms: There is brisk vascular contrast filling of the the ACA and MCA vascular trees. Luminal caliber is smooth and tapering. No aneurysms or abnormally high-flow, early draining veins are seen. No regions of abnormal hypervascularity are noted. The visualized dural sinuses are patent. Left ECA angiograms: No early venous drainage was noted. The intracranial branches of the left external carotid artery are unremarkable. Right vertebral artery angiograms: The right vertebral artery originates from the right common carotid artery. The right vertebral artery, basilar artery, and bilateral posterior cerebral arteries are unremarkable. Luminal caliber is smooth and tapering. No aneurysms or abnormally high-flow, early draining veins are seen. No regions of abnormal hypervascularity are noted. The visualized dural sinuses are patent. PROCEDURE: Not applicable. IMPRESSION: No evidence of luminal irregularity, hemodynamically  significant stenosis, occlusion or other significant vascular abnormality to explain patient's recent cerebral infarcts. No aneurysm, AVM or dural AV fistula. 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 PLAN: Results communicated to Dr. Erlinda Hong via secure text paging immediately after the angiogram was finalized. Electronically Signed   By: Pedro Earls M.D.   On: 04/13/2021 15:31   IR ANGIO EXTERNAL CAROTID SEL EXT CAROTID BILAT MOD SED  Result Date: 04/13/2021 INDICATION: Diva Lemberger is a 20 year old female with a past medical history significant for asthma and anxiety. She presented to outside hospital on 04/11/2021 with speech difficulty. She was transferred and admitted to [facility] for stroke work-up. 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 A diagnostic cerebral angiogram was requested to evaluate cerebral vasculature. EXAM: ULTRASOUND-GUIDED VASCULAR ACCESS DIAGNOSTIC CEREBRAL ANGIOGRAM COMPARISON:  MR angiogram Apr 12, 2021 MEDICATIONS: None. ANESTHESIA/SEDATION: Versed 1 mg IV; Fentanyl 50 mcg IV Moderate Sedation Time:  1 hour and 16 minutes The patient was continuously monitored during the procedure by the interventional radiology nurse under my direct supervision. CONTRAST:  75 mL of Omnipaque 240 milligrams/mL FLUOROSCOPY TIME:  Fluoroscopy Time: 13 minutes 36 seconds (281.6 mGy). COMPLICATIONS: None immediate. TECHNIQUE: Informed written consent was obtained from the patient and her mother after a thorough discussion of the procedural risks, benefits and alternatives. All questions were addressed. Maximal Sterile Barrier Technique was utilized including caps, mask, sterile gowns, sterile gloves, sterile drape, hand hygiene and skin antiseptic. A timeout was performed prior to the initiation of the procedure. Using  the modified Seldinger technique and a micropuncture kit, access was gained to the distal right radial artery at the anatomical snuffbox and a 5 French sheath was placed. Real-time ultrasound guidance was utilized for vascular access including the acquisition of a permanent ultrasound image documenting patency of the accessed vessel. Slow intra arterial infusion of 5,000 IU heparin, 5 mg Verapamil and 242 mcg nitroglicerin diluted in patient's own blood was performed. No significant fluctuation in patient's blood pressure seen. Then, a right radial artery roadmap was obtained via sheath side port. Normal brachial artery branching pattern seen. No significant anatomical variation. The right radial artery caliber  is adequate for vascular access. Next, a 5 [place] Simmons 2 glide catheter was navigated over a 0.035" Terumo Glidewire into the right subclavian artery under fluoroscopic guidance. Frontal right subclavian artery angiogram was obtained. Due to inability to access the aortic arch from the aberrant right subclavian artery, decision was made to proceed with femoral access. The right groin was prepped and draped in the usual sterile fashion. Using a micropuncture kit and the modified Seldinger technique, access was gained to the right common femoral artery and a 5 French sheath was placed. Under fluoroscopy, a 5 [place] Berenstein 2 catheter was navigated over a 0.035" Terumo Glidewire into the aortic arch. The catheter was placed into the right common carotid artery. Frontal and lateral angiograms of the neck were obtained. Under biplane roadmap, the catheter was advanced into the right internal carotid artery. Frontal and lateral angiograms of the head were obtained followed by right anterior oblique and magnified lateral views of the head. The catheter was then retracted into the right common carotid artery and under fluoroscopy advanced into the right external carotid artery. Frontal and lateral angiograms of  the head were obtained. The catheter was then placed into the left subclavian artery and advanced into the left vertebral artery. Frontal, lateral waters and magnified lateral views of the head were obtained. The catheter was then advanced into the left common carotid artery. Frontal and lateral views of the neck were obtained. Under biplane roadmap, the catheter was advanced into the left internal carotid artery. Frontal, lateral, magnified left anterior oblique and magnified lateral views of the head were obtained. The catheter was then retracted and advanced into the left external carotid artery. Frontal and lateral angiograms of the head were obtained. The catheter was subsequently advanced into the right vertebral artery. Frontal and lateral views of the head were obtained. The catheter was subsequently withdrawn. A 5 French Perclose was utilized for right femoral access closure. Hemostasis was achieved after approximately 5 minutes of manual pressure hold. An inflatable band was placed and inflated over the right hand access site. The vascular sheath was withdrawn and the band was slowly deflated until brisk flow was noted through the arteriotomy site. At this point, the band was reinflated with additional 2 cc of air to obtain patent hemostasis. FINDINGS: Right radial artery ultrasound and right radial artery angiogram: The caliber of the distal right radial artery is appropriate for angiogram access. The right radial artery and the right ulnar artery have normal course and caliber. No significant anatomical variants noted. Right subclavian angiograms: Aberrant right subclavian artery is noted. No opacification of the right vertebral artery. Otherwise, the visualized right subclavian artery and visualized branches of the thyrocervical trunk are unremarkable. Right common femoral artery ultrasound: Normal course and caliber of the right common femoral artery. Right CCA angiograms: Cervical angiograms show  normal course and caliber of the visualized right common carotid and internal carotid arteries. There are no significant stenoses. Right ICA angiograms: There is brisk vascular contrast filling of the ACA and MCA vascular trees. Luminal caliber is smooth and tapering. No aneurysms or abnormally high-flow, early draining veins are seen. No regions of abnormal hypervascularity are noted. The visualized dural sinuses are patent. Right ECA and right occipital angiograms: No early venous drainage was noted. The intracranial branches of the right external carotid artery are unremarkable. Left vertebral artery angiograms: The left vertebral artery, basilar artery, and bilateral posterior cerebral arteries are unremarkable. Small infundibulum at the origin of a thalami perforator  branch from the left P1/PCA is incidentally noted. Luminal caliber is smooth and tapering. No aneurysms or abnormally high-flow, early draining veins are seen. No regions of abnormal hypervascularity are noted. The visualized dural sinuses are patent. Left CCA angiograms: Cervical angiograms show normal course and caliber of the visualized left common carotid and internal carotid arteries. There are no significant stenoses. Left ICA angiograms: There is brisk vascular contrast filling of the the ACA and MCA vascular trees. Luminal caliber is smooth and tapering. No aneurysms or abnormally high-flow, early draining veins are seen. No regions of abnormal hypervascularity are noted. The visualized dural sinuses are patent. Left ECA angiograms: No early venous drainage was noted. The intracranial branches of the left external carotid artery are unremarkable. Right vertebral artery angiograms: The right vertebral artery originates from the right common carotid artery. The right vertebral artery, basilar artery, and bilateral posterior cerebral arteries are unremarkable. Luminal caliber is smooth and tapering. No aneurysms or abnormally high-flow, early  draining veins are seen. No regions of abnormal hypervascularity are noted. The visualized dural sinuses are patent. PROCEDURE: Not applicable. IMPRESSION: No evidence of luminal irregularity, hemodynamically significant stenosis, occlusion or other significant vascular abnormality to explain patient's recent cerebral infarcts. No aneurysm, AVM or dural AV fistula. 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 PLAN: Results communicated to Dr. Erlinda Hong via secure text paging immediately after the angiogram was finalized. Electronically Signed   By: Pedro Earls M.D.   On: 04/13/2021 15:31    PHYSICAL EXAM  Temp:  [97 F (36.1 C)-98.1 F (36.7 C)] 97 F (36.1 C) (06/01 1405) Pulse Rate:  [54-85] 54 (06/01 1425) Resp:  [12-18] 15 (06/01 1425) BP: (116-165)/(58-98) 116/61 (06/01 1425) SpO2:  [98 %-100 %] 99 % (06/01 1425)  General - Well nourished, well developed, in no apparent distress.  Ophthalmologic - fundi not visualized due to noncooperation.  Cardiovascular - Regular rhythm and rate.  Mental Status -  Level of arousal and orientation to time, place, and person were intact. Language including naming, comprehension was assessed and found intact.  However, patient has difficulty repeating complex sentences, intermittent word finding difficulty. Fund of Knowledge was assessed and was intact.  Cranial Nerves II - XII - II - Visual field intact OU. III, IV, VI - Extraocular movements intact. V - Facial sensation intact bilaterally. VII - Facial movement intact bilaterally. VIII - Hearing & vestibular intact bilaterally. X - Palate elevates symmetrically. XI - Chin turning & shoulder shrug intact bilaterally. XII - Tongue protrusion intact.  Motor Strength - The patient's strength was normal in all extremities and pronator drift was absent.  Bulk was normal and fasciculations were  absent.   Motor Tone - Muscle tone was assessed at the neck and appendages and was normal.  Reflexes - The patient's reflexes were symmetrical in all extremities and she had no pathological reflexes.  Sensory - Light touch, temperature/pinprick were assessed and were symmetrical.    Coordination - The patient had normal movements in the hands and feet with no ataxia or dysmetria.  Tremor was absent.  Gait and Station - deferred.   ASSESSMENT/PLAN Ms. Donia Yokum is a 20 y.o. female with history of OCP use, intermittent smoker and THC user admitted for expressive aphasia and headache. No tPA given due to outside window.    Stroke: Multifocal infarct, embolic pattern, secondary to unclear source, ? RCVS    Resultant  mild expressive aphasia  CT no acute abnormality  MRI left MCA moderate sized infarct, right PCA, right cerebellum and right frontal punctate infarcts  MRA subtle diffuse small vessel irregularity in the intracranial circulation  Cerebral angiogram unremarkable, not consistent with vasculitis  CSF unremarkable, not consistent with vasculitis  2D Echo EF 60 to 65%, no PFO seen  LE venous Doppler negative for DVT  TEE no PFO, no cardioembolic source  LDL 89  [id number] pending  Hypercoagulable and autoimmune work-up negative, others still pending  SCDs for VTE prophylaxis  No antithrombotic prior to admission, now on aspirin 81 mg daily and plavix 75 mg daily for 3 weeks and then aspirin alone  Patient counseled to be compliant with her antithrombotic medications  Ongoing aggressive stroke risk factor management  Therapy recommendations: None  Disposition: Pending  Hyperlipidemia  Home meds: None  LDL 89, goal < 70  Now on Lipitor 20, no high intensity statin due to childbearing age and relatively LDL close to goal  Avoid pregnancy while on statin - pt and parents aware  Continue statin at discharge  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 Please call with questions. Pt will follow up with stroke clinic Dr. Leonie Man at [facility] in about 4 weeks. Thanks for the consult.   Rosalin Hawking, MD PhD Stroke Neurology 04/14/2021 3:45 PM    To contact Stroke Continuity provider, please refer to [email or web address] After hours, contact General Neurology

## 2021-04-14 NOTE — Anesthesia Postprocedure Evaluation (Signed)
Anesthesia Post Note  Patient: Matasha Smigelski  Procedure(s) Performed: TRANSESOPHAGEAL ECHOCARDIOGRAM (TEE) (N/A ) BUBBLE STUDY     Patient location during evaluation: Endoscopy Anesthesia Type: MAC Level of consciousness: awake Pain management: pain level controlled Vital Signs Assessment: post-procedure vital signs reviewed and stable Respiratory status: spontaneous breathing, nonlabored ventilation, respiratory function stable and patient connected to nasal cannula oxygen Cardiovascular status: stable and blood pressure returned to baseline Postop Assessment: no apparent nausea or vomiting Anesthetic complications: no   No complications documented.  Last Vitals:  Vitals:   04/14/21 1415 04/14/21 1425  BP: (!) 116/58 116/61  Pulse: (!) 58 (!) 54  Resp: 15 15  Temp:    SpO2: 99% 99%    Last Pain:  Vitals:   04/14/21 1425  TempSrc:   PainSc: 0-No pain                 Peace Jost P Yezenia Fredrick

## 2021-04-14 NOTE — TOC Transition Note (Signed)
Transition of Care [facility]) - CM/SW Discharge Note   Patient Details  Name: Meghan Williamson MRN: [id number] Date of Birth: [birth year]  [facility] CM/SW Contact:  Kermit Balo, RN Phone Number: 04/14/2021, 3:35 PM   Clinical Narrative:    Patient set up with outpatient ST at [facility]. Pt has needed supervision at home and transportation to home.  PCP: Dr Kateri Plummer at [facility]   Final next level of care: OP Rehab Barriers to Discharge: No Barriers Identified   Patient Goals and CMS Choice        Discharge Placement                       Discharge Plan and Services                                     Social Determinants of Health (SDOH) Interventions     Readmission Risk Interventions No flowsheet data found.

## 2021-04-14 NOTE — Plan of Care (Signed)
  Problem: Education: Goal: Knowledge of General Education information will improve Description: Including pain rating scale, medication(s)/side effects and non-pharmacologic comfort measures Outcome: Progressing   Problem: Health Behavior/Discharge Planning: Goal: Ability to manage health-related needs will improve Outcome: Progressing   Problem: Clinical Measurements: Goal: Ability to maintain clinical measurements within normal limits will improve Outcome: Progressing Goal: Will remain free from infection Outcome: Progressing Goal: Diagnostic test results will improve Outcome: Progressing Goal: Respiratory complications will improve Outcome: Progressing Goal: Cardiovascular complication will be avoided Outcome: Progressing   Problem: Activity: Goal: Risk for activity intolerance will decrease Outcome: Progressing   Problem: Nutrition: Goal: Adequate nutrition will be maintained Outcome: Progressing   Problem: Coping: Goal: Level of anxiety will decrease Outcome: Progressing   Problem: Elimination: Goal: Will not experience complications related to bowel motility Outcome: Progressing Goal: Will not experience complications related to urinary retention Outcome: Progressing   Problem: Pain Managment: Goal: General experience of comfort will improve Outcome: Progressing   Problem: Safety: Goal: Ability to remain free from injury will improve Outcome: Progressing   Problem: Skin Integrity: Goal: Risk for impaired skin integrity will decrease Outcome: Progressing   Problem: Education: Goal: Knowledge of disease or condition will improve Outcome: Progressing   Problem: Self-Care: Goal: Ability to participate in self-care as condition permits will improve Outcome: Progressing   

## 2021-04-14 NOTE — Discharge Summary (Signed)
Physician Discharge Summary  Meghan Williamson ZOX:[id number] DOB: July 14, 2001 DOA: 04/11/2021  PCP: Pcp, No  Admit date: 04/11/2021 Discharge date: 04/14/2021  Admitted From: Home  Discharge disposition: Home   Recommendations for Outpatient Follow-Up:   . Follow-up with [facility] as scheduled by the clinic. Marland Kitchen Hypercoagulable work-up has been sent this will need to be followed up as outpatient.  Discharge Diagnosis:   Principal Problem:   Dysarthria Active Problems:   Memory difficulties   Acute CVA (cerebrovascular accident) [facility])   Discharge Condition: Improved.  Diet recommendation:   Regular.  Wound care: None.  Code status: Full.   History of Present Illness:   Meghan Williamson a 20 y.o.femalewithmedical history of intermittent seasonal allergies inducing asthma but no current symptoms presented to the hospital with difficulty speech and memory while she was driving home.  She initially developed some headache that was throbbing without any aura.  She then started having difficulty speaking and finding words and was typing jumbled letters. She also reported some memory lapse and difficulty remembering things so she presented to the hospital.  Patient is currently on oral contraceptives to regulate her menstrual periods and occasionally smokes marijuana.  She never had any similar symptoms in the past.  MRI did show CVA.  Patient was then admitted hospital for further evaluation and treatment.    Hospital Course:   Following conditions were addressed during hospitalization as listed below,  Acute multifocal CVA CT head scan was unremarkable.  MRI of the brain showed acute ischemic infarct involving the left temporal occipital and parietal regions with possible minimal petechial hemorrhage.  Also involved was right frontal lobe, right basal ganglia and right temporal, occipital and cerebellar region.  Some remote lacunar infarcts were noted on the left  thalamus and cerebellum as well. MRA head: Question subtle diffuse small vessel irregularity about the intracranial circulation, considerations for early atherosclerotic disease versus changes in vasculopathy.  Otherwise normal MRA, no large vessel occlusion.  MRA of the neck was unremarkable.  LDL was 89. MRA neck was unremarkable.  Hemoglobin A1c still pending. 2D echocardiogram showed LV ejection fraction of 60 to 65% with no regional wall motion abnormality.  TEE was advised and patient underwent TEE on 04/14/2021 with normal valvular abnormalities to suggest embolic phenomena.   Bubble study unremarkable, no evidence of an intra-arterial shunt. EEG was suggestive of cortical dysfunction left hemisphere, maximal left temporal region likely due to underlying stroke.  No seizures or epileptiform discharges seen.  Patient also had diagnostic cerebral angiogram which did not show any evidence of intracranial occlusion, hemodynamically significant stenosis or aneurysm AVM.  Hypercoagulable work-up has been sent which is pending.  She has been seen by PT OT and speech therapy and does not require any specialized needs but outpatient speech therapy..  Patient is also status post LP.  Lower extremity duplex ultrasound was negative for DVT.  Of note patient is on oral contraceptive started 1 year back and had COVID in November 2020.  Urine drug screen was positive for THC as well.  Continue aspirin and Lipitor Plavix at this time.  Plan is to continue dual antiplatelets for 3 weeks followed by aspirin alone.  Continue oral contraceptive on discharge.  Vitamin B12 deficiency Vitamin B12 was 88.  Homocystine level was normal.  Initially received IM vitamin B12.  Spoke with neurology.  Will discontinue vitamin B12 supplement on discharge.  Menstrual irregularities  Patient was prescribed OCPs 1 year ago.  Would avoid OCPs  in the context of CVA.  Continue on discharge.  Spoke with the patient's mom  Disposition.   At this time, patient is stable for disposition home with outpatient follow-up with [facility].  Spoke with the patient's parents prior to discharge.  Medical Consultants:    Neurology.  Interventional radiology  Procedures:    EEG  2D echocardiogram Cerebral angiogram Lumbar puncture Transesophageal echocardiogram  Subjective:   Today, patient was seen and examined at bedside.Patient denies any dizziness lightheadedness headache chest pain shortness of breath.  Discharge Exam:   Vitals:   04/14/21 1415 04/14/21 1425  BP: (!) 116/58 116/61  Pulse: (!) 58 (!) 54  Resp: 15 15  Temp:    SpO2: 99% 99%   Vitals:   04/14/21 1305 04/14/21 1405 04/14/21 1415 04/14/21 1425  BP: (!) 165/91 116/61 (!) 116/58 116/61  Pulse: (!) 58 63 (!) 58 (!) 54  Resp: Temp: 98.1 F (36.7 C) (!) 97 F (36.1 C)    TempSrc:  Temporal    SpO2: 98% 100% 99% 99%  Weight:      Height:        General: Alert awake, not in obvious distress HENT: pupils equally reacting to light,  No scleral pallor or icterus noted. Oral mucosa is moist.  Chest:  Clear breath sounds.  Diminished breath sounds bilaterally. No crackles or wheezes.  CVS: S1 &S2 heard. No murmur.  Regular rate and rhythm. Abdomen: Soft, nontender, nondistended.  Bowel sounds are heard.   Extremities: No cyanosis, clubbing or edema.  Peripheral pulses are palpable. Psych: Alert, awake and oriented, normal mood CNS:  No cranial nerve deficits.  Power equal in all extremities.   Skin: Warm and dry.  No rashes noted.  The results of significant diagnostics from this hospitalization (including imaging, microbiology, ancillary and laboratory) are listed below for reference.     Diagnostic Studies:   CT HEAD WO CONTRAST  Result Date: 04/11/2021 CLINICAL DATA:  Pt arrives to ED with c/o an expressive aphasic episode today starting at 1pm where she could not speak words. Pt states this episode may of  lasted a few minutes. Pt reports she started a new job and it requires he to talk to a lot of new people. Pt does reports headache before the event and headache yesterday after twist her neck and hearing a "poping" noise EXAM: CT HEAD WITHOUT CONTRAST TECHNIQUE: Contiguous axial images were obtained from the base of the skull through the vertex without intravenous contrast. COMPARISON:  None. FINDINGS: Brain: No evidence of acute infarction, hemorrhage, hydrocephalus, extra-axial collection or mass lesion/mass effect. Vascular: No hyperdense vessel or unexpected calcification. Skull: Normal. Negative for fracture or focal lesion. Sinuses/Orbits: Visualized globes and orbits are unremarkable. Visualized sinuses are clear. Other: None. IMPRESSION: Normal unenhanced CT scan of the brain. Electronically Signed   By: Amie Portland M.D.   On: 04/11/2021 14:49   MR ANGIO HEAD WO CONTRAST  Result Date: 04/12/2021 CLINICAL DATA:  Initial evaluation for acute dysarthria. EXAM: MRI HEAD WITHOUT CONTRAST MRA HEAD WITHOUT CONTRAST MRA NECK WITHOUT AND WITH CONTRAST TECHNIQUE: Multiplanar, multi-echo pulse sequences of the brain and surrounding structures were acquired without intravenous contrast. Angiographic images of the Circle of Willis were acquired using MRA technique without intravenous contrast. Angiographic images of the neck were acquired using MRA technique without and with intravenous contrast. Carotid stenosis measurements (when applicable) are obtained utilizing NASCET criteria, using the distal internal carotid diameter as  the denominator. CONTRAST:  23mL GADAVIST GADOBUTROL 1 MMOL/ML IV SOLN COMPARISON:  Prior head CT from 04/11/2021. FINDINGS: MRI HEAD FINDINGS Brain: Cerebral volume within normal limits. Few scattered foci of patchy T2/FLAIR signal abnormality seen involving the periventricular and deep white matter both cerebral hemispheres, nonspecific, but suspected to reflect chronic microvascular  ischemic disease, definitely advanced for age. Remote lacunar infarct present at the ventral medial left thalamus. A few tiny remote bilateral cerebellar infarcts noted. Patchy and confluent restricted diffusion involving primarily the cortex of the left temporal occipital and parietal region, consistent with acute ischemic infarct. A few suspected subtle foci of susceptibility artifact within the area of infarction, suggestive of minimal petechial hemorrhage. No frank hemorrhagic transformation or mass effect. There are additional scattered subcentimeter foci of restricted diffusion involving the right basal ganglia (series 5, images 79, 78). Patchy right PCA distribution infarcts involving the right occipital lobe and right hippocampus (series 5, images 71, 70). Few punctate bilateral cerebellar infarcts (series 5, images 60, 66 additional punctate right frontal cortical infarct (series 5, image 88). No other associated hemorrhage or mass effect. No mass lesion or midline shift. No hydrocephalus or extra-axial fluid collection. Pituitary gland within normal limits for age. Midline structures intact. Vascular: Major intracranial vascular flow voids are maintained. Skull and upper cervical spine: Craniocervical junction within normal limits. Bone marrow signal intensity normal. No scalp soft tissue abnormality. Sinuses/Orbits: Globes and orbital soft tissues within normal limits. Paranasal sinuses are clear. No significant mastoid effusion. Inner ear structures grossly normal. Other: None. MRA HEAD FINDINGS ANTERIOR CIRCULATION: Visualized distal cervical segments of the internal carotid arteries are patent with antegrade flow. Petrous, cavernous, and supraclinoid segments patent without stenosis or other abnormality. A1 segments patent. Question 2 mm focal outpouching extending inferiorly from the anterior communicating artery complex (series 1037, image 8). Anterior cerebral arteries patent to their distal aspects  without stenosis. No M1 stenosis or occlusion. Normal MCA bifurcations. MCA branches well perfused and symmetric. There is question of diffuse small vessel irregularity about the MCA branches bilaterally, best seen at the proximal left M2 anterior division (series 1037, image 8). POSTERIOR CIRCULATION: Both vertebral arteries patent to the vertebrobasilar junction without stenosis. Left vertebral artery slightly dominant. Both PICA origins patent and normal. Basilar patent to its distal aspect without stenosis. Superior cerebellar arteries patent bilaterally. Both PCAs supplied via the basilar as well as small bilateral posterior communicating arteries. PCAs well perfused to their distal aspects without proximal stenosis. Again there is question of possible subtle distal small vessel irregularity. No intracranial aneurysm or other vascular abnormality. MRA NECK FINDINGS AORTIC ARCH: Visualized aortic arch normal in caliber. Aberrant right subclavian artery noted. Right CCA arises directly from the aortic arch. No significant vascular irregularity or stenosis about the origin of the great vessels. RIGHT CAROTID SYSTEM: Right common and internal carotid arteries widely patent without stenosis, evidence for dissection, or occlusion. LEFT CAROTID SYSTEM: Left common and internal carotid arteries widely patent without stenosis, evidence for dissection, or occlusion. VERTEBRAL ARTERIES: Both vertebral arteries arise from the subclavian arteries. Left vertebral artery dominant. Vertebral arteries patent without stenosis, evidence for dissection, or occlusion. IMPRESSION: MRI HEAD: 1. 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 Possible minimal petechial hemorrhage without hemorrhagic transformation or mass effect. 2. Additional multifocal acute ischemic infarcts involving the right frontal lobe, right basal ganglia, right temporoccipital region, and cerebellum as above. No  other associated hemorrhage or mass effect. 3. Underlying remote lacunar infarcts  involving the left thalamus and cerebellum. 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 Changes are certainly advanced for patient age. Possible vasculitis would be the primary differential consideration given patient age and the presence of multiple acute and chronic infarcts. MRA HEAD: 1. Question subtle diffuse small vessel irregularity about the intracranial circulation. Differential considerations include early atherosclerotic disease versus changes of vasculopathy. 2. Otherwise normal MRA appearance of the medium and large intracranial arterial vessels. No large vessel occlusion. No hemodynamically significant or correctable stenosis. 3. Question 2 mm focal outpouching arising from the anterior communicating artery complex, which could reflect focal vascular tortuosity versus a small aneurysm. Attention at follow-up recommended. MRA NECK: 1. Normal MRA of the neck. No hemodynamically significant stenosis or other acute vascular abnormality. 2. Aberrant right subclavian artery. Electronically Signed   By: Rise Mu M.D.   On: 04/12/2021 05:48   MR ANGIO NECK W WO CONTRAST  Result Date: 04/12/2021 CLINICAL DATA:  Initial evaluation for acute dysarthria. EXAM: MRI HEAD WITHOUT CONTRAST MRA HEAD WITHOUT CONTRAST MRA NECK WITHOUT AND WITH CONTRAST TECHNIQUE: Multiplanar, multi-echo pulse sequences of the brain and surrounding structures were acquired without intravenous contrast. Angiographic images of the Circle of Willis were acquired using MRA technique without intravenous contrast. Angiographic images of the neck were acquired using MRA technique without and with intravenous contrast. Carotid stenosis measurements (when applicable) are obtained utilizing NASCET criteria, using the  distal internal carotid diameter as the denominator. CONTRAST:  46mL GADAVIST GADOBUTROL 1 MMOL/ML IV SOLN COMPARISON:  Prior head CT from 04/11/2021. FINDINGS: MRI HEAD FINDINGS Brain: Cerebral volume within normal limits. Few scattered foci of patchy T2/FLAIR signal abnormality seen involving the periventricular and deep white matter both cerebral hemispheres, nonspecific, but suspected to reflect chronic microvascular ischemic disease, definitely advanced for age. Remote lacunar infarct present at the ventral medial left thalamus. A few tiny remote bilateral cerebellar infarcts noted. Patchy and confluent restricted diffusion involving primarily the cortex of the left temporal occipital and parietal region, consistent with acute ischemic infarct. A few suspected subtle foci of susceptibility artifact within the area of infarction, suggestive of minimal petechial hemorrhage. No frank hemorrhagic transformation or mass effect. There are additional scattered subcentimeter foci of restricted diffusion involving the right basal ganglia (series 5, images 79, 78). Patchy right PCA distribution infarcts involving the right occipital lobe and right hippocampus (series 5, images 71, 70). Few punctate bilateral cerebellar infarcts (series 5, images 60, 66 additional punctate right frontal cortical infarct (series 5, image 88). No other associated hemorrhage or mass effect. No mass lesion or midline shift. No hydrocephalus or extra-axial fluid collection. Pituitary gland within normal limits for age. Midline structures intact. Vascular: Major intracranial vascular flow voids are maintained. Skull and upper cervical spine: Craniocervical junction within normal limits. Bone marrow signal intensity normal. No scalp soft tissue abnormality. Sinuses/Orbits: Globes and orbital soft tissues within normal limits. Paranasal sinuses are clear. No significant mastoid effusion. Inner ear structures grossly normal. Other: None. MRA HEAD  FINDINGS ANTERIOR CIRCULATION: Visualized distal cervical segments of the internal carotid arteries are patent with antegrade flow. Petrous, cavernous, and supraclinoid segments patent without stenosis or other abnormality. A1 segments patent. Question 2 mm focal outpouching extending inferiorly from the anterior communicating artery complex (series 1037, image 8). Anterior cerebral arteries patent to their distal aspects without stenosis. No M1 stenosis or occlusion. Normal MCA bifurcations. MCA branches well perfused and symmetric.  There is question of diffuse small vessel irregularity about the MCA branches bilaterally, best seen at the proximal left M2 anterior division (series 1037, image 8). POSTERIOR CIRCULATION: Both vertebral arteries patent to the vertebrobasilar junction without stenosis. Left vertebral artery slightly dominant. Both PICA origins patent and normal. Basilar patent to its distal aspect without stenosis. Superior cerebellar arteries patent bilaterally. Both PCAs supplied via the basilar as well as small bilateral posterior communicating arteries. PCAs well perfused to their distal aspects without proximal stenosis. Again there is question of possible subtle distal small vessel irregularity. No intracranial aneurysm or other vascular abnormality. MRA NECK FINDINGS AORTIC ARCH: Visualized aortic arch normal in caliber. Aberrant right subclavian artery noted. Right CCA arises directly from the aortic arch. No significant vascular irregularity or stenosis about the origin of the great vessels. RIGHT CAROTID SYSTEM: Right common and internal carotid arteries widely patent without stenosis, evidence for dissection, or occlusion. LEFT CAROTID SYSTEM: Left common and internal carotid arteries widely patent without stenosis, evidence for dissection, or occlusion. VERTEBRAL ARTERIES: Both vertebral arteries arise from the subclavian arteries. Left vertebral artery dominant. Vertebral arteries patent  without stenosis, evidence for dissection, or occlusion. IMPRESSION: MRI HEAD: 1. 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 Possible minimal petechial hemorrhage without hemorrhagic transformation or mass effect. 2. Additional multifocal acute ischemic infarcts involving the right frontal lobe, right basal ganglia, right temporoccipital region, and cerebellum as above. No other associated hemorrhage or mass effect. 3. Underlying remote lacunar infarcts involving the left thalamus and cerebellum. 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 Changes are certainly advanced for patient age. Possible vasculitis would be the primary differential consideration given patient age and the presence of multiple acute and chronic infarcts. MRA HEAD: 1. Question subtle diffuse small vessel irregularity about the intracranial circulation. Differential considerations include early atherosclerotic disease versus changes of vasculopathy. 2. Otherwise normal MRA appearance of the medium and large intracranial arterial vessels. No large vessel occlusion. No hemodynamically significant or correctable stenosis. 3. Question 2 mm focal outpouching arising from the anterior communicating artery complex, which could reflect focal vascular tortuosity versus a small aneurysm. Attention at follow-up recommended. MRA NECK: 1. Normal MRA of the neck. No hemodynamically significant stenosis or other acute vascular abnormality. 2. Aberrant right subclavian artery. Electronically Signed   By: Rise Mu M.D.   On: 04/12/2021 05:48   MR BRAIN WO CONTRAST  Result Date: 04/12/2021 CLINICAL DATA:  Initial evaluation for acute dysarthria. EXAM: MRI HEAD WITHOUT CONTRAST MRA HEAD WITHOUT CONTRAST MRA NECK WITHOUT AND WITH CONTRAST TECHNIQUE: Multiplanar, multi-echo  pulse sequences of the brain and surrounding structures were acquired without intravenous contrast. Angiographic images of the Circle of Willis were acquired using MRA technique without intravenous contrast. Angiographic images of the neck were acquired using MRA technique without and with intravenous contrast. Carotid stenosis measurements (when applicable) are obtained utilizing NASCET criteria, using the distal internal carotid diameter as the denominator. CONTRAST:  6mL GADAVIST GADOBUTROL 1 MMOL/ML IV SOLN COMPARISON:  Prior head CT from 04/11/2021. FINDINGS: MRI HEAD FINDINGS Brain: Cerebral volume within normal limits. Few scattered foci of patchy T2/FLAIR signal abnormality seen involving the periventricular and deep white matter both cerebral hemispheres, nonspecific, but suspected to reflect chronic microvascular ischemic disease, definitely advanced for age. Remote lacunar infarct present at the ventral medial left thalamus. A few tiny remote bilateral cerebellar infarcts noted. Patchy  and confluent restricted diffusion involving primarily the cortex of the left temporal occipital and parietal region, consistent with acute ischemic infarct. A few suspected subtle foci of susceptibility artifact within the area of infarction, suggestive of minimal petechial hemorrhage. No frank hemorrhagic transformation or mass effect. There are additional scattered subcentimeter foci of restricted diffusion involving the right basal ganglia (series 5, images 79, 78). Patchy right PCA distribution infarcts involving the right occipital lobe and right hippocampus (series 5, images 71, 70). Few punctate bilateral cerebellar infarcts (series 5, images 60, 66 additional punctate right frontal cortical infarct (series 5, image 88). No other associated hemorrhage or mass effect. No mass lesion or midline shift. No hydrocephalus or extra-axial fluid collection. Pituitary gland within normal limits for age. Midline structures  intact. Vascular: Major intracranial vascular flow voids are maintained. Skull and upper cervical spine: Craniocervical junction within normal limits. Bone marrow signal intensity normal. No scalp soft tissue abnormality. Sinuses/Orbits: Globes and orbital soft tissues within normal limits. Paranasal sinuses are clear. No significant mastoid effusion. Inner ear structures grossly normal. Other: None. MRA HEAD FINDINGS ANTERIOR CIRCULATION: Visualized distal cervical segments of the internal carotid arteries are patent with antegrade flow. Petrous, cavernous, and supraclinoid segments patent without stenosis or other abnormality. A1 segments patent. Question 2 mm focal outpouching extending inferiorly from the anterior communicating artery complex (series 1037, image 8). Anterior cerebral arteries patent to their distal aspects without stenosis. No M1 stenosis or occlusion. Normal MCA bifurcations. MCA branches well perfused and symmetric. There is question of diffuse small vessel irregularity about the MCA branches bilaterally, best seen at the proximal left M2 anterior division (series 1037, image 8). POSTERIOR CIRCULATION: Both vertebral arteries patent to the vertebrobasilar junction without stenosis. Left vertebral artery slightly dominant. Both PICA origins patent and normal. Basilar patent to its distal aspect without stenosis. Superior cerebellar arteries patent bilaterally. Both PCAs supplied via the basilar as well as small bilateral posterior communicating arteries. PCAs well perfused to their distal aspects without proximal stenosis. Again there is question of possible subtle distal small vessel irregularity. No intracranial aneurysm or other vascular abnormality. MRA NECK FINDINGS AORTIC ARCH: Visualized aortic arch normal in caliber. Aberrant right subclavian artery noted. Right CCA arises directly from the aortic arch. No significant vascular irregularity or stenosis about the origin of the great  vessels. RIGHT CAROTID SYSTEM: Right common and internal carotid arteries widely patent without stenosis, evidence for dissection, or occlusion. LEFT CAROTID SYSTEM: Left common and internal carotid arteries widely patent without stenosis, evidence for dissection, or occlusion. VERTEBRAL ARTERIES: Both vertebral arteries arise from the subclavian arteries. Left vertebral artery dominant. Vertebral arteries patent without stenosis, evidence for dissection, or occlusion. IMPRESSION: MRI HEAD: 1. 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 Possible minimal petechial hemorrhage without hemorrhagic transformation or mass effect. 2. Additional multifocal acute ischemic infarcts involving the right frontal lobe, right basal ganglia, right temporoccipital region, and cerebellum as above. No other associated hemorrhage or mass effect. 3. Underlying remote lacunar infarcts involving the left thalamus and cerebellum. 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 Changes are certainly advanced for patient age. Possible vasculitis would be the primary differential consideration given patient age and the presence of multiple acute and chronic infarcts. MRA HEAD: 1. Question subtle diffuse small vessel irregularity about the intracranial circulation. Differential considerations include early atherosclerotic disease versus changes of vasculopathy. 2. Otherwise normal  MRA appearance of the medium and large intracranial arterial vessels. No large vessel occlusion. No hemodynamically significant or correctable stenosis. 3. Question 2 mm focal outpouching arising from the anterior communicating artery complex, which could reflect focal vascular tortuosity versus a small aneurysm. Attention at follow-up recommended. MRA NECK: 1. Normal MRA of the neck. No  hemodynamically significant stenosis or other acute vascular abnormality. 2. Aberrant right subclavian artery. Electronically Signed   By: Rise Mu M.D.   On: 04/12/2021 05:48   EEG adult  Result Date: 04/12/2021 Charlsie Quest, MD     04/12/2021 12:34 PM Patient Name: Meghan Williamson MRN: [id number] Epilepsy Attending: Charlsie Quest Referring Physician/Provider: Dr Brooke Dare Date: 04/12/2021 Duration: 27.42 mins Patient history: 20 year old female with no significant medical history pertaining to strokes presented for evaluation of expressive aphasia and was found to have left temporal, occipital, parietal region acute ischemic infarcts.   EEG to evaluate for seizures. Level of alertness: Awake AEDs during EEG study: None Technical aspects: This EEG study was done with scalp electrodes positioned according to the 10-20 International system of electrode placement. Electrical activity was acquired at a sampling rate of  and reviewed with a high frequency filter of  and a low frequency filter of . EEG data were recorded continuously and digitally stored. Description: The posterior dominant rhythm consists of 9-10 Hz activity of moderate voltage (25-35 uV) seen predominantly in posterior head regions, symmetric and reactive to eye opening and eye closing. EEG showed continuous 3 to 6 Hz theta-delta slowing in left hemisphere, maximal left temporal region which at times appears rhythmic. Hyperventilation and photic stimulation were not performed.   ABNORMALITY -Continuous slow, left hemisphere, maximal left temporal region IMPRESSION: This study is suggestive of cortical dysfunction left hemisphere, maximal left temporal region likely secondary to underlying stroke.  No seizures or definite epileptiform discharges were seen throughout the recording. Meghan Williamson     Labs:   Basic Metabolic Panel: Recent Labs  Lab 04/11/21 1444  NA 137  K 4.0  CL 105  CO2 24  GLUCOSE 87   BUN 10  CREATININE 0.79  CALCIUM 9.7   GFR Estimated Creatinine Clearance: 106.8 mL/min (by C-G formula based on SCr of 0.79 mg/dL). Liver Function Tests: Recent Labs  Lab 04/11/21 1444  AST 12*  ALT 9  ALKPHOS 45  BILITOT 0.4  PROT 7.2  ALBUMIN 4.4   No results for input(s): LIPASE, AMYLASE in the last 168 hours. No results for input(s): AMMONIA in the last 168 hours. Coagulation profile Recent Labs  Lab 04/11/21 1444  INR 1.1    CBC: Recent Labs  Lab 04/11/21 1444  WBC 4.8  NEUTROABS 2.4  HGB 13.0  HCT 38.7  MCV 81.8  PLT 266   Cardiac Enzymes: No results for input(s): CKTOTAL, CKMB, CKMBINDEX, TROPONINI in the last 168 hours. BNP: Invalid input(s): POCBNP CBG: Recent Labs  Lab 04/11/21 1421 04/12/21 1513  GLUCAP 89 105*   D-Dimer No results for input(s): DDIMER in the last 72 hours. Hgb A1c Recent Labs    04/12/21 0147  HGBA1C QNSTST   Lipid Profile Recent Labs    04/12/21 0147  CHOL 151  HDL 40*  LDLCALC 89  TRIG 409  CHOLHDL 3.8   Thyroid function studies Recent Labs    04/12/21 1405  TSH 1.208   Anemia work up Recent Labs    04/13/21 0835  [id number] 88*   Microbiology Recent Results (from the past  240 hour(s))  Resp Panel by RT-PCR (Flu A&B, Covid) Nasopharyngeal Swab     Status: None   Collection Time: 04/11/21  4:08 PM   Specimen: Nasopharyngeal Swab; Nasopharyngeal(NP) swabs in vial transport medium  Result Value Ref Range Status   SARS Coronavirus 2 by RT PCR NEGATIVE NEGATIVE Final    Comment: (NOTE) SARS-CoV-2 target nucleic acids are NOT DETECTED.  The SARS-CoV-2 RNA is generally detectable in upper respiratory specimens during the acute phase of infection. The lowest concentration of SARS-CoV-2 viral copies this assay can detect is 138 copies/mL. A negative result does not preclude SARS-Cov-2 infection and should not be used as the sole basis for treatment or other patient management decisions. A negative  result may occur with  improper specimen collection/handling, submission of specimen other than nasopharyngeal swab, presence of viral mutation(s) within the areas targeted by this assay, and inadequate number of viral copies(<138 copies/mL). A negative result must be combined with clinical observations, patient history, and epidemiological information. The expected result is Negative.  Fact Sheet for Patients:  BloggerCourse.com  Fact Sheet for Healthcare Providers:  SeriousBroker.it  This test is no t yet approved or cleared by the [place] FDA and  has been authorized for detection and/or diagnosis of SARS-CoV-2 by FDA under an Emergency Use Authorization (EUA). This EUA will remain  in effect (meaning this test can be used) for the duration of the COVID-19 declaration under Section 564(b)(1) of the Act, 21 U.S.C.section 360bbb-3(b)(1), unless the authorization is terminated  or revoked sooner.       Influenza A by PCR NEGATIVE NEGATIVE Final   Influenza B by PCR NEGATIVE NEGATIVE Final    Comment: (NOTE) The Xpert Xpress SARS-CoV-2/FLU/RSV plus assay is intended as an aid in the diagnosis of influenza from Nasopharyngeal swab specimens and should not be used as a sole basis for treatment. Nasal washings and aspirates are unacceptable for Xpert Xpress SARS-CoV-2/FLU/RSV testing.  Fact Sheet for Patients: BloggerCourse.com  Fact Sheet for Healthcare Providers: SeriousBroker.it  This test is not yet approved or cleared by the [place] FDA and has been authorized for detection and/or diagnosis of SARS-CoV-2 by FDA under an Emergency Use Authorization (EUA). This EUA will remain in effect (meaning this test can be used) for the duration of the COVID-19 declaration under Section 564(b)(1) of the Act, 21 U.S.C. section 360bbb-3(b)(1), unless the authorization is  terminated or revoked.  Performed at [facility], [address], [place], [place] [address]   Culture, blood (routine x 2)     Status: None (Preliminary result)   Collection Time: 04/12/21  8:13 AM   Specimen: BLOOD  Result Value Ref Range Status   Specimen Description BLOOD BLOOD RIGHT HAND  Final   Special Requests   Final    BOTTLES DRAWN AEROBIC AND ANAEROBIC Blood Culture adequate volume   Culture   Final    NO GROWTH 2 DAYS Performed at [facility], [address] [place], [place] [address]    Report Status PENDING  Incomplete  Culture, blood (routine x 2)     Status: Abnormal (Preliminary result)   Collection Time: 04/12/21  8:27 AM   Specimen: BLOOD  Result Value Ref Range Status   Specimen Description BLOOD RIGHT ANTECUBITAL  Final   Special Requests   Final    BOTTLES DRAWN AEROBIC AND ANAEROBIC Blood Culture adequate volume   Culture  Setup Time   Final    GRAM POSITIVE RODS ANAEROBIC BOTTLE  ONLY CRITICAL RESULT CALLED TO, READ BACK BY AND VERIFIED WITH: Lana FishM. TUCKER PHARMD, AT 0710 04/13/21 D. VANHOOK    Culture (A)  Final    BACILLUS SPECIES Standardized susceptibility testing for this organism is not available. Performed at [facility], [address] [place], [place] [address]    Report Status PENDING  Incomplete  CSF culture w Gram Stain     Status: None (Preliminary result)   Collection Time: 04/12/21  3:05 PM   Specimen: CSF; Cerebrospinal Fluid  Result Value Ref Range Status   Specimen Description CSF  Final   Special Requests NONE  Final   Gram Stain NO WBC SEEN NO ORGANISMS SEEN CYTOSPIN SMEAR   Final   Culture   Final    NO GROWTH 2 DAYS Performed at [facility], [address] [place], [place] [address]    Report Status PENDING  Incomplete     Discharge Instructions:   Discharge Instructions    Ambulatory referral to Neurology   Complete by: As directed    Follow up with [facility] Pearlean [facility]ethi [facility] in 4-6  weeks. Too complicated for RN to follow. Thanks.   Ambulatory referral to Speech Therapy   Complete by: As directed    Diet general   Complete by: As directed    Discharge instructions   Complete by: As directed    [facility] as scheduled by the clinic.  Take Plavix for the next 21 days and continue taking aspirin.  Please seek medical attention for any worsening symptoms.   Increase activity slowly   Complete by: As directed    No wound care   Complete by: As directed      Allergies as of 04/14/2021   No Known Allergies     Medication List    STOP taking these medications   ibuprofen 200 MG tablet Commonly known as: ADVIL   Sronyx 0.1-20 MG-MCG tablet Generic drug: levonorgestrel-ethinyl estradiol     TAKE these medications   acetaminophen 325 MG tablet Commonly known as: TYLENOL Take 1 tablet (325 mg total) by mouth every 6 (six) hours as needed for up to 5 days for mild pain or headache.   aspirin 81 MG EC tablet Take 1 tablet (81 mg total) by mouth daily. Swallow whole. Start taking on: April 15, 2021   atorvastatin 10 MG tablet Commonly known as: LIPITOR Take 2 tablets (20 mg total) by mouth at bedtime.   clopidogrel 75 MG tablet Commonly known as: PLAVIX Take 1 tablet (75 mg total) by mouth daily. Start taking on: April 15, 2021       Follow-up Information    [facility].   Specialty: Rehabilitation Why: The outpatient rehab will contact you for the first appointment Contact information: [address] [id number] mc [place] [id number] [phone]       Micki RileySethi, Pramod S, MD. Schedule an appointment as soon as possible for a visit in 4 week(s).   Specialties: Neurology, Radiology Contact information: [address] [place] [address] [phone]                Time coordinating discharge: 39 minutes  Signed:  Valda Christenson  [facility] 04/14/2021, 4:14 PM

## 2021-04-14 NOTE — Progress Notes (Signed)
NIR.  Patient underwent an image-guided diagnostic cerebral arteriogram via right radial (unable to advance secondary to aberrant right subclavian artery) and right femoral approach 04/13/2021. Following procedure, patient noted to have mild right femoral hematoma- this was compressed by IR team.  Patient evaluated bedside this AM. Patient awake and alert laying in bed with no complaints a this time. Mother and father at bedside. Patient states she has walked to bathroom multiple   Right radial puncture site soft without active bleeding or hematoma. Right femoral puncture site soft with surrounding ecchymosis (mildly tender), no active bleeding or hematoma noted. Distal pulses (radial, DPs) palpable bilaterally.  Right femoral puncture site without evidence of hematoma/pseudoaneurysm. Advised patient/family to call nursing staff if they notice increased swelling, pain, or pain with step of right femoral puncture site. Verdon Cummins, RN updated on this as well- also requested dressing change of this area. Please call NIR with questions/concerns.   Waylan Boga Aamya Orellana, PA-C 04/14/2021, 9:48 AM

## 2021-04-15 ENCOUNTER — Encounter (HOSPITAL_COMMUNITY): Payer: Self-pay | Admitting: Cardiology

## 2021-04-15 LAB — CULTURE, BLOOD (ROUTINE X 2): Special Requests: ADEQUATE

## 2021-04-15 LAB — CSF CULTURE W GRAM STAIN
Culture: NO GROWTH
Gram Stain: NONE SEEN

## 2021-04-15 LAB — HEMOGLOBIN A1C
Hgb A1c MFr Bld: 5.3 % (ref 4.8–5.6)
Mean Plasma Glucose: 105 mg/dL

## 2021-04-16 ENCOUNTER — Ambulatory Visit: Payer: BC Managed Care – PPO | Attending: Internal Medicine

## 2021-04-16 ENCOUNTER — Other Ambulatory Visit: Payer: Self-pay

## 2021-04-16 DIAGNOSIS — R4701 Aphasia: Secondary | ICD-10-CM | POA: Insufficient documentation

## 2021-04-16 DIAGNOSIS — R41841 Cognitive communication deficit: Secondary | ICD-10-CM | POA: Diagnosis present

## 2021-04-16 LAB — FACTOR 5 LEIDEN

## 2021-04-16 NOTE — Addendum Note (Signed)
Addended by: Verdie Mosher B on: 04/16/2021 05:04 PM   Modules accepted: Orders

## 2021-04-16 NOTE — Therapy (Signed)
[facility] [address] [place], [place], [address] Phone: [phone]   Fax:  [phone]  Speech Language Pathology Evaluation  Patient Details  Name: Meghan Williamson MRN: [id number] Date of Birth: 04-14-2001 Referring Provider (SLP): Joycelyn Das, MD (referred), Farris Has, MD (documentation), Lina Sayre (documentation)   Encounter Date: 04/16/2021   End of Session - 04/16/21 1626    Visit Number 1    Number of Visits 17    Date for SLP Re-Evaluation 06/15/21    SLP Start Time 0804    SLP Stop Time  0846    SLP Time Calculation (min) 42 min    Activity Tolerance Patient tolerated treatment well           Past Medical History:  Diagnosis Date  . Anxiety   . Asthma     Past Surgical History:  Procedure Laterality Date  . BUBBLE STUDY  04/14/2021   Procedure: BUBBLE STUDY;  Surgeon: Meriam Sprague, MD;  Location: [facility] ENDOSCOPY;  Service: Cardiovascular;;  . IR ANGIO EXTERNAL CAROTID SEL EXT CAROTID BILAT MOD SED  04/13/2021      . IR ANGIO EXTERNAL CAROTID SEL EXT CAROTID BILAT MOD SED  04/13/2021  . IR ANGIO INTRA EXTRACRAN SEL INTERNAL CAROTID BILAT MOD SED  04/13/2021  . IR ANGIO VERTEBRAL SEL SUBCLAVIAN INNOMINATE BILAT MOD SED  04/13/2021  . IR US GUIDE VASC ACCESS RIGHT  04/13/2021  . TEE WITHOUT CARDIOVERSION N/A 04/14/2021   Procedure: TRANSESOPHAGEAL ECHOCARDIOGRAM (TEE);  Surgeon: Meriam Sprague, MD;  Location: [facility] ENDOSCOPY;  Service: Cardiovascular;  Laterality: N/A;    There were no vitals filed for this visit.       SLP Evaluation [facility] - 04/16/21 3235      SLP Visit Information   SLP Received On 04/16/21    Referring Provider (SLP) Joycelyn Das, MD    Onset Date 04-10-21    Medical Diagnosis CVA      Subjective   Patient/Family Stated Goal return to PLOF      General Information   HPI Patient is an 20 y.o. female with PMH: anxiety, intermittent seasonal allergies inducing  asthma with no respiratory problems or symptoms. She presented to ER for evaluation of difficulty speaking and memory problems that began 5/28. Patient reported she was driving home from work at [employer] when she developed a left sided headache that she reports was throbbing but did not radiate. She she arrived home she had difficulty speaking and when she tried texting her Mom who was on vacation at the beach at the time, she could only type out jumbled letters that did not make coherent sense. Patient has also reported some memory lapses and difficulty remembering things. 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  CT head was negative but MRI brain revealed acute ischemic cortical infarct involving left temporal occipital and parietal region possible minimal petechial hemorrhage without hemorrhagic transformation or mass-effect.  Multifocal acute ischemic infarcts involving the right frontal lobe, right basal ganglia, right temporal occipital region and cerebellum.  Underlying remote lacunar infarcts involving left thalamus and cerebellum.      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 Today pt did not recall >2 courses she took in spring semester, and then told SLP "Maybe one of those was actually in first semester." SLP may have to assess pt's cognitive abilities at some time during therapy course.    Behaviors Restless   pt fidgety, but possibly due to  anxiety/stress pt's baseline     Auditory Comprehension   Overall Auditory Comprehension Impaired    Yes/No Questions Impaired    Complex Questions --   involving before and after/complex comparatives   Overall Auditory Comprehension Comments Pt appeared to understand speakers in 7 minute simple-mod complex conversation     Expression   Primary Mode of Expression Verbal      Verbal Expression   Overall Verbal Expression Impaired    Interfering Components Attention    Other Verbal Expression Comments Usage of "like" very prevalent in first 5-7 minutes but mostly likely due to pt anxiety/nerves.  Pt's expressive language in conversation was vague at times, lacking descriptive words and more complex sentence structure.     Oral Motor/Sensory Function   Overall Oral Motor/Sensory Function Appears within functional limits for tasks assessed      Motor Speech   Overall Motor Speech Appears within functional limits for tasks assessed      Standardized Assessments   Standardized Assessments  Western Aphasia Battery revised   initiated                          SLP Education - 04/16/21 1636    Education Details therapy plan, home tasks - get help from mom and/or dad if necessary, spend little time watching or playing on phone and do rehab things instead    Person(s) Educated Patient;Parent(s)    Methods Explanation;Demonstration;Handout    Comprehension Verbalized understanding;Returned demonstration;Need further instruction            SLP Short Term Goals - 04/16/21 1639      SLP SHORT TERM GOAL #1   Title pt will verbalize daily schedule, recent events and salient information using external memory aids  with rare min A to section in binder/app in phone, in 3 sessions    Time 4    Period Weeks    Target Date 05/16/21      SLP SHORT TERM GOAL #2   Title pt will read 3-4 sentences technical/academic information and demonstrate understanding by answering "wh"/yes/no  questions with 90% success using compensations, in 3 sessions    Time 4    Period Weeks    Status New    Target Date 05/16/21      SLP SHORT TERM GOAL #3   Title pt will participate functionally in 10 minutes simple conversation using anomia compensations in 3 sessions    Time 4    Period Weeks    Status New    Target Date 05/16/21            SLP Long Term Goals - 04/16/21 1644      SLP LONG TERM GOAL #1   Title pt will participate functionally in 15 minutes simple-mod complex conversation using anomia compensations in 3 sessions    Time  8    Period Weeks    Status New    Target Date 06/15/21      SLP LONG TERM GOAL #2   Title pt will verbalize daily schedule, recent events and salient information using external memory aids  with rare min A to section in binder/app in phone, in 3 sessions    Time 8    Period Weeks    Status New    Target Date 06/15/21      SLP LONG TERM GOAL #3   Title pt will read 3 paragraphs of technical/academic information and demonstrate understanding by answering "wh"/yes/no questions with 90% success using compensations, in 3 sessions    Time 8    Period Weeks    Status New    Target Date 06/15/21            Plan - 04/16/21 1626    Clinical Impression Statement Pt presents with receptive and expressive aphasia, with a 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 Today, pt's picture description utlized extremely simple (subject verb object) sentence structures ("They're flying a kite", "The girl is making a sand castle", "This guy is reading.", "And this girl is drinking.", "It's a picnic."). Anomia for more specific words was observed (e.g., "The flag is um - - the flag is - in the wind.") This was not unlike pt's conversational speech, which was more vague when describing something specific. Receptively pt had difficulty with more complex yes/no involving comparatives before/after however simple to mod complex conversation appeared WNL. Difficult to tell at this  time whether or not cognition is depressing pt's conversational skills with word finding. 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 She was training to become a server. Meghan Williamson's father states her friend proofread one of her final papers and found many more errors than usual with pt's writing. Pt would benefit from skilled ST targeting language and cognition.    Speech Therapy Frequency 2x / week    Duration 8 weeks    Treatment/Interventions Environmental controls;Compensatory techniques;Functional tasks;Multimodal communcation approach;SLP instruction and feedback;Cueing hierarchy;Language facilitation;Cognitive reorganization;Patient/family education;Internal/external aids    Potential to Achieve Goals Good    Consulted and Agree with Plan of Care Patient           Patient will benefit from skilled therapeutic intervention in order to improve the following deficits and impairments:   Aphasia  Cognitive communication deficit    Problem List Patient Active Problem List   Diagnosis Date Noted  . Acute CVA (cerebrovascular accident) (HCC) 04/12/2021  . Dysarthria 04/11/2021  . Memory difficulties 04/11/2021    [facility] 04/16/2021, 4:46 PM  [facility] [address] [place], [place], [address] Phone: [phone]   Fax:  [phone]  Name: Meghan Williamson MRN: [id number] Date of Birth: [birth year]

## 2021-04-17 LAB — CULTURE, BLOOD (ROUTINE X 2)
Culture: NO GROWTH
Special Requests: ADEQUATE

## 2021-04-19 LAB — PROTHROMBIN GENE MUTATION

## 2021-04-20 ENCOUNTER — Other Ambulatory Visit: Payer: Self-pay

## 2021-04-20 ENCOUNTER — Ambulatory Visit: Payer: BC Managed Care – PPO

## 2021-04-20 DIAGNOSIS — R41841 Cognitive communication deficit: Secondary | ICD-10-CM

## 2021-04-20 DIAGNOSIS — R4701 Aphasia: Secondary | ICD-10-CM | POA: Diagnosis not present

## 2021-04-20 NOTE — Therapy (Signed)
[facility] [address] [place], [place], [address] Phone: [phone]   Fax:  [phone]  Speech Language Pathology Treatment  Patient Details  Name: Meghan Williamson MRN: [id number] Date of Birth: 16-Feb-2001 Referring Provider (SLP): Joycelyn Das, MD   Encounter Date: 04/20/2021   End of Session - 04/20/21 1110    Visit Number 2    Number of Visits 17    Date for SLP Re-Evaluation 06/15/21    SLP Start Time 0934    SLP Stop Time  1015    SLP Time Calculation (min) 41 min    Activity Tolerance Patient tolerated treatment well           Past Medical History:  Diagnosis Date  . Anxiety   . Asthma     Past Surgical History:  Procedure Laterality Date  . BUBBLE STUDY  04/14/2021   Procedure: BUBBLE STUDY;  Surgeon: Meriam Sprague, MD;  Location: [facility] ENDOSCOPY;  Service: Cardiovascular;;  . IR ANGIO EXTERNAL CAROTID SEL EXT CAROTID BILAT MOD SED  04/13/2021      . IR ANGIO EXTERNAL CAROTID SEL EXT CAROTID BILAT MOD SED  04/13/2021  . IR ANGIO INTRA EXTRACRAN SEL INTERNAL CAROTID BILAT MOD SED  04/13/2021  . IR ANGIO VERTEBRAL SEL SUBCLAVIAN INNOMINATE BILAT MOD SED  04/13/2021  . IR US GUIDE VASC ACCESS RIGHT  04/13/2021  . TEE WITHOUT CARDIOVERSION N/A 04/14/2021   Procedure: TRANSESOPHAGEAL ECHOCARDIOGRAM (TEE);  Surgeon: Meriam Sprague, MD;  Location: [facility] ENDOSCOPY;  Service: Cardiovascular;  Laterality: N/A;    There were no vitals filed for this visit.   Subjective Assessment - 04/20/21 0940    Subjective "[name] tells me I get the "he and she" mixed up but for the most part, it's pretty good.    Currently in Pain? Yes    Pain Score 3     Pain Location --   thigh   Pain Orientation Right    Pain Descriptors / Indicators Sore;Tender    Pain Type Acute pain    Pain Onset 1 to 4 weeks ago    Aggravating Factors  walking, use    Pain Relieving Factors rest                 ADULT SLP  TREATMENT - 04/20/21 0941      General Information   Behavior/Cognition Alert;Cooperative;Pleasant mood      Treatment Provided   Treatment provided Cognitive-Linquistic      Cognitive-Linquistic Treatment   Treatment focused on Aphasia    Skilled Treatment Pt states that she still having some errors with dysnomia/anomia but her biggest error is he/she. Sharita is using simpler texting now, as a compensatory measure. 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 Today, SLP engaged pt in conversation to assess her abilities in mod compelx conversation - pt with two dysnomias and both were noted and corrrected in 15 minutes conversation. Today, SLP ascertained pt with possible memory deficit/attention deficit. Pt may require assessment of cognition-communication. Homework to write 2-3 paragraph summary of two Criminal Minds episodes. Pt asked SLP if she should write that down and she did in her "notes" app.      Assessment / Recommendations / Plan   Plan Continue with current plan of care      Progression Toward Goals   Progression toward goals Progressing toward goals  SLP Education - 04/20/21 1110    Education Details results of WAB-R    Person(s) Educated Patient;Parent(s)    Methods Explanation    Comprehension Verbalized understanding            SLP Short Term Goals - 04/20/21 1318      SLP SHORT TERM GOAL #1   Title pt will verbalize daily schedule, recent events and salient information using external memory aids  with rare min A to section in binder/app in phone, in 3 sessions    Time 4    Period Weeks    Status On-going    Target Date 05/16/21      SLP SHORT TERM GOAL #2   Title pt will read 3-4 sentences technical/academic information and demonstrate understanding by answering "wh"/yes/no questions with 90% success using compensations, in 3 sessions    Time 4    Period Weeks     Status On-going    Target Date 05/16/21      SLP SHORT TERM GOAL #3   Title pt will participate functionally in 10 minutes simple conversation using anomia compensations in 3 sessions    Time 4    Period Weeks    Status On-going    Target Date 05/16/21            SLP Long Term Goals - 04/20/21 1319      SLP LONG TERM GOAL #1   Title pt will participate functionally in 15 minutes simple-mod complex conversation using anomia compensations in 3 sessions    Time 8    Period Weeks    Status On-going    Target Date 06/15/21      SLP LONG TERM GOAL #2   Title pt will verbalize daily schedule, recent events and salient information using external memory aids  with rare min A to section in binder/app in phone, in 3 sessions    Time 8    Period Weeks    Status On-going      SLP LONG TERM GOAL #3   Title pt will read 3 paragraphs of technical/academic information and demonstrate understanding by answering "wh"/yes/no questions with 90% success using compensations, in 3 sessions    Time 8    Period Weeks    Status On-going            Plan - 04/20/21 1111    Clinical Impression Statement Pt presents with receptive aphasia with more complex language, and mild anomic expressive aphasia, with a 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 Pt will need cognitive assessment. Today, pt's picture description utlized extremely simple (subject verb object) sentence structures ("They're flying a kite", "The girl is making a sand castle", "This guy is reading.", "And this girl is drinking.", "It's a picnic."). Anomia for more specific words was observed (e.g., "The flag is um - - the flag is - in the wind.") This was not unlike pt's conversational speech, which was more vague when describing something specific. Receptively pt had difficulty with more complex yes/no involving comparatives before/after however simple to mod complex conversation appeared WNL. Difficult to tell at this time whether or not cognition is  depressing pt's conversational skills with word finding. 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 Daliah's father states her friend proofread one of her final papers and found many more errors than usual with pt's writing. Pt would benefit from skilled  ST targeting language and cognition.    Speech Therapy Frequency 2x / week    Duration 8 weeks    Treatment/Interventions Environmental controls;Compensatory techniques;Functional tasks;Multimodal communcation approach;SLP instruction and feedback;Cueing hierarchy;Language facilitation;Cognitive reorganization;Patient/family education;Internal/external aids    Potential to Achieve Goals Good    Consulted and Agree with Plan of Care Patient           Patient will benefit from skilled therapeutic intervention in order to improve the following deficits and impairments:   Aphasia  Cognitive communication deficit    Problem List Patient Active Problem List   Diagnosis Date Noted  . Acute CVA (cerebrovascular accident) (HCC) 04/12/2021  . Dysarthria 04/11/2021  . Memory difficulties 04/11/2021    [facility] ,MS, CCC-SLP  04/20/2021, 1:20 PM   [facility] [address] [place], [place], [address] Phone: [phone]   Fax:  [phone]   Name: Jennipher Weatherholtz MRN: [id number] Date of Birth: 10/30/2001

## 2021-04-22 ENCOUNTER — Other Ambulatory Visit (HOSPITAL_COMMUNITY): Payer: Self-pay | Admitting: Family Medicine

## 2021-04-22 ENCOUNTER — Other Ambulatory Visit: Payer: Self-pay

## 2021-04-22 ENCOUNTER — Ambulatory Visit (INDEPENDENT_AMBULATORY_CARE_PROVIDER_SITE_OTHER)
Admission: RE | Admit: 2021-04-22 | Discharge: 2021-04-22 | Disposition: A | Discharge status: Home / Self Care | Payer: BC Managed Care – PPO | Source: Ambulatory Visit | Attending: Family Medicine | Admitting: Family Medicine

## 2021-04-22 ENCOUNTER — Ambulatory Visit: Payer: BC Managed Care – PPO

## 2021-04-22 ENCOUNTER — Ambulatory Visit (HOSPITAL_COMMUNITY)
Admission: RE | Admit: 2021-04-22 | Discharge: 2021-04-22 | Disposition: A | Discharge status: Home / Self Care | Payer: BC Managed Care – PPO | Source: Ambulatory Visit | Attending: Family Medicine | Admitting: Family Medicine

## 2021-04-22 DIAGNOSIS — M25451 Effusion, right hip: Secondary | ICD-10-CM

## 2021-04-22 DIAGNOSIS — R4701 Aphasia: Secondary | ICD-10-CM | POA: Diagnosis not present

## 2021-04-22 DIAGNOSIS — Z9889 Other specified postprocedural states: Secondary | ICD-10-CM

## 2021-04-22 DIAGNOSIS — R41841 Cognitive communication deficit: Secondary | ICD-10-CM

## 2021-04-22 NOTE — Patient Instructions (Signed)
Semantic Feature Analysis (SFA) handout

## 2021-04-22 NOTE — Therapy (Signed)
[facility] [address] [place], [place], [address] Phone: [phone]   Fax:  [phone]  Speech Language Pathology Treatment  Patient Details  Name: Meghan Williamson MRN: [id number] Date of Birth: Oct 28, 2001 Referring Provider (SLP): Meghan Das, MD   Encounter Date: 04/22/2021   End of Session - 04/22/21 1333     Visit Number 3    Number of Visits 17    Date for SLP Re-Evaluation 06/15/21    SLP Start Time 0933    SLP Stop Time  1015    SLP Time Calculation (min) 42 min    Activity Tolerance Patient tolerated treatment well             Past Medical History:  Diagnosis Date   Anxiety    Asthma     Past Surgical History:  Procedure Laterality Date   BUBBLE STUDY  04/14/2021   Procedure: BUBBLE STUDY;  Surgeon: Meghan Sprague, MD;  Location: [facility] ENDOSCOPY;  Service: Cardiovascular;;   IR ANGIO EXTERNAL CAROTID SEL EXT CAROTID BILAT MOD SED  04/13/2021       IR ANGIO EXTERNAL CAROTID SEL EXT CAROTID BILAT MOD SED  04/13/2021   IR ANGIO INTRA EXTRACRAN SEL INTERNAL CAROTID BILAT MOD SED  04/13/2021   IR ANGIO VERTEBRAL SEL SUBCLAVIAN INNOMINATE BILAT MOD SED  04/13/2021   IR US GUIDE VASC ACCESS RIGHT  04/13/2021   TEE WITHOUT CARDIOVERSION N/A 04/14/2021   Procedure: TRANSESOPHAGEAL ECHOCARDIOGRAM (TEE);  Surgeon: Meghan Sprague, MD;  Location: [facility] ENDOSCOPY;  Service: Cardiovascular;  Laterality: N/A;    There were no vitals filed for this visit.   Subjective Assessment - 04/22/21 1257     Subjective "I probably had between 5-10 times (with anoima/dysnomia) per day."    Currently in Pain? No/denies    Pain Onset 1 to 4 weeks ago                   ADULT SLP TREATMENT - 04/22/21 1258       General Information   Behavior/Cognition Alert;Cooperative;Pleasant mood      Treatment Provided   Treatment provided Cognitive-Linquistic      Cognitive-Linquistic Treatment   Treatment focused  on Aphasia    Skilled Treatment Pt did not do her homework to summarize two episodes of Criminal Minds. SLP told pt to complete this for next session, in addition to working 30 minutes/day on her worksheet packet from [facility] [facility] [employer] book, and do two semantic feature analysis per day. SLP today showed pt how to complete semantic feature analysis (SFA)- pt req'd min A for properties, group (category), and association and extra time for use. Two dysnomias were noted during SFA task, and pt req'd mod A from SLP for each instance in order to generate her target word. In 10 minutes of simple to mod complex conversation pt without word finding deficits.      Assessment / Recommendations / Plan   Plan Continue with current plan of care   consider CLQT     Progression Toward Goals   Progression toward goals Progressing toward goals              SLP Education - 04/22/21 1333     Education Details SFA procedure    Person(s) Educated Patient    Methods Explanation;Handout;Verbal cues    Comprehension Verbalized understanding;Returned demonstration;Verbal cues required              SLP Short Term Goals - 04/22/21  1337       SLP SHORT TERM GOAL #1   Title pt will verbalize daily schedule, recent events and salient information using external memory aids  with rare min A to section in binder/app in phone, in 3 sessions    Time 4    Period Weeks    Status On-going    Target Date 05/16/21      SLP SHORT TERM GOAL #2   Title pt will read 3-4 sentences technical/academic information and demonstrate understanding by answering "wh"/yes/no questions with 90% success using compensations, in 3 sessions    Time 4    Period Weeks    Status On-going    Target Date 05/16/21      SLP SHORT TERM GOAL #3   Title pt will participate functionally in 10 minutes simple conversation using anomia compensations in 3 sessions    Time 4    Period Weeks    Status On-going    Target Date 05/16/21               SLP Long Term Goals - 04/22/21 1337       SLP LONG TERM GOAL #1   Title pt will participate functionally in 15 minutes simple-mod complex conversation using anomia compensations in 3 sessions    Time 8    Period Weeks    Status On-going      SLP LONG TERM GOAL #2   Title pt will verbalize daily schedule, recent events and salient information using external memory aids  with rare min A to section in binder/app in phone, in 3 sessions    Time 8    Period Weeks    Status On-going      SLP LONG TERM GOAL #3   Title pt will read 3 paragraphs of technical/academic information and demonstrate understanding by answering "wh"/yes/no questions with 90% success using compensations, in 3 sessions    Time 8    Period Weeks    Status On-going              Plan - 04/22/21 1334     Clinical Impression Statement Pt presents with receptive aphasia in the setting of more complex language comprehension, and mild anomic expressive aphasia, with a 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 Pt will need cognitive assessment. Anomia for more specific words was observed during semantic feature analysis task. 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 Meghan Williamson's father states her friend proofread one of her final papers and found many more errors than usual with pt's writing. Pt would benefit from skilled ST targeting language and cognition.    Speech Therapy Frequency 2x / week    Duration 8 weeks    Treatment/Interventions Environmental controls;Compensatory techniques;Functional tasks;Multimodal communcation approach;SLP instruction and feedback;Cueing hierarchy;Language facilitation;Cognitive reorganization;Patient/family education;Internal/external aids    Potential to Achieve Goals Good    Consulted and Agree with Plan of Care Patient             Patient will benefit from skilled therapeutic intervention in order  to improve the following deficits and impairments:   Aphasia  Cognitive communication deficit    Problem List Patient Active Problem List   Diagnosis Date Noted   Acute CVA (cerebrovascular accident) (HCC) 04/12/2021   Dysarthria 04/11/2021   Memory difficulties 04/11/2021    Samson Ralph ,MS, CCC-SLP   04/22/2021, 1:38 PM  [facility] [address] [place], [place], [address] Phone: [phone]   Fax:  [phone]   Name: Meghan Williamson MRN: [id number] Date of Birth: 2001-08-19

## 2021-04-23 ENCOUNTER — Telehealth: Payer: Self-pay | Admitting: Hematology and Oncology

## 2021-04-23 NOTE — Telephone Encounter (Signed)
Scheduled appt per 6/10 referral. Pt and pt's mother are aware.

## 2021-04-27 ENCOUNTER — Other Ambulatory Visit: Payer: Self-pay

## 2021-04-27 ENCOUNTER — Telehealth: Payer: Self-pay | Admitting: Neurology

## 2021-04-27 ENCOUNTER — Ambulatory Visit: Payer: BC Managed Care – PPO

## 2021-04-27 ENCOUNTER — Telehealth: Payer: Self-pay | Admitting: Emergency Medicine

## 2021-04-27 DIAGNOSIS — R41841 Cognitive communication deficit: Secondary | ICD-10-CM

## 2021-04-27 DIAGNOSIS — R4701 Aphasia: Secondary | ICD-10-CM

## 2021-04-27 NOTE — Telephone Encounter (Signed)
Returned patient mom's phone call.  Patient had 2 tests abnormal with hypercoagulable work-up.  Protein S total was mildly low at 47, however, protein S activity was normal.  Told patient mom that given protein S activity was normal, I have not concerned too much about mildly low total level of protein S.  In addition, patient factor V Leiden test came back showing heterozygous mutation, which indicate 6-8 fold increased risk for venous thrombosis.  Together with patient recent use of estrogen and intermittent smoking, patient was at high risk of venous thrombosis.  However, patient LE venous Doppler during admission was negative for DVT.  Patient multifocal stroke last admission was more consistent with arterial embolization instead of cerebral venous sinus thrombosis.  Therefore, the factor V Leiden mutation may not related to patient stroke.  However, agree to have hematology consultation for further management regarding factor V Leiden mutation.  Patient mom stated that patient has had appointment to see hematology in 2 weeks.  I would like to hear the opinions and recommendations from her hematologist.  Patient also has appointment with Dr. Pearlean Brownie at stroke clinic on 07/06/2021.  Reminded mom to not miss the appointment.  She expressed understanding and appreciation.  Marvel Plan, MD PhD Stroke Neurology 04/27/2021 5:29 PM

## 2021-04-27 NOTE — Telephone Encounter (Signed)
LM with patient's mom to have patient return call regarding blood work results.

## 2021-04-27 NOTE — Telephone Encounter (Signed)
LVM on mobile number (home number answered to [employer]) for return call

## 2021-04-27 NOTE — Therapy (Signed)
[facility] [address] [place], [place], [address] Phone: [phone]   Fax:  [phone]  Speech Language Pathology Treatment  Patient Details  Name: Meghan Williamson MRN: [id number] Date of Birth: 06-Mar-2001 Referring Provider (SLP): Joycelyn Das, MD   Encounter Date: 04/27/2021   End of Session - 04/27/21 1124     Visit Number 4    Number of Visits 17    Date for SLP Re-Evaluation 06/15/21    SLP Start Time 1145    SLP Stop Time  1230    SLP Time Calculation (min) 45 min    Activity Tolerance Patient tolerated treatment well             Past Medical History:  Diagnosis Date   Anxiety    Asthma     Past Surgical History:  Procedure Laterality Date   BUBBLE STUDY  04/14/2021   Procedure: BUBBLE STUDY;  Surgeon: Meriam Sprague, MD;  Location: [facility] ENDOSCOPY;  Service: Cardiovascular;;   IR ANGIO EXTERNAL CAROTID SEL EXT CAROTID BILAT MOD SED  04/13/2021       IR ANGIO EXTERNAL CAROTID SEL EXT CAROTID BILAT MOD SED  04/13/2021   IR ANGIO INTRA EXTRACRAN SEL INTERNAL CAROTID BILAT MOD SED  04/13/2021   IR ANGIO VERTEBRAL SEL SUBCLAVIAN INNOMINATE BILAT MOD SED  04/13/2021   IR US GUIDE VASC ACCESS RIGHT  04/13/2021   TEE WITHOUT CARDIOVERSION N/A 04/14/2021   Procedure: TRANSESOPHAGEAL ECHOCARDIOGRAM (TEE);  Surgeon: Meriam Sprague, MD;  Location: [facility] ENDOSCOPY;  Service: Cardiovascular;  Laterality: N/A;    There were no vitals filed for this visit.   Subjective Assessment - 04/27/21 1130     Subjective "I have been working on my packet"    Currently in Pain? No/denies                   ADULT SLP TREATMENT - 04/27/21 1123       General Information   Behavior/Cognition Alert;Cooperative;Pleasant mood      Treatment Provided   Treatment provided Cognitive-Linquistic      Cognitive-Linquistic Treatment   Treatment focused on Aphasia;Cognition    Skilled Treatment Pt completed HEP for  SFA and episode analysis of favorite show. 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 Use of more simplistic language with unfamiliar listeners indicated as pt does not want to ask unfamiliar listeners for help when word finding occurs. Pt endorses family is providing appropriate cues versus targeted word. CLQT completed this session to further assess cognitive communication, which revealed mild deficits for memory and language. Pt noted with increased difficulty with story rell and generative naming. Pt stated "I have trouble following" after retell of story and pt endorses she occasionally misses details/information in conversation. Pt endorses usual need for additional processing time, which is reportedly similiar to her baseline. Pt gave verbal recap of favorite episode with no overt word finding in retell. Some difficulty with names and minor details endorsed when writing synposis. SLP suggested continuing to work on reading to target comprehension and focus. 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      Assessment / Recommendations / Plan   Plan Continue with current plan of care      Progression Toward Goals   Progression toward goals Progressing toward goals              SLP Education - 04/27/21 1230  Education Details CLQT results, reading aloud, writing notes for comprehension and recall    Person(s) Educated Patient    Methods Explanation;Demonstration    Comprehension Verbalized understanding;Returned demonstration;Need further instruction              SLP Short Term Goals - 04/27/21 1125       SLP SHORT TERM GOAL #1   Title pt will verbalize daily schedule, recent events and salient information using external memory aids  with rare min A to section in binder/app in phone, in 3 sessions    Time 3    Period Weeks    Status On-going    Target Date 05/16/21      SLP SHORT TERM GOAL #2    Title pt will read 3-4 sentences technical/academic information and demonstrate understanding by answering "wh"/yes/no questions with 90% success using compensations, in 3 sessions    Time 3    Period Weeks    Status On-going    Target Date 05/16/21      SLP SHORT TERM GOAL #3   Title pt will participate functionally in 10 minutes simple conversation using anomia compensations in 3 sessions    Time 3    Period Weeks    Status On-going    Target Date 05/16/21              SLP Long Term Goals - 04/27/21 1125       SLP LONG TERM GOAL #1   Title pt will participate functionally in 15 minutes simple-mod complex conversation using anomia compensations in 3 sessions    Time 7    Period Weeks    Status On-going      SLP LONG TERM GOAL #2   Title pt will verbalize daily schedule, recent events and salient information using external memory aids  with rare min A to section in binder/app in phone, in 3 sessions    Time 7    Period Weeks    Status On-going      SLP LONG TERM GOAL #3   Title pt will read 3 paragraphs of technical/academic information and demonstrate understanding by answering "wh"/yes/no questions with 90% success using compensations, in 3 sessions    Time 7    Period Weeks    Status On-going              Plan - 04/27/21 1127     Clinical Impression Statement Pt presents with receptive aphasia in the setting of more complex language comprehension, and mild anomic expressive aphasia, with a 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 CLQT completed this session, which revealed mild language and memory deficits. 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 Pt would benefit from skilled ST targeting language and cognition.    Speech Therapy Frequency 2x / week    Duration 8 weeks     Treatment/Interventions Environmental controls;Compensatory techniques;Functional tasks;Multimodal communcation approach;SLP instruction and feedback;Cueing hierarchy;Language facilitation;Cognitive reorganization;Patient/family education;Internal/external aids    Potential to Achieve Goals Good    SLP Home Exercise Plan provided    Consulted and Agree with Plan of Care Patient             Patient will benefit from skilled therapeutic intervention in order to improve the following deficits and impairments:   Aphasia  Cognitive communication deficit  Problem List Patient Active Problem List   Diagnosis Date Noted   Acute CVA (cerebrovascular accident) (HCC) 04/12/2021   Dysarthria 04/11/2021   Memory difficulties 04/11/2021    Janann Colonel, MA CCC-SLP 04/27/2021, 1:34 PM  [facility] [address] [place], [place], [address] Phone: [phone]   Fax:  [phone]   Name: Meghan Williamson MRN: [id number] Date of Birth: 28-Apr-2001

## 2021-04-27 NOTE — Telephone Encounter (Signed)
Called and spoke to patient and her mom (Amy).  Notified patient of low B12 and referred to contact PCP regarding B12 replacement.  Was questioning where the genetic disorders testing and I referred patient's mom back to the PCP for results and referrals.

## 2021-04-29 ENCOUNTER — Ambulatory Visit: Payer: BC Managed Care – PPO

## 2021-04-29 ENCOUNTER — Other Ambulatory Visit: Payer: Self-pay

## 2021-04-29 DIAGNOSIS — R4701 Aphasia: Secondary | ICD-10-CM

## 2021-04-29 DIAGNOSIS — R41841 Cognitive communication deficit: Secondary | ICD-10-CM

## 2021-04-29 LAB — MTHFR DNA ANALYSIS

## 2021-04-29 NOTE — Therapy (Signed)
[facility] [address] [place], [place], [address] Phone: [phone]   Fax:  [phone]  Speech Language Pathology Treatment  Patient Details  Name: Meghan Williamson MRN: [id number] Date of Birth: 19-Dec-2000 Referring Provider (SLP): Joycelyn Das, MD   Encounter Date: 04/29/2021   End of Session - 04/29/21 1243     Visit Number 5    Number of Visits 17    Date for SLP Re-Evaluation 06/15/21    SLP Start Time 1231    SLP Stop Time  1315    SLP Time Calculation (min) 44 min    Activity Tolerance Patient tolerated treatment well             Past Medical History:  Diagnosis Date   Anxiety    Asthma     Past Surgical History:  Procedure Laterality Date   BUBBLE STUDY  04/14/2021   Procedure: BUBBLE STUDY;  Surgeon: Meriam Sprague, MD;  Location: [facility] ENDOSCOPY;  Service: Cardiovascular;;   IR ANGIO EXTERNAL CAROTID SEL EXT CAROTID BILAT MOD SED  04/13/2021       IR ANGIO EXTERNAL CAROTID SEL EXT CAROTID BILAT MOD SED  04/13/2021   IR ANGIO INTRA EXTRACRAN SEL INTERNAL CAROTID BILAT MOD SED  04/13/2021   IR ANGIO VERTEBRAL SEL SUBCLAVIAN INNOMINATE BILAT MOD SED  04/13/2021   IR US GUIDE VASC ACCESS RIGHT  04/13/2021   TEE WITHOUT CARDIOVERSION N/A 04/14/2021   Procedure: TRANSESOPHAGEAL ECHOCARDIOGRAM (TEE);  Surgeon: Meriam Sprague, MD;  Location: [facility] ENDOSCOPY;  Service: Cardiovascular;  Laterality: N/A;    There were no vitals filed for this visit.   Subjective Assessment - 04/29/21 1232     Subjective "work was really slow"    Currently in Pain? No/denies                   ADULT SLP TREATMENT - 04/29/21 1232       General Information   Behavior/Cognition Alert;Cooperative;Pleasant mood      Treatment Provided   Treatment provided Cognitive-Linquistic      Cognitive-Linquistic Treatment   Treatment focused on Aphasia;Cognition    Skilled Treatment Pt has been completing HEP  packet as instructed. 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 SLP suggested naming/recall tasks to target menu items and descriptors to facilitate improved recall and decrease occurance of word finding errors when asked about menu. Pt noted with phonemic paraphasias x3 this session, with mod visual cues required despite attempted self-corrections. SLP suggested pt highlight errored words when reading aloud descriptions of menu items.      Assessment / Recommendations / Plan   Plan Continue with current plan of care      Progression Toward Goals   Progression toward goals Progressing toward goals              SLP Education - 04/29/21 1436     Education Details functional recall/naming tasks related to job    [employer]) Educated Patient    Methods Explanation;Demonstration;Handout    Comprehension Verbalized understanding;Returned demonstration;Need further instruction              SLP Short Term Goals - 04/29/21 1243       SLP SHORT TERM GOAL #1   Title pt will verbalize daily schedule, recent events and salient information using external memory aids  with rare min A to section  in binder/app in phone, in 3 sessions    Time 3    Period Weeks    Status On-going    Target Date 05/16/21      SLP SHORT TERM GOAL #2   Title pt will read 3-4 sentences technical/academic information and demonstrate understanding by answering "wh"/yes/no questions with 90% success using compensations, in 3 sessions    Time 3    Period Weeks    Status On-going    Target Date 05/16/21      SLP SHORT TERM GOAL #3   Title pt will participate functionally in 10 minutes simple conversation using anomia compensations in 3 sessions    Time 3    Period Weeks    Status On-going    Target Date 05/16/21              SLP Long Term Goals - 04/29/21 1243       SLP LONG TERM GOAL #1   Title pt will  participate functionally in 15 minutes simple-mod complex conversation using anomia compensations in 3 sessions    Time 7    Period Weeks    Status On-going      SLP LONG TERM GOAL #2   Title pt will verbalize daily schedule, recent events and salient information using external memory aids  with rare min A to section in binder/app in phone, in 3 sessions    Time 7    Period Weeks    Status On-going      SLP LONG TERM GOAL #3   Title pt will read 3 paragraphs of technical/academic information and demonstrate understanding by answering "wh"/yes/no questions with 90% success using compensations, in 3 sessions    Time 7    Period Weeks    Status On-going              Plan - 04/29/21 1437     Clinical Impression Statement 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 Dysnomia x3 exhibited this session with semantic and phonemic paraphasias noted, which required mod visual cues to correct. Pt would benefit from skilled ST targeting language and cognition to improve communication effectiveness and optimize return to PLOF.    Speech Therapy Frequency 2x / week    Duration 8 weeks    Treatment/Interventions Environmental controls;Compensatory techniques;Functional tasks;Multimodal communcation approach;SLP instruction and feedback;Cueing hierarchy;Language facilitation;Cognitive reorganization;Patient/family education;Internal/external aids    Potential to Achieve Goals Good    SLP Home Exercise Plan provided    Consulted and Agree with Plan of Care Patient             Patient will benefit from skilled therapeutic intervention in order to improve the following deficits and impairments:   Aphasia  Cognitive communication deficit    Problem List Patient Active Problem List   Diagnosis Date Noted    Acute CVA (cerebrovascular accident) (HCC) 04/12/2021   Dysarthria 04/11/2021   Memory difficulties 04/11/2021    Janann Colonel, MA CCC-SLP 04/29/2021, 2:41 PM   [facility] [address] [place], [place], [address] Phone: [phone]   Fax:  [phone]   Name: Chalese Peach MRN: [id number] Date of Birth: 2001/03/06

## 2021-05-03 ENCOUNTER — Ambulatory Visit: Payer: BC Managed Care – PPO

## 2021-05-03 ENCOUNTER — Other Ambulatory Visit: Payer: Self-pay

## 2021-05-03 DIAGNOSIS — R4701 Aphasia: Secondary | ICD-10-CM | POA: Diagnosis not present

## 2021-05-03 DIAGNOSIS — R41841 Cognitive communication deficit: Secondary | ICD-10-CM

## 2021-05-03 NOTE — Therapy (Signed)
[facility] [address] [place], [place], [address] Phone: [phone]   Fax:  [phone]  Speech Language Pathology Treatment  Patient Details  Name: Meghan Williamson MRN: [id number] Date of Birth: 07-19-2001 Referring Provider (SLP): Joycelyn Das, MD   Encounter Date: 05/03/2021   End of Session - 05/03/21 1017     Visit Number 6    Number of Visits 17    Date for SLP Re-Evaluation 06/15/21    SLP Start Time 1017    SLP Stop Time  1100    SLP Time Calculation (min) 43 min    Activity Tolerance Patient tolerated treatment well             Past Medical History:  Diagnosis Date   Anxiety    Asthma     Past Surgical History:  Procedure Laterality Date   BUBBLE STUDY  04/14/2021   Procedure: BUBBLE STUDY;  Surgeon: Meriam Sprague, MD;  Location: [facility] ENDOSCOPY;  Service: Cardiovascular;;   IR ANGIO EXTERNAL CAROTID SEL EXT CAROTID BILAT MOD SED  04/13/2021       IR ANGIO EXTERNAL CAROTID SEL EXT CAROTID BILAT MOD SED  04/13/2021   IR ANGIO INTRA EXTRACRAN SEL INTERNAL CAROTID BILAT MOD SED  04/13/2021   IR ANGIO VERTEBRAL SEL SUBCLAVIAN INNOMINATE BILAT MOD SED  04/13/2021   IR US GUIDE VASC ACCESS RIGHT  04/13/2021   TEE WITHOUT CARDIOVERSION N/A 04/14/2021   Procedure: TRANSESOPHAGEAL ECHOCARDIOGRAM (TEE);  Surgeon: Meriam Sprague, MD;  Location: [facility] ENDOSCOPY;  Service: Cardiovascular;  Laterality: N/A;    There were no vitals filed for this visit.   Subjective Assessment - 05/03/21 1017     Subjective "I have a hard time remembering the pizzas I don't see a lot"    Currently in Pain? No/denies                   ADULT SLP TREATMENT - 05/03/21 0001       General Information   Behavior/Cognition Alert;Cooperative;Pleasant mood      Treatment Provided   Treatment provided Cognitive-Linquistic      Cognitive-Linquistic Treatment   Treatment focused on Aphasia;Cognition    Skilled  Treatment Pt endorsed difficulty with naming and recall of specific menu items that she does not see often. SLP targeted verbalizing distinct characteristic of less familiar menu items, in which pt named novel characteristic given written description. Pt exhibited improving naming and recall up to ~90% accuracy after training task. Rare phonemic paraphasias x2 and anomia x2 exhibited this session, in which pt able to correct with 50% accuracy. Pt noted with increased difficulty with articulation of multisyllabic words, in which SLP provided HEP to target production of longer words.      Assessment / Recommendations / Plan   Plan Continue with current plan of care      Progression Toward Goals   Progression toward goals Progressing toward goals              SLP Education - 05/03/21 1259     Education Details functional recall/naming task, HEP for multisyllabic words    Person(s) Educated Patient    Methods Explanation;Demonstration;Handout    Comprehension Verbalized understanding;Returned demonstration;Need further instruction              SLP Short Term Goals - 05/03/21 1026       SLP SHORT TERM GOAL #1   Title pt will verbalize daily schedule, recent events and salient  information using external memory aids  with rare min A to section in binder/app in phone, in 3 sessions    Time 2    Period Weeks    Status On-going    Target Date 05/16/21      SLP SHORT TERM GOAL #2   Title pt will read 3-4 sentences technical/academic information and demonstrate understanding by answering "wh"/yes/no questions with 90% success using compensations, in 3 sessions    Time 2    Period Weeks    Status On-going    Target Date 05/16/21      SLP SHORT TERM GOAL #3   Title pt will participate functionally in 10 minutes simple conversation using anomia compensations in 3 sessions    Time 2    Period Weeks    Status On-going    Target Date 05/16/21              SLP Long Term Goals -  05/03/21 1026       SLP LONG TERM GOAL #1   Title pt will participate functionally in 15 minutes simple-mod complex conversation using anomia compensations in 3 sessions    Time 6    Period Weeks    Status On-going      SLP LONG TERM GOAL #2   Title pt will verbalize daily schedule, recent events and salient information using external memory aids  with rare min A to section in binder/app in phone, in 3 sessions    Time 6    Period Weeks    Status On-going      SLP LONG TERM GOAL #3   Title pt will read 3 paragraphs of technical/academic information and demonstrate understanding by answering "wh"/yes/no questions with 90% success using compensations, in 3 sessions    Time 6    Period Weeks    Status On-going              Plan - 05/03/21 1028     Clinical Impression Statement 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 SLP targeted various techniques to aid functional naming/recall of menu items at work. Dysnomia x4 exhibited this session with rare semantic and phonemic paraphasias noted, which required rare min verbal cues to correct. Pt would benefit from skilled ST targeting language and cognition to improve communication effectiveness and optimize return to PLOF.    Speech Therapy Frequency 2x / week    Duration 8 weeks    Treatment/Interventions Environmental controls;Compensatory techniques;Functional tasks;Multimodal communcation approach;SLP instruction and feedback;Cueing hierarchy;Language facilitation;Cognitive reorganization;Patient/family education;Internal/external aids    Potential to Achieve Goals Good    SLP Home Exercise Plan provided    Consulted and Agree with Plan of Care Patient             Patient will benefit from skilled therapeutic intervention in order to improve the following deficits and impairments:   Aphasia  Cognitive communication deficit    Problem  List Patient Active Problem List   Diagnosis Date Noted   Acute CVA (cerebrovascular accident) (HCC) 04/12/2021   Dysarthria 04/11/2021   Memory difficulties 04/11/2021    Janann Colonel, MA CCC-SLP 05/03/2021, 1:00 PM  [facility] [address] [place], [place], [address] Phone: [phone]   Fax:  [phone]   Name: Aerin Delany MRN: [id number] Date of Birth: 2000/12/05

## 2021-05-03 NOTE — Patient Instructions (Signed)
  Roasted Vegetable - Broccoli Greek - balsamic glaze Margarita - basil  Chicken Florentine - grilled chicken [place] - sour cream Rustica - artichoke hearts  Americo - pepperoni and mushrooms [place] [employer] - oregano (think Roque Cash)

## 2021-05-11 ENCOUNTER — Inpatient Hospital Stay: Payer: BC Managed Care – PPO | Admitting: Neurology

## 2021-05-13 ENCOUNTER — Inpatient Hospital Stay: Payer: BC Managed Care – PPO | Attending: Hematology and Oncology

## 2021-05-13 ENCOUNTER — Inpatient Hospital Stay: Payer: BC Managed Care – PPO | Admitting: Hematology and Oncology

## 2021-05-13 ENCOUNTER — Other Ambulatory Visit: Payer: Self-pay

## 2021-05-13 VITALS — BP 127/88 | HR 76 | Temp 98.1°F | Resp 17 | Ht 66.0 in | Wt 125.6 lb

## 2021-05-13 DIAGNOSIS — D6851 Activated protein C resistance: Secondary | ICD-10-CM | POA: Diagnosis not present

## 2021-05-13 DIAGNOSIS — J45909 Unspecified asthma, uncomplicated: Secondary | ICD-10-CM | POA: Diagnosis not present

## 2021-05-13 DIAGNOSIS — I639 Cerebral infarction, unspecified: Secondary | ICD-10-CM | POA: Diagnosis present

## 2021-05-13 DIAGNOSIS — F419 Anxiety disorder, unspecified: Secondary | ICD-10-CM

## 2021-05-13 DIAGNOSIS — D530 Protein deficiency anemia: Secondary | ICD-10-CM

## 2021-05-13 DIAGNOSIS — Z79899 Other long term (current) drug therapy: Secondary | ICD-10-CM

## 2021-05-13 NOTE — Progress Notes (Signed)
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 CT scan showed normal CT scan of brain 04/12/2021: MRI Brain WO showed acute ischemic cortical infarct involving the left temporal occipital and parietal region with additional multifocal acute ischemic infarcts involving the right frontal lobe, right basal ganglia, right temporoccipital region, and cerebellum  04/12/2021: Hypercoagulable panel sent.  This revealed a total protein S of 47 with a functional protein S of 76 and heterozygosity for factor V Leiden 05/13/2021: establish care with Dr. Leonides Schanzorsey  CHIEF COMPLAINTS/PURPOSE OF CONSULTATION:  "Cerebral thrombosis "  HISTORY OF PRESENTING ILLNESS:  Meghan Williamson 20 y.o. female with medical history significant for anxiety and asthma who presents for evaluation of a CVA.   On review of the previous records Meghan Williamson presented to the emergency department on 04/11/2021 with expressive aphasia.  She had a T CT scan which showed a normal scan of the brain.  She suddenly had MRI brain without contrast which showed an acute ischemic cortical infarct involving left temporal occipital and parietal region with additional multifocal acute ischemic infarcts involving the right frontal lobe and right basal ganglia right temporal cortical regions and cerebellum.  Hypercoagulation studies were sent which revealed a protein S total of 47% with a protein S activity of 76%.  Due to concern for the results of the hypercoagulation studies the patient was referred to hematology for further evaluation and management.  On exam today Meghan Williamson reports that her symptoms began when she was driving home and realized that she could not sing along with a song.  She was with her sister.  Her  sister noted that she was "not making much sense".  And saying random words.  The next the next day the patient went to her regular doctor who then sent her to the emergency department where her studies revealed the ischemic stroke.  She notes that her aphasia consisted of forgetting words mixing pronouns.  She also did have a headache on the right side of her head.  On discussion today [name] notes that she has stopped her birth control medication.  She is started this 1 year before.  Her family history is remarkable for a stroke in her personal grandfather breast cancer in her paternal great grandmother but no history of lupus, arthritis, or other major vascular issues.  She notes that she did vape briefly but has subsequently quit.  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  She has had no major surgeries other than the tear duct blockage removal at age 89.  She notes that she is tolerating antiplatelet therapy well without any issues of bleeding.  She reports that her menstrual cycles can be kind of heavy and are not always regular.  She notes that that was while she was on the birth control originally.  She reports that the month before this occurred she did have a relatively heavy menstrual cycle.  She otherwise denies any fevers, chills, sweats, nausea, vomiting or diarrhea.  She is gone through speech therapy and her aphasia has resolved.  A full 10 point ROS is listed below.  MEDICAL HISTORY:  Past Medical History:  Diagnosis Date   Anxiety    Asthma     SURGICAL HISTORY: Past Surgical History:  Procedure Laterality Date  BUBBLE STUDY  04/14/2021   Procedure: BUBBLE STUDY;  Surgeon: Meriam Sprague, MD;  Location: [facility] ENDOSCOPY;  Service: Cardiovascular;;   IR ANGIO EXTERNAL CAROTID SEL EXT CAROTID BILAT MOD SED  04/13/2021       IR ANGIO EXTERNAL CAROTID SEL EXT CAROTID BILAT MOD SED  04/13/2021   IR ANGIO INTRA EXTRACRAN SEL INTERNAL CAROTID BILAT  MOD SED  04/13/2021   IR ANGIO VERTEBRAL SEL SUBCLAVIAN INNOMINATE BILAT MOD SED  04/13/2021   IR US GUIDE VASC ACCESS RIGHT  04/13/2021   TEE WITHOUT CARDIOVERSION N/A 04/14/2021   Procedure: TRANSESOPHAGEAL ECHOCARDIOGRAM (TEE);  Surgeon: Meriam Sprague, MD;  Location: [facility] ENDOSCOPY;  Service: Cardiovascular;  Laterality: N/A;    SOCIAL HISTORY: Social History   Socioeconomic History   Marital status: Single    Spouse name: Not on file   Number of children: Not on file   Years of education: Not on file   Highest education level: Not on file  Occupational History   Not on file  Tobacco Use   Smoking status: Some Days    Pack years: 0.00   Smokeless tobacco: Never  Vaping Use   Vaping Use: Some days  Substance and Sexual Activity   Alcohol use: Never   Drug use: Yes    Types: Marijuana   Sexual activity: Not on file  Other Topics Concern   Not on file  Social History Narrative   Not on file   Social Determinants of Health   Financial Resource Strain: Not on file  Food Insecurity: Not on file  Transportation Needs: Not on file  Physical Activity: Not on file  Stress: Not on file  Social Connections: Not on file  Intimate Partner Violence: Not on file    FAMILY HISTORY: No family history on file.  ALLERGIES:  has No Known Allergies.  MEDICATIONS:  Current Outpatient Medications  Medication Sig Dispense Refill   acetaminophen (TYLENOL) 325 MG tablet 1 tablet as needed for mild pain or headache     albuterol (VENTOLIN HFA) 108 (90 Base) MCG/ACT inhaler 1 puff     cyanocobalamin 1000 MCG tablet Take 1,000 mcg by mouth daily.     aspirin EC 81 MG EC tablet Take 1 tablet (81 mg total) by mouth daily. Swallow whole. 30 tablet 11   atorvastatin (LIPITOR) 10 MG tablet Take 2 tablets (20 mg total) by mouth at bedtime. 90 tablet 2   loratadine (CLARITIN) 5 MG chewable tablet 2 tablets     No current facility-administered medications for this visit.    REVIEW OF  SYSTEMS:   Constitutional: ( - ) fevers, ( - )  chills , ( - ) night sweats Eyes: ( - ) blurriness of vision, ( - ) double vision, ( - ) watery eyes Ears, nose, mouth, throat, and face: ( - ) mucositis, ( - ) sore throat Respiratory: ( - ) cough, ( - ) dyspnea, ( - ) wheezes Cardiovascular: ( - ) palpitation, ( - ) chest discomfort, ( - ) lower extremity swelling Gastrointestinal:  ( - ) nausea, ( - ) heartburn, ( - ) change in bowel habits Skin: ( - ) abnormal skin rashes Lymphatics: ( - ) new lymphadenopathy, ( - ) easy bruising Neurological: ( - ) numbness, ( - ) tingling, ( - ) new weaknesses Behavioral/Psych: ( - ) mood change, ( - ) new changes  All other systems were reviewed with the patient and are negative.  PHYSICAL EXAMINATION:  Vitals:   05/13/21 1315  BP: 127/88  Pulse: 76  Resp: 17  Temp: 98.1 F (36.7 C)  SpO2: 100%   Filed Weights   05/13/21 1315  Weight: 125 lb 9.6 oz (57 kg)    GENERAL: well appearing young Caucasian female in NAD  SKIN: skin color, texture, turgor are normal, no rashes or significant lesions EYES: conjunctiva are pink and non-injected, sclera clear LUNGS: clear to auscultation and percussion with normal breathing effort HEART: regular rate & rhythm and no murmurs and no lower extremity edema Musculoskeletal: no cyanosis of digits and no clubbing  PSYCH: alert & oriented x 3, fluent speech NEURO: no focal motor/sensory deficits. Normal speech/word usage  LABORATORY DATA:  I have reviewed the data as listed CBC Latest Ref Rng & Units 04/11/2021  WBC 4.0 - 10.5 K/uL 4.8  Hemoglobin 12.0 - 15.0 g/dL 16.1  Hematocrit 09.6 - 46.0 % 38.7  Platelets 150 - 400 K/uL 266    CMP Latest Ref Rng & Units 04/11/2021  Glucose 70 - 99 mg/dL 87  BUN 6 - 20 mg/dL 10  Creatinine 0.45 - 4.09 mg/dL 8.11  Sodium 914 - 782 mmol/L 137  Potassium 3.5 - 5.1 mmol/L 4.0  Chloride 98 - 111 mmol/L 105  CO2 22 - 32 mmol/L 24  Calcium 8.9 - 10.3 mg/dL 9.7   Total Protein 6.5 - 8.1 g/dL 7.2  Total Bilirubin 0.3 - 1.2 mg/dL 0.4  Alkaline Phos 38 - 126 U/L 45  AST 15 - 41 U/L 12(L)  ALT 0 - 44 U/L 9    RADIOGRAPHIC STUDIES: VAS [place] GROIN PSEUDOANEURYSM  Result Date: 04/22/2021  ARTERIAL PSEUDOANEURYSM  Patient Name:  MATILDA FLEIG  Date of Exam:   04/22/2021 Medical Rec #: [id number]   Accession #:    [id number] Date of Birth: May 05, 2001   Patient Gender: F Patient Age:   19Y Exam Location:  [facility] Procedure:      VAS [place] Bobetta Lime Referring Phys: [id number] Farris Has --------------------------------------------------------------------------------  Exam: Right groin Indications: Patient complains of groin pain and bruising. History: S/p catheterization 04/13/21. Performing Technologist: Jeb Levering RDMS, RVT  Examination Guidelines: A complete evaluation includes B-mode imaging, spectral Doppler, color Doppler, and power Doppler as needed of all accessible portions of each vessel. Bilateral testing is considered an integral part of a complete examination. Limited examinations for reoccurring indications may be performed as noted.  Summary: No evidence of pseudoaneurysm, AVF or DVT Appears to be thrombus at the anterior wall of the right common femoral artery.  Diagnosing physician: Fabienne Bruns MD Electronically signed by Fabienne Bruns MD on 04/22/2021 at 4:44:35 PM.   --------------------------------------------------------------------------------    Final    VAS [place] LOWER EXTREMITY VENOUS (DVT)  Result Date: 04/22/2021  Lower Venous DVT Study Patient Name:  LAWSYN HEILER  Date of Exam:   04/22/2021 Medical Rec #: [id number]   Accession #:    [id number] Date of Birth: April 25, 2001   Patient Gender: F Patient Age:   19Y Exam Location:  [facility] Procedure:      VAS [place] LOWER EXTREMITY VENOUS (DVT) Referring Phys: [id number] Farris Has --------------------------------------------------------------------------------   Indications: Right groin pain and bruising post cath 04/13/21.  Performing Technologist: Jeb Levering RDMS, RVT  Examination Guidelines: A complete evaluation includes B-mode imaging, spectral Doppler, color Doppler, and power Doppler as needed of all accessible portions of each vessel. Bilateral testing is considered an integral part of a complete examination. Limited examinations  for reoccurring indications may be performed as noted. The reflux portion of the exam is performed with the patient in reverse Trendelenburg.  +---------+---------------+---------+-----------+----------+--------------+ RIGHT    CompressibilityPhasicitySpontaneityPropertiesThrombus Aging +---------+---------------+---------+-----------+----------+--------------+ CFV      Full           Yes      Yes                                 +---------+---------------+---------+-----------+----------+--------------+ SFJ      Full           Yes      Yes                                 +---------+---------------+---------+-----------+----------+--------------+ FV Prox  Full                                                        +---------+---------------+---------+-----------+----------+--------------+ FV Mid   Full                                                        +---------+---------------+---------+-----------+----------+--------------+ FV DistalFull                                                        +---------+---------------+---------+-----------+----------+--------------+ PFV      Full                                                        +---------+---------------+---------+-----------+----------+--------------+     Summary: RIGHT: - No evidence of deep vein thrombosis in the right thigh.   *See table(s) above for measurements and observations. Electronically signed by Fabienne Bruns MD on 04/22/2021 at 4:43:05 PM.    Final     ASSESSMENT & PLAN Meghan Williamson 20 y.o. female with medical  history significant for anxiety and asthma who presents for evaluation of a CVA.   After review of the labs, review of the records, and discussion with the patient the patients findings are most consistent with an idiopathic CVA.  After review the labs there is no convincing findings with her blood work that there is a hypercoagulation issue at play.  Factor V Leiden heterozygosity is not typically associated with arterial thromboses.  Also the protein S levels tend to be decreased in times of acute thrombosis but increased after the acute event.  Repeat studies of this will reveal whether or not there is a genuine protein S deficiency.  At this time we would recommend management per the recommendations of neurology.  There is no clear indication for systemic anticoagulation therapy from our perspective.  We will repeat the protein S studies today in order to assess for genuine deficiency.  Hypercoagulation panel 04/12/2021  Antithrombin III-92% Protein C 100%, Activity 85% Protein S activity 76%, Total 47% Lupus anticoagulant panel: negative Beta 2 glycoprotein IgG, IgA, IgM: <9 FVL: heterozygous mutation Prothrombin gene mutation: negative Anticardiolipin IgG, IgM, IgA: <9 Homocysteine 13.2  An association between FVL and arterial thromboembolism remains controversial and is likely to be relatively small if present. Large studies have failed to demonstrate that protein S deficiency is a risk factor for arterial thrombosis.   # Cerebral Vascular Accident #Heterozygous Factor V Leiden # Mild Protein S Deficiency --At this time have a very low suspicion that these abnormalities in the hypercoagulation panel are the cause of her CVA --No indication for systemic anticoagulation at this time.  Continue antiplatelet therapy per the recommendations of neurology --We will repeat protein S panel in order to assess if this was a genuine deficiency --No need for routine follow-up in our clinic if the  above studies are normal.   Orders Placed This Encounter  Procedures   PROTEIN S PANEL, Total, Free, Functional Protein S    Standing Status:   Future    Number of Occurrences:   1    Standing Expiration Date:   05/13/2022    All questions were answered. The patient knows to call the clinic with any problems, questions or concerns.  A total of more than 60 minutes were spent on this encounter with face-to-face time and non-face-to-face time, including preparing to see the patient, ordering tests and/or medications, counseling the patient and coordination of care as outlined above.   Ulysees Barns, MD Department of Hematology/Oncology [facility] Phone: [phone] Pager: [phone] Email: [email or web address]  05/20/2021 6:28 PM

## 2021-05-14 ENCOUNTER — Ambulatory Visit: Payer: BC Managed Care – PPO | Attending: Internal Medicine

## 2021-05-14 DIAGNOSIS — R41841 Cognitive communication deficit: Secondary | ICD-10-CM | POA: Diagnosis present

## 2021-05-14 DIAGNOSIS — R4701 Aphasia: Secondary | ICD-10-CM | POA: Diagnosis present

## 2021-05-14 LAB — PROTEIN S PANEL
Protein S Activity: 59 % — ABNORMAL LOW (ref 63–140)
Protein S Ag, Free: 82 % (ref 61–136)
Protein S Ag, Total: 66 % (ref 60–150)

## 2021-05-14 NOTE — Therapy (Signed)
[place] [address] [place], [place], [address] Phone: [phone]   Fax:  [phone]  Speech Language Pathology Treatment  Patient Details  Name: Meghan Williamson MRN: [id number] Date of Birth: 2001/05/01 Referring Provider (SLP): Flora Lipps, MD   Encounter Date: 05/14/2021   End of Session - 05/14/21 1316     Visit Number 7    Number of Visits 17    Date for SLP Re-Evaluation 06/15/21    SLP Start Time 1103    SLP Stop Time  1145    SLP Time Calculation (min) 42 min    Activity Tolerance Patient tolerated treatment well             Past Medical History:  Diagnosis Date   Anxiety    Asthma     Past Surgical History:  Procedure Laterality Date   BUBBLE STUDY  04/14/2021   Procedure: BUBBLE STUDY;  Surgeon: Freada Bergeron, MD;  Location: [facility];  Service: Cardiovascular;;   IR ANGIO EXTERNAL CAROTID SEL EXT CAROTID BILAT MOD SED  04/13/2021       IR ANGIO EXTERNAL CAROTID SEL EXT CAROTID BILAT MOD SED  04/13/2021   IR ANGIO INTRA EXTRACRAN SEL INTERNAL CAROTID BILAT MOD SED  04/13/2021   IR ANGIO VERTEBRAL SEL SUBCLAVIAN INNOMINATE BILAT MOD SED  04/13/2021   IR US GUIDE VASC ACCESS RIGHT  04/13/2021   TEE WITHOUT CARDIOVERSION N/A 04/14/2021   Procedure: TRANSESOPHAGEAL ECHOCARDIOGRAM (TEE);  Surgeon: Freada Bergeron, MD;  Location: [facility] ENDOSCOPY;  Service: Cardiovascular;  Laterality: N/A;    There were no vitals filed for this visit.   Subjective Assessment - 05/14/21 1116     Subjective "[place] was awesome!"    Currently in Pain? No/denies                   ADULT SLP TREATMENT - 05/14/21 1121       General Information   Behavior/Cognition Alert;Cooperative;Pleasant mood      Treatment Provided   Treatment provided Cognitive-Linquistic      Cognitive-Linquistic Treatment   Treatment focused on Aphasia;Cognition    Skilled Treatment (speech tx individual) Pt read  multi-syllabic words (3 -4 syllable) with 99% success ("librarian" was difficult). "I still every once in a while say a funny word, or I can't think of the word I want." Meghan Williamson told SLP she mostly uses synonym or circumlocution strategies if she has difficulty with word-finding. SLP engaged pt in 24 minutes mod-complex conversation to assess ability with Meghan Williamson's spoken language. SLP had pt describe her villa in [place], describe the meals she ate, describe why she wants to stay with hostess and expo at work, and discuss how she chooses to take notes with paper or on her laptop at school - without dysnomia or aonmia. 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 SLP suggested she put the date/time into her phone calendar. Pt was concerned she may forget to go take care of the dogs and reasoned she could put an alarm in her phone to assist with this. Meghan Williamson told SLP she continues to ask her mother when ST  appointments are so pt's homework is to put ST visits into her phone.      Assessment / Recommendations / Plan   Plan Continue with current plan of care      Progression Toward Goals   Progression toward goals Progressing toward goals              SLP Education - 05/14/21 1315     Education Details putting things to remember in her calendar, setting alarms    Person(s) Educated Patient    Methods Explanation    Comprehension Verbalized understanding              SLP Short Term Goals - 05/14/21 1317       SLP SHORT TERM GOAL #1   Title pt will verbalize daily schedule, recent events and salient information using external memory aids  with rare min A to section in binder/app in phone,  in 3 sessions    Baseline 05-14-21    Status Partially Met    Target Date 05/16/21      SLP SHORT TERM GOAL #2   Title pt will read 3-4 sentences technical/academic information and demonstrate understanding by answering "wh"/yes/no questions with 90% success using compensations, in 3 sessions    Status Unable to assess    Target Date 05/16/21      SLP SHORT TERM GOAL #3   Title pt will participate functionally in 10 minutes simple conversation using anomia compensations in 3 sessions    Baseline 05-14-21    Status Partially Met    Target Date 05/16/21              SLP Long Term Goals - 05/14/21 1318       SLP LONG TERM GOAL #1   Title pt will participate functionally in 15 minutes simple-mod complex conversation using anomia compensations in 3 sessions    Baseline 05-14-21    Time 5    Period Weeks    Status On-going    Target Date 06/15/21      SLP LONG TERM GOAL #2   Title pt will verbalize daily schedule, recent events and salient information using external memory aids  with rare min A to section in binder/app in phone, in 3 sessions    Time 5    Period Weeks    Status On-going    Target Date 06/15/21      SLP LONG TERM GOAL #3   Title pt will read 3 paragraphs of technical/academic information and demonstrate understanding by answering "wh"/yes/no questions with 90% success using compensations, in 3 sessions    Time 5    Period Weeks    Status On-going              Plan - 05/14/21 1316     Clinical Impression Statement 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 Pt would benefit from skilled ST targeting language and cognition to improve communication effectiveness and optimize return to PLOF.    Speech Therapy Frequency 2x / week    Duration 8 weeks    Treatment/Interventions Environmental controls;Compensatory techniques;Functional tasks;Multimodal communcation  approach;SLP instruction and feedback;Cueing hierarchy;Language facilitation;Cognitive reorganization;Patient/family education;Internal/external aids    Potential to Achieve Goals Good    SLP Home Exercise Plan provided    Consulted and Agree with Plan of Care Patient             Patient will benefit from skilled therapeutic  intervention in order to improve the following deficits and impairments:   Aphasia  Cognitive communication deficit    Problem List Patient Active Problem List   Diagnosis Date Noted   Acute CVA (cerebrovascular accident) ([place]) 04/12/2021   Dysarthria 04/11/2021   Memory difficulties 04/11/2021    [place] ,[place], [place]  05/14/2021, 1:25 PM  [place] [address] [place], [place], [address] Phone: [phone]   Fax:  [phone]   Name: Meghan Williamson MRN: [id number] Date of Birth: 02-20-2001

## 2021-05-18 ENCOUNTER — Ambulatory Visit: Payer: BC Managed Care – PPO

## 2021-05-18 ENCOUNTER — Other Ambulatory Visit: Payer: Self-pay

## 2021-05-18 DIAGNOSIS — R41841 Cognitive communication deficit: Secondary | ICD-10-CM

## 2021-05-18 DIAGNOSIS — R4701 Aphasia: Secondary | ICD-10-CM | POA: Diagnosis not present

## 2021-05-18 NOTE — Therapy (Signed)
[facility] [address] [place], [place], [address] Phone: [phone]   Fax:  [phone]  Speech Language Pathology Treatment  Patient Details  Name: Meghan Williamson MRN: [id number] Date of Birth: 2001-02-25 Referring Provider (SLP): Flora Lipps, MD   Encounter Date: 05/18/2021   End of Session - 05/18/21 1722     Visit Number 8    Number of Visits 17    Date for SLP Re-Evaluation 06/15/21    SLP Start Time 18    SLP Stop Time  1100    SLP Time Calculation (min) 40 min    Activity Tolerance Patient tolerated treatment well             Past Medical History:  Diagnosis Date   Anxiety    Asthma     Past Surgical History:  Procedure Laterality Date   BUBBLE STUDY  04/14/2021   Procedure: BUBBLE STUDY;  Surgeon: Freada Bergeron, MD;  Location: [facility];  Service: Cardiovascular;;   IR ANGIO EXTERNAL CAROTID SEL EXT CAROTID BILAT MOD SED  04/13/2021       IR ANGIO EXTERNAL CAROTID SEL EXT CAROTID BILAT MOD SED  04/13/2021   IR ANGIO INTRA EXTRACRAN SEL INTERNAL CAROTID BILAT MOD SED  04/13/2021   IR ANGIO VERTEBRAL SEL SUBCLAVIAN INNOMINATE BILAT MOD SED  04/13/2021   IR US GUIDE VASC ACCESS RIGHT  04/13/2021   TEE WITHOUT CARDIOVERSION N/A 04/14/2021   Procedure: TRANSESOPHAGEAL ECHOCARDIOGRAM (TEE);  Surgeon: Freada Bergeron, MD;  Location: [facility];  Service: Cardiovascular;  Laterality: N/A;    There were no vitals filed for this visit.   Subjective Assessment - 05/18/21 1027     Subjective "Sometimes I just zone out."    Currently in Pain? No/denies                   ADULT SLP TREATMENT - 05/18/21 1033       General Information   Behavior/Cognition Alert;Cooperative;Pleasant mood      Treatment Provided   Treatment provided Cognitive-Linquistic      Cognitive-Linquistic Treatment   Treatment focused on Aphasia;Cognition    Skilled Treatment Pt told SLP "I just zoned  out when people were talking around me last night." Therefore SLP assisted pt in thinking of some copmensations for school lectures. Pt stated she would have taken more notes as well as recorded lecture if she would have been tempted to not pay close attention during the lecture. SLP encouraged pt that she may need to do this more often in the fall. SLP also inofrmed pt she may want to have a conversation/start a conversation with pt resource services to explore use of accomodations/modifications to assignments or class requirements while she cont to heal from her CVA. Pt homework to write notes for two 20 minute podcasts or [employer].      Assessment / Recommendations / Plan   Plan Continue with current plan of care      Progression Toward Goals   Progression toward goals Progressing toward goals              SLP Education - 05/18/21 1721     Education Details recording lectures, contact student support services at [employer]) Educated Patient    Methods Explanation    Comprehension Verbalized understanding              SLP Short Term Goals - 05/18/21 1723  SLP SHORT TERM GOAL #1   Title pt will verbalize daily schedule, recent events and salient information using external memory aids  with rare min A to section in binder/app in phone, in 3 sessions    Baseline 05-14-21    Status Partially Met    Target Date 05/16/21      SLP SHORT TERM GOAL #2   Title pt will read 3-4 sentences technical/academic information and demonstrate understanding by answering "wh"/yes/no questions with 90% success using compensations, in 3 sessions    Status Unable to assess    Target Date 05/16/21      SLP SHORT TERM GOAL #3   Title pt will participate functionally in 10 minutes simple conversation using anomia compensations in 3 sessions    Baseline 05-14-21    Status Partially Met    Target Date 05/16/21              SLP Long Term Goals - 05/18/21 1723       SLP LONG  TERM GOAL #1   Title pt will participate functionally in 15 minutes simple-mod complex conversation using anomia compensations in 3 sessions    Baseline 05-14-21, 05-18-21    Time 5    Period Weeks    Status On-going    Target Date 06/15/21      SLP LONG TERM GOAL #2   Title pt will verbalize daily schedule, recent events and salient information using external memory aids  with rare min A to section in binder/app in phone, in 3 sessions    Baseline 05-18-21 (next session, and last-scheduled session for ST)    Time 5    Period Weeks    Status On-going    Target Date 06/15/21      SLP LONG TERM GOAL #3   Title pt will read 3 paragraphs of technical/academic information and demonstrate understanding by answering "wh"/yes/no questions with 90% success using compensations, in 3 sessions    Time 5    Period Weeks    Status On-going    Target Date 06/15/21      SLP LONG TERM GOAL #4   Title pt will use compensations for comprehension of long sections of auditory information to demonstrate understanding, in 3 sessions    Time 4    Period Weeks    Status New    Target Date 06/15/21              Plan - 05/18/21 1722     Clinical Impression Statement Pt presents with improved anomic expressive aphasia, and mild memory deficits likely based in attention. Pt would benefit from skilled ST targeting language and compensations to alleviate effects of her decr'd cognition to improve communication effectiveness and optimize return to PLOF.    Speech Therapy Frequency 2x / week    Duration 8 weeks    Treatment/Interventions Environmental controls;Compensatory techniques;Functional tasks;Multimodal communcation approach;SLP instruction and feedback;Cueing hierarchy;Language facilitation;Cognitive reorganization;Patient/family education;Internal/external aids    Potential to Achieve Goals Good    SLP Home Exercise Plan provided    Consulted and Agree with Plan of Care Patient              Patient will benefit from skilled therapeutic intervention in order to improve the following deficits and impairments:   Cognitive communication deficit  Aphasia    Problem List Patient Active Problem List   Diagnosis Date Noted   Acute CVA (cerebrovascular accident) ([facility]) 04/12/2021   Dysarthria 04/11/2021   Memory difficulties 04/11/2021  [facility] ,[place], [place]  05/18/2021, 5:26 PM  [facility] [address] [place], [place], [address] Phone: [phone]   Fax:  [phone]   Name: Kenyotta Dorfman MRN: [id number] Date of Birth: 2001/01/23

## 2021-05-18 NOTE — Patient Instructions (Signed)
  Please complete the assigned speech therapy homework prior to your next session and return it to the speech therapist at your next visit.  

## 2021-05-20 ENCOUNTER — Ambulatory Visit: Payer: BC Managed Care – PPO

## 2021-05-20 ENCOUNTER — Other Ambulatory Visit: Payer: Self-pay

## 2021-05-20 DIAGNOSIS — R4701 Aphasia: Secondary | ICD-10-CM

## 2021-05-20 DIAGNOSIS — R41841 Cognitive communication deficit: Secondary | ICD-10-CM

## 2021-05-20 NOTE — Therapy (Signed)
[facility] [address] [place], [place], [address] Phone: [phone]   Fax:  [phone]  Speech Language Pathology Treatment  Patient Details  Name: Meghan Williamson MRN: [id number] Date of Birth: [birth year] Referring Provider (SLP): Flora Lipps, MD   Encounter Date: 05/20/2021   End of Session - 05/20/21 1150     Visit Number 9    Number of Visits 17    Date for SLP Re-Evaluation 06/15/21    SLP Start Time 1150    SLP Stop Time  1230    SLP Time Calculation (min) 40 min    Activity Tolerance Patient tolerated treatment well             Past Medical History:  Diagnosis Date   Anxiety    Asthma     Past Surgical History:  Procedure Laterality Date   BUBBLE STUDY  04/14/2021   Procedure: BUBBLE STUDY;  Surgeon: Freada Bergeron, MD;  Location: [facility];  Service: Cardiovascular;;   IR ANGIO EXTERNAL CAROTID SEL EXT CAROTID BILAT MOD SED  04/13/2021       IR ANGIO EXTERNAL CAROTID SEL EXT CAROTID BILAT MOD SED  04/13/2021   IR ANGIO INTRA EXTRACRAN SEL INTERNAL CAROTID BILAT MOD SED  04/13/2021   IR ANGIO VERTEBRAL SEL SUBCLAVIAN INNOMINATE BILAT MOD SED  04/13/2021   IR US GUIDE VASC ACCESS RIGHT  04/13/2021   TEE WITHOUT CARDIOVERSION N/A 04/14/2021   Procedure: TRANSESOPHAGEAL ECHOCARDIOGRAM (TEE);  Surgeon: Freada Bergeron, MD;  Location: [facility] ENDOSCOPY;  Service: Cardiovascular;  Laterality: N/A;    There were no vitals filed for this visit.   Subjective Assessment - 05/20/21 1150     Subjective "I have trouble focusing on people in a group conversation"    Currently in Pain? No/denies                   ADULT SLP TREATMENT - 05/20/21 1149       General Information   Behavior/Cognition Alert;Cooperative;Pleasant mood      Treatment Provided   Treatment provided Cognitive-Linquistic      Cognitive-Linquistic Treatment   Treatment focused on Aphasia;Cognition    Skilled  Treatment Pt indicates difficulty focusing, although some baseline attention deficits reported prior to CVA. Pt recalled HEP and recommended compensations from previous ST session (record lectures & take notes). Pt took copious notes on 6 minute Ted Talk and used pause/rewind if attention waned. Pt reports her mom called school re: accomodations. SLP targeted auditory recall and attention to 2-3 paragraph stories read aloud, in pt answered WH-questions with 75% accuracy. SLP then had pt read stories aloud then highlight key words/details. Pt then answered [facility]- questions with 81% accuracy. SLP educated patient on compensations for highlighting key words as pt endorses "missing details" and printing out slides and highlighting/writing on slides to aid attention. Pt verbalized understanding.      Assessment / Recommendations / Plan   Plan Continue with current plan of care      Progression Toward Goals   Progression toward goals Progressing toward goals              SLP Education - 05/20/21 1232     Education Details longer TedTalk for HEP, higlight key words/details, print slides and write on slides    Person(s) Educated Patient    Methods Explanation;Demonstration    Comprehension Verbalized understanding;Returned demonstration;Need further instruction  SLP Short Term Goals - 05/18/21 1723       SLP SHORT TERM GOAL #1   Title pt will verbalize daily schedule, recent events and salient information using external memory aids  with rare min A to section in binder/app in phone, in 3 sessions    Baseline 05-14-21    Status Partially Met    Target Date 05/16/21      SLP SHORT TERM GOAL #2   Title pt will read 3-4 sentences technical/academic information and demonstrate understanding by answering "wh"/yes/no questions with 90% success using compensations, in 3 sessions    Status Unable to assess    Target Date 05/16/21      SLP SHORT TERM GOAL #3   Title pt will participate  functionally in 10 minutes simple conversation using anomia compensations in 3 sessions    Baseline 05-14-21    Status Partially Met    Target Date 05/16/21              SLP Long Term Goals - 05/20/21 1150       SLP LONG TERM GOAL #1   Title pt will participate functionally in 15 minutes simple-mod complex conversation using anomia compensations in 3 sessions    Baseline 05-14-21, 05-18-21, 05-20-21    Time --    Period --    Status Achieved    Target Date 06/15/21      SLP LONG TERM GOAL #2   Title pt will verbalize daily schedule, recent events and salient information using external memory aids  with rare min A to section in binder/app in phone, in 3 sessions    Baseline 05-18-21 (next session, and last-scheduled session for ST), 05-20-21    Time 5    Period Weeks    Status On-going    Target Date 06/15/21      SLP LONG TERM GOAL #3   Title pt will read 3 paragraphs of technical/academic information and demonstrate understanding by answering "wh"/yes/no questions with 90% success using compensations, in 3 sessions    Time 5    Period Weeks    Status On-going    Target Date 06/15/21      SLP LONG TERM GOAL #4   Title pt will use compensations for comprehension of long sections of auditory information to demonstrate understanding, in 3 sessions    Time 4    Period Weeks    Status New    Target Date 06/15/21              Plan - 05/20/21 1422     Clinical Impression Statement Pt presents with improved anomic expressive aphasia, and mild memory deficits likely based in attention. Ongoing education and training of compensations conducted to increase attention and recall of information learned in college level classes. Pt would benefit from skilled ST targeting language and compensations to alleviate effects of her decr'd cognition to improve communication effectiveness and optimize return to PLOF.    Speech Therapy Frequency 2x / week    Duration 8 weeks     Treatment/Interventions Environmental controls;Compensatory techniques;Functional tasks;Multimodal communcation approach;SLP instruction and feedback;Cueing hierarchy;Language facilitation;Cognitive reorganization;Patient/family education;Internal/external aids    Potential to Achieve Goals Good    SLP Home Exercise Plan provided    Consulted and Agree with Plan of Care Patient             Patient will benefit from skilled therapeutic intervention in order to improve the following deficits and impairments:   Cognitive communication deficit  Aphasia    Problem List Patient Active Problem List   Diagnosis Date Noted   Acute CVA (cerebrovascular accident) ([place]) 04/12/2021   Dysarthria 04/11/2021   Memory difficulties 04/11/2021    Alinda Deem, [place] CCC-SLP 05/20/2021, 2:30 PM  [facility] [address] [place], [place], [address] Phone: [phone]   Fax:  [phone]   Name: Meghan Williamson MRN: [id number] Date of Birth: 04/06/2001

## 2021-05-21 NOTE — Progress Notes (Signed)
Contacted pt to let her know per Dr Leonides Schanz: Please let Mrs. Meghan Williamson know that her Protein S levels have improved. I have a very low suspicion that the Protein S or Factor 5 Leiden were the cause of her stroke. Recommend she continue to follow with the recommendations of neurology. There is no need for routine f/u in our clinic.     Pt acknowledged and verbalized understanding.

## 2021-05-24 ENCOUNTER — Encounter: Payer: Self-pay | Admitting: Neurology

## 2021-05-24 ENCOUNTER — Ambulatory Visit: Payer: BC Managed Care – PPO | Admitting: Neurology

## 2021-05-24 VITALS — BP 120/84 | HR 86 | Ht 66.0 in | Wt 124.0 lb

## 2021-05-24 DIAGNOSIS — R4701 Aphasia: Secondary | ICD-10-CM | POA: Diagnosis not present

## 2021-05-24 DIAGNOSIS — I639 Cerebral infarction, unspecified: Secondary | ICD-10-CM | POA: Diagnosis not present

## 2021-05-24 MED ORDER — CEREFOLIN 6-1-50-5 MG PO TABS: 1.0000 | ORAL_TABLET | ORAL | 3 refills | Status: AC

## 2021-05-24 NOTE — Patient Instructions (Addendum)
I had a long discussion the patient and her mother regarding her recent cryptogenic stroke and discuss results of imaging studies, lab work and answered questions.  I recommend she continue ongoing speech therapy for her aphasia which appears to be improving.  Continue aspirin for stroke prevention and maintain aggressive risk factor modification with strict control of hyperlipidemia with LDL goal below 70 mg percent, hypertension with goal below 130/90 and diabetes with hemoglobin A1c goal below 6.5%.  I also advised her to not take any estrogen containing birth control pills as well as quit smoking completely.  I recommend we check transcranial Doppler bubble study to look for PFO which may have been missed as TEE was suboptimal without acute Valsalva and she was sedated.  She had transient low protein S which was likely an acute phase reactant.  She has MHTR mutation for hyperhomocystinemia but in the presence of normal homocystine levels this is of unclear significance.  Continue vitamin B 12 supplementation but change to Cerfolin nac due to her MHTR mutation .She also has factor V Leiden but no definite history of DVT hence agree with continuing antiplatelet therapy for now.  Check repeat MRI scan of the brain and MRA of the brain for follow-up.  She will return for follow-up in 3 months or call earlier if necessary. Stroke Prevention Some medical conditions and behaviors are associated with a higher chance of having a stroke. You can help prevent a stroke by making nutrition, lifestyle,and other changes, including managing any medical conditions you may have. What nutrition changes can be made?  Eat healthy foods. You can do this by: Choosing foods high in fiber, such as fresh fruits and vegetables and whole grains. Eating at least 5 or more servings of fruits and vegetables a day. Try to fill half of your plate at each meal with fruits and vegetables. Choosing lean protein foods, such as lean cuts of  meat, poultry without skin, fish, tofu, beans, and nuts. Eating low-fat dairy products. Avoiding foods that are high in salt (sodium). This can help lower blood pressure. Avoiding foods that have saturated fat, trans fat, and cholesterol. This can help prevent high cholesterol. Avoiding processed and premade foods. Follow your health care provider's specific guidelines for losing weight, controlling high blood pressure (hypertension), lowering high cholesterol, and managing diabetes. These may include: Reducing your daily calorie intake. Limiting your daily sodium intake to 1,500 milligrams (mg). Using only healthy fats for cooking, such as olive oil, canola oil, or sunflower oil. Counting your daily carbohydrate intake. What lifestyle changes can be made? Maintain a healthy weight. Talk to your health care provider about your ideal weight. Get at least 30 minutes of moderate physical activity at least 5 days a week. Moderate activity includes brisk walking, biking, and swimming. Do not use any products that contain nicotine or tobacco, such as cigarettes and e-cigarettes. If you need help quitting, ask your health care provider. It may also be helpful to avoid exposure to secondhand smoke. Limit alcohol intake to no more than 1 drink a day for nonpregnant women and 2 drinks a day for men. One drink equals 12 oz of beer, 5 oz of wine, or 1 oz of hard liquor. Stop any illegal drug use. Avoid taking birth control pills. Talk to your health care provider about the risks of taking birth control pills if: You are over 63 years old. You smoke. You get migraines. You have ever had a blood clot. What other changes can  be made? Manage your cholesterol levels. Eating a healthy diet is important for preventing high cholesterol. If cholesterol cannot be managed through diet alone, you may also need to take medicines. Take any prescribed medicines to control your cholesterol as told by your health care  provider. Manage your diabetes. Eating a healthy diet and exercising regularly are important parts of managing your blood sugar. If your blood sugar cannot be managed through diet and exercise, you may need to take medicines. Take any prescribed medicines to control your diabetes as told by your health care provider. Control your hypertension. To reduce your risk of stroke, try to keep your blood pressure below 130/80. Eating a healthy diet and exercising regularly are an important part of controlling your blood pressure. If your blood pressure cannot be managed through diet and exercise, you may need to take medicines. Take any prescribed medicines to control hypertension as told by your health care provider. Ask your health care provider if you should monitor your blood pressure at home. Have your blood pressure checked every year, even if your blood pressure is normal. Blood pressure increases with age and some medical conditions. Get evaluated for sleep disorders (sleep apnea). Talk to your health care provider about getting a sleep evaluation if you snore a lot or have excessive sleepiness. Take over-the-counter and prescription medicines only as told by your health care provider. Aspirin or blood thinners (antiplatelets or anticoagulants) may be recommended to reduce your risk of forming blood clots that can lead to stroke. Make sure that any other medical conditions you have, such as atrial fibrillation or atherosclerosis, are managed. What are the warning signs of a stroke? The warning signs of a stroke can be easily remembered as BEFAST. B is for balance. Signs include: Dizziness. Loss of balance or coordination. Sudden trouble walking. E is for eyes. Signs include: A sudden change in vision. Trouble seeing. F is for face. Signs include: Sudden weakness or numbness of the face. The face or eyelid drooping to one side. A is for arms. Signs include: Sudden weakness or numbness of the  arm, usually on one side of the body. S is for speech. Signs include: Trouble speaking (aphasia). Trouble understanding. T is for time. These symptoms may represent a serious problem that is an emergency. Do not wait to see if the symptoms will go away. Get medical help right away. Call your local emergency services (911 in the [place] Do not drive yourself to the hospital. Other signs of stroke may include: A sudden, severe headache with no known cause. Nausea or vomiting. Seizure. Where to find more information For more information, visit: American Stroke Association: www.strokeassociation.org National Stroke Association: www.stroke.org Summary You can prevent a stroke by eating healthy, exercising, not smoking, limiting alcohol intake, and managing any medical conditions you may have. Do not use any products that contain nicotine or tobacco, such as cigarettes and e-cigarettes. If you need help quitting, ask your health care provider. It may also be helpful to avoid exposure to secondhand smoke. Remember BEFAST for warning signs of stroke. Get help right away if you or a loved one has any of these signs. This information is not intended to replace advice given to you by your health care provider. Make sure you discuss any questions you have with your healthcare provider. Document Revised: 10/13/2017 Document Reviewed: 12/06/2016 Elsevier Patient Education  2021 [employer].

## 2021-05-24 NOTE — Progress Notes (Signed)
[facility] [address] [place]. [place] [address] [phone]       OFFICE CONSULT NOTE  [name] Date of Birth:  03/24/2001 Medical Record Number:  [id number]   Referring MD:  Marvel Plan  Reason for Referral:  stroke  HPI: [name] is a pleasant 20 year old Caucasian girl seen today for initial office consultation visit for stroke.  History is obtained from the patient and her mother who is accompanying her as well as review of electronic medical records.  I personally reviewed available imaging films in PACS.[name] is a 20 y.o. female with a medical history significant for asthma, intermittent seasonal allergies, and occasional marijuana and tobacco use who presented to the ED on 5/29 for evaluation of expressive aphasia. Patient states that she was driving home from work at around 02:00 on 5/29 when she had a mild left frontal headache. She arrived home from work and shortly after, her sister was speaking with her and noticed that she was having trouble speaking. Her speech was described as gibberish and unintelligible and somewhat dysarthric. [name] attempted to text her mother at that time with a text message that did not make sense. She went to bed and when she woke up, she was able to communicate with her mother until her mother began asking her questions about her schedule and noticed that she had some residual word-finding difficulties. She was then taken to urgent care and sent to the ED for further evaluation. [name] takes OCP medication for management of her menstrual cycle and took an ibuprofen with her headache but denies taking other medications.  CT scan of the head on admission showed no acute abnormality but MRI showed a moderate sized left MCA, right PCA medial temporal, right cerebellar and right frontal punctate acute infarcts.  MR angiogram showed subtle diffuse small vessel irregularity in the intracranial circulation.  Patient underwent  diagnostic cerebral catheter angiogram on 04/13/2021 but it did not show evidence of vasculitis, spasm or stenosis or aneurysms.  Spinal tap was also performed which was unremarkable without any elevated cells or abnormal protein or glucose.  2D echo showed ejection fraction of 60 to 65%.  Lower EXTR Dopplers were negative for DVT.  Urine drug screen was positive for marijuana.  Transesophageal echocardiogram was performed which showed no evidence of PFO or cardiac source of embolism.  LDL cholesterol was 89 mg percent.  Hemoglobin A1c was quantity insufficient.  Vasculitic labs were all negative.  Hypercoagulable panel labs were negative except for factor V Leiden heterozygote state and slightly less protein acid level but total protein S activity was normal.  Patient had been taking birth control pills for heavy menstruation and has been smoking a few cigarettes a week.  She was advised to stop this.  She was discharged on aspirin and Plavix for 3 weeks followed by aspirin alone.  She started on Lipitor 10 mg as well which is tolerating well without side effects.  Patient has seen Dr. Leonides Schanz as an outpatient for hematology consultation who felt she did not need long-term anticoagulation and was okay on antiplatelet therapy.  Patient had low vitamin B12 of 88 for which she is started on B12 supplementation.  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  Patient has no history of DVT, pulmonary embolism, miscarriages.  She does not have history  of migraine headaches.  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  She is working currently and [employer]..  She denies doing drugs , marijuana and alcohol and has now quit smoking ROS:   14 system review of systems is positive for speech and word finding difficulties,  mixing up words, memory loss  PMH:  Past Medical History:  Diagnosis Date   Anxiety    Asthma    Stroke [facility])     Social History:  Social History   Socioeconomic History   Marital status: Single    Spouse name: Not on file   Number of children: Not on file   Years of education: Not on file   Highest education level: Not on file  Occupational History   Not on file  Tobacco Use   Smoking status: Some Days    Pack years: 0.00   Smokeless tobacco: Never  Vaping Use   Vaping Use: Some days  Substance and Sexual Activity   Alcohol use: Never   Drug use: Yes    Types: Marijuana   Sexual activity: Not on file  Other Topics Concern   Not on file  Social History Narrative   Lives with parents and siblings   Right Handed   Drinks very little caffeine   Social Determinants of Health   Financial Resource Strain: Not on file  Food Insecurity: Not on file  Transportation Needs: Not on file  Physical Activity: Not on file  Stress: Not on file  Social Connections: Not on file  Intimate Partner Violence: Not on file    Medications:   Current Outpatient Medications on File Prior to Visit  Medication Sig Dispense Refill   acetaminophen (TYLENOL) 325 MG tablet 1 tablet as needed for mild pain or headache     albuterol (VENTOLIN HFA) 108 (90 Base) MCG/ACT inhaler 1 puff     aspirin EC 81 MG EC tablet Take 1 tablet (81 mg total) by mouth daily. Swallow whole. 30 tablet 11   atorvastatin (LIPITOR) 10 MG tablet Take 2 tablets (20 mg total) by mouth at bedtime. 90 tablet 2   cyanocobalamin 1000 MCG tablet Take 1,000 mcg by mouth daily.     loratadine (CLARITIN) 5 MG chewable tablet 2 tablets     No current facility-administered medications on file prior to visit.    Allergies:  No Known Allergies  Physical Exam General: Pleasant frail young Caucasian girl, seated, in no evident distress Head: head normocephalic and atraumatic.   Neck: supple with no carotid or  supraclavicular bruits Cardiovascular: regular rate and rhythm, no murmurs Musculoskeletal: no deformity Skin:  no rash/petichiae Vascular:  Normal pulses all extremities  Neurologic Exam Mental Status: Awake and fully alert. Oriented to place and time. Recent and remote memory intact. Attention span, concentration and fund of knowledge appropriate. Mood and affect appropriate.  Recall 3/3.  Able to name 14 animals which can walk on 4 legs.  Clock drawing 4/4. Cranial Nerves: Fundoscopic exam reveals sharp disc margins. Pupils equal, briskly reactive to light. Extraocular movements full without nystagmus. Visual fields full to confrontation. Hearing intact. Facial sensation intact. Face, tongue, palate moves normally and symmetrically.  Motor: Normal bulk and tone. Normal strength in all tested extremity muscles. Sensory.: intact to touch , pinprick , position and vibratory sensation.  Coordination: Rapid alternating movements normal in all extremities. Finger-to-nose and heel-to-shin performed accurately bilaterally. Gait and Station: Arises from chair without  difficulty. Stance is normal. Gait demonstrates normal stride length and balance . Able to heel, toe and tandem walk without difficulty.  Reflexes: 1+ and symmetric. Toes downgoing.   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  vascular risk factors borderline hyperlipidemia, smoking and marijuana use and.  Heterozygote state for factor V Leiden without evidence of DVT.  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  I recommend she continue ongoing speech therapy for her aphasia which appears to be  improving.  Continue aspirin for stroke prevention and maintain aggressive risk factor modification with strict control of hyperlipidemia with LDL goal below 70 mg percent, hypertension with goal below 130/90 and diabetes with hemoglobin A1c goal below 6.5%.  I also advised her to not take any estrogen containing birth control pills as well as quit smoking completely and using marijuana..  I recommend we check transcranial Doppler bubble study to look for PFO which may have been missed as TEE was suboptimal without acute Valsalva and she was sedated.  She had transient low protein S which was likely an acute phase reactant.  She has MHTR mutation for hyperhomocystinemia but in the presence of normal homocystine levels this is of unclear significance.  Continue vitamin B 12 supplementation but change to Cerfolin nac due to her MHTR mutation .She also has factor V Leiden but no definite history of DVT hence agree with continuing antiplatelet therapy for now.  Check repeat MRI scan of the brain and MRA of the brain for follow-up.  She will return for follow-up in 3 months or call earlier if necessary.  Greater than 50% time during this 50-minute consultation visit was spent on counseling and coordination of care about her cryptogenic stroke and discussion about her factor V Leiden mutation heterozygous state and answering questions Delia Heady, MD  Note: This document was prepared with digital dictation and possible smart phrase technology. Any transcriptional errors that result from this process are unintentional.

## 2021-05-25 ENCOUNTER — Other Ambulatory Visit: Payer: Self-pay

## 2021-05-25 ENCOUNTER — Ambulatory Visit: Payer: BC Managed Care – PPO

## 2021-05-25 DIAGNOSIS — R4701 Aphasia: Secondary | ICD-10-CM | POA: Diagnosis not present

## 2021-05-25 DIAGNOSIS — Z0289 Encounter for other administrative examinations: Secondary | ICD-10-CM

## 2021-05-25 DIAGNOSIS — R41841 Cognitive communication deficit: Secondary | ICD-10-CM

## 2021-05-25 NOTE — Therapy (Signed)
[facility] [address] [place], [place], [address] Phone: [phone]   Fax:  [phone]  Speech Language Pathology Treatment  Patient Details  Name: Meghan Williamson MRN: [id number] Date of Birth: 2001-10-31 Referring Provider (SLP): Flora Lipps, MD   Encounter Date: 05/25/2021   End of Session - 05/25/21 1736     Visit Number 10    Number of Visits 17    Date for SLP Re-Evaluation 06/15/21    SLP Start Time 38    SLP Stop Time  1100    SLP Time Calculation (min) 41 min    Activity Tolerance Patient tolerated treatment well             Past Medical History:  Diagnosis Date   Anxiety    Asthma    Stroke [facility])     Past Surgical History:  Procedure Laterality Date   BUBBLE STUDY  04/14/2021   Procedure: BUBBLE STUDY;  Surgeon: Freada Bergeron, MD;  Location: [facility];  Service: Cardiovascular;;   IR ANGIO EXTERNAL CAROTID SEL EXT CAROTID BILAT MOD SED  04/13/2021       IR ANGIO EXTERNAL CAROTID SEL EXT CAROTID BILAT MOD SED  04/13/2021   IR ANGIO INTRA EXTRACRAN SEL INTERNAL CAROTID BILAT MOD SED  04/13/2021   IR ANGIO VERTEBRAL SEL SUBCLAVIAN INNOMINATE BILAT MOD SED  04/13/2021   IR US GUIDE VASC ACCESS RIGHT  04/13/2021   TEE WITHOUT CARDIOVERSION N/A 04/14/2021   Procedure: TRANSESOPHAGEAL ECHOCARDIOGRAM (TEE);  Surgeon: Freada Bergeron, MD;  Location: [facility] ENDOSCOPY;  Service: Cardiovascular;  Laterality: N/A;    There were no vitals filed for this visit.   Subjective Assessment - 05/25/21 1730     Subjective I'm so tired today - I think I did to omuch yesterday."    Currently in Pain? No/denies                   ADULT SLP TREATMENT - 05/25/21 1731       General Information   Behavior/Cognition Alert;Cooperative;Pleasant mood      Treatment Provided   Treatment provided Cognitive-Linquistic      Cognitive-Linquistic Treatment   Treatment focused on Aphasia;Cognition     Skilled Treatment Pt did not do homework. SLP stressed the importance of this - to think of this as "practice" for school. Pt to listen to a ~15 mintue ted talk or podcast and write synopsis of it and email it to SLP, one each day (7-12 and 05-26-21). She listened to a youTube today of 12 minutes with compensations of rewinding video x3, and pt taking notes. SLP asked pt "wh" questions about the video and pt with 80% success- but she provided a synopsis of the video with 50% non-specific/vague points.      Assessment / Recommendations / Plan   Plan Continue with current plan of care      Progression Toward Goals   Progression toward goals Progressing toward goals                SLP Short Term Goals - 05/18/21 1723       SLP SHORT TERM GOAL #1   Title pt will verbalize daily schedule, recent events and salient information using external memory aids  with rare min A to section in binder/app in phone, in 3 sessions    Baseline 05-14-21    Status Partially Met    Target Date 05/16/21      SLP SHORT  TERM GOAL #2   Title pt will read 3-4 sentences technical/academic information and demonstrate understanding by answering "wh"/yes/no questions with 90% success using compensations, in 3 sessions    Status Unable to assess    Target Date 05/16/21      SLP SHORT TERM GOAL #3   Title pt will participate functionally in 10 minutes simple conversation using anomia compensations in 3 sessions    Baseline 05-14-21    Status Partially Met    Target Date 05/16/21              SLP Long Term Goals - 05/25/21 1738       SLP LONG TERM GOAL #1   Title pt will participate functionally in 15 minutes simple-mod complex conversation using anomia compensations in 3 sessions    Baseline 05-14-21, 05-18-21, 05-20-21    Status Achieved      SLP LONG TERM GOAL #2   Title pt will verbalize daily schedule, recent events and salient information using external memory aids  with rare min A to section in  binder/app in phone, in 3 sessions    Baseline 05-18-21 (told SLP next session, and last-scheduled session for ST), 05-20-21, 05-25-21 (next session for ST)    Status Achieved      SLP LONG TERM GOAL #3   Title pt will read 3 paragraphs of technical/academic information and demonstrate understanding by answering "wh"/yes/no questions with 90% success using compensations, in 3 sessions    Time 4    Period Weeks    Status On-going      SLP LONG TERM GOAL #4   Title pt will use compensations for comprehension of long sections of auditory information to demonstrate understanding, in 3 sessions    Time 4    Period Weeks    Status On-going              Plan - 05/25/21 1737     Clinical Impression Statement Pt presents with improved anomic expressive aphasia, and mild memory deficits likely based in attention. She used compensations today of note taking and rewinding "lecture" however pt cont with vague synopsis points and 80% success with "wh" questions. Pt would benefit from cont'd skilled ST targeting language and compensations to alleviate effects of her decr'd cognition to improve communication effectiveness and optimize return to PLOF.    Speech Therapy Frequency 2x / week    Duration 8 weeks    Treatment/Interventions Environmental controls;Compensatory techniques;Functional tasks;Multimodal communcation approach;SLP instruction and feedback;Cueing hierarchy;Language facilitation;Cognitive reorganization;Patient/family education;Internal/external aids    Potential to Achieve Goals Good    SLP Home Exercise Plan provided    Consulted and Agree with Plan of Care Patient             Patient will benefit from skilled therapeutic intervention in order to improve the following deficits and impairments:   Cognitive communication deficit  Aphasia    Problem List Patient Active Problem List   Diagnosis Date Noted   Acute CVA (cerebrovascular accident) ([place]) 04/12/2021   Dysarthria  04/11/2021   Memory difficulties 04/11/2021    [place]. ,[place], [place]  05/25/2021, 5:39 PM  [facility] [address] [place], [place], [address] Phone: [phone]   Fax:  [phone]   Name: Meghan Williamson MRN: [id number] Date of Birth: June 16, 2001

## 2021-05-26 ENCOUNTER — Telehealth: Payer: Self-pay | Admitting: *Deleted

## 2021-05-26 ENCOUNTER — Ambulatory Visit (INDEPENDENT_AMBULATORY_CARE_PROVIDER_SITE_OTHER): Payer: BC Managed Care – PPO

## 2021-05-26 DIAGNOSIS — I639 Cerebral infarction, unspecified: Secondary | ICD-10-CM | POA: Diagnosis not present

## 2021-05-26 MED ORDER — GADOBENATE DIMEGLUMINE 529 MG/ML IV SOLN
15.0000 mL | Freq: Once | INTRAVENOUS | Status: AC | PRN
  Administered 2021-05-26: 15 mL via INTRAVENOUS

## 2021-05-26 NOTE — Telephone Encounter (Signed)
I spoke to the patient's mother. The ppw is related to an additional insurance policy the mother has in her name to help cover out-of pocket cost from the CVA. The patient is a full-time Nurse, learning disability. She has resumed her activities. The ppw has been completed and returned to [place] in medical records.

## 2021-05-26 NOTE — Telephone Encounter (Signed)
Our office received disability paperwork today. I need clarification of needs. I left a message for the patient's mother on DPR.

## 2021-05-26 NOTE — Telephone Encounter (Signed)
I faxed disability claim form on 05/26/21

## 2021-05-27 ENCOUNTER — Other Ambulatory Visit: Payer: Self-pay

## 2021-05-27 ENCOUNTER — Ambulatory Visit: Payer: BC Managed Care – PPO

## 2021-05-27 DIAGNOSIS — R4701 Aphasia: Secondary | ICD-10-CM

## 2021-05-27 DIAGNOSIS — R41841 Cognitive communication deficit: Secondary | ICD-10-CM

## 2021-05-27 NOTE — Therapy (Signed)
[facility] [address] [place], [place], [address] Phone: [phone]   Fax:  [phone]  Speech Language Pathology Treatment  Patient Details  Name: Meghan Williamson MRN: [id number] Date of Birth: 01-Feb-2001 Referring Provider (SLP): Flora Lipps, MD   Encounter Date: 05/27/2021   End of Session - 05/27/21 1610     Visit Number 11    Number of Visits 17    Date for SLP Re-Evaluation 06/15/21    SLP Start Time 4    SLP Stop Time  1100    SLP Time Calculation (min) 40 min    Activity Tolerance Patient tolerated treatment well             Past Medical History:  Diagnosis Date   Anxiety    Asthma    Stroke [facility])     Past Surgical History:  Procedure Laterality Date   BUBBLE STUDY  04/14/2021   Procedure: BUBBLE STUDY;  Surgeon: Freada Bergeron, MD;  Location: [facility];  Service: Cardiovascular;;   IR ANGIO EXTERNAL CAROTID SEL EXT CAROTID BILAT MOD SED  04/13/2021       IR ANGIO EXTERNAL CAROTID SEL EXT CAROTID BILAT MOD SED  04/13/2021   IR ANGIO INTRA EXTRACRAN SEL INTERNAL CAROTID BILAT MOD SED  04/13/2021   IR ANGIO VERTEBRAL SEL SUBCLAVIAN INNOMINATE BILAT MOD SED  04/13/2021   IR US GUIDE VASC ACCESS RIGHT  04/13/2021   TEE WITHOUT CARDIOVERSION N/A 04/14/2021   Procedure: TRANSESOPHAGEAL ECHOCARDIOGRAM (TEE);  Surgeon: Freada Bergeron, MD;  Location: [facility] ENDOSCOPY;  Service: Cardiovascular;  Laterality: N/A;    There were no vitals filed for this visit.   Subjective Assessment - 05/27/21 1045     Subjective Pt bought organizer at [employer] that she said will help her prioritize tasks for upcoming year.    Currently in Pain? No/denies                   ADULT SLP TREATMENT - 05/27/21 1047       General Information   Behavior/Cognition Alert;Cooperative;Pleasant mood      Treatment Provided   Treatment provided Cognitive-Linquistic      Cognitive-Linquistic Treatment    Treatment focused on Aphasia;Cognition    Skilled Treatment Pt did both assignments but only emailed one to SLP - "I did the other one so late I just thought I'd show it ot you." SLP encouraged pt to still email as this is "practice for the fall semester." Meghan Williamson stated she rewound and paused as needed for her to take notes and to comprehend the information. Pt demonstrated anticipatory awareness (see "s"). SLP ensured pt knew she needed to reach out to student assistance in first two weeks of [place] for assessment regarding modifications and accomodations.      Assessment / Recommendations / Plan   Plan Continue with current plan of care      Progression Toward Goals   Progression toward goals Progressing toward goals              SLP Education - 05/27/21 1610     Education Details need to contact student assistance services if notices difficultyin first two weeks    Person(s) Educated Patient    Methods Explanation    Comprehension Verbalized understanding              SLP Short Term Goals - 05/18/21 1723       SLP SHORT TERM GOAL #1   Title  pt will verbalize daily schedule, recent events and salient information using external memory aids  with rare min A to section in binder/app in phone, in 3 sessions    Baseline 05-14-21    Status Partially Met    Target Date 05/16/21      SLP SHORT TERM GOAL #2   Title pt will read 3-4 sentences technical/academic information and demonstrate understanding by answering "wh"/yes/no questions with 90% success using compensations, in 3 sessions    Status Unable to assess    Target Date 05/16/21      SLP SHORT TERM GOAL #3   Title pt will participate functionally in 10 minutes simple conversation using anomia compensations in 3 sessions    Baseline 05-14-21    Status Partially Met    Target Date 05/16/21              SLP Long Term Goals - 05/27/21 1041       SLP LONG TERM GOAL #1   Title pt will participate functionally in 15  minutes simple-mod complex conversation using anomia compensations in 3 sessions    Baseline 05-14-21, 05-18-21, 05-20-21    Status Achieved      SLP LONG TERM GOAL #2   Title pt will verbalize daily schedule, recent events and salient information using external memory aids  with rare min A to section in binder/app in phone, in 3 sessions    Baseline 05-18-21 (told SLP next session, and last-scheduled session for ST), 05-20-21, 05-25-21 (next session for ST)    Status Achieved      SLP LONG TERM GOAL #3   Title pt will read 3 paragraphs of technical/academic information and demonstrate understanding by answering "wh"/yes/no questions with 90% success using compensations, in 3 sessions    Time 4    Period Weeks    Status On-going    Target Date 06/15/21      SLP LONG TERM GOAL #4   Title pt will use compensations for comprehension of long sections of auditory information to demonstrate understanding, in 3 sessions    Time 4    Period Weeks    Status On-going    Target Date 06/15/21              Plan - 05/27/21 1611     Clinical Impression Statement Pt presents with improved anomic expressive aphasia, and mild memory deficits likely based in attention. She used compensations today of note taking and rewinding "lecture" however pt cont with vague synopsis points and 80% success with "wh" questions. Pt would benefit from cont'd skilled ST targeting language and compensations to alleviate effects of her decr'd cognition to improve communication effectiveness and optimize return to PLOF.    Speech Therapy Frequency 2x / week    Duration 8 weeks    Treatment/Interventions Environmental controls;Compensatory techniques;Functional tasks;Multimodal communcation approach;SLP instruction and feedback;Cueing hierarchy;Language facilitation;Cognitive reorganization;Patient/family education;Internal/external aids    Potential to Achieve Goals Good    SLP Home Exercise Plan provided    Consulted and Agree  with Plan of Care Patient             Patient will benefit from skilled therapeutic intervention in order to improve the following deficits and impairments:   Aphasia  Cognitive communication deficit    Problem List Patient Active Problem List   Diagnosis Date Noted   Acute CVA (cerebrovascular accident) ([facility]) 04/12/2021   Dysarthria 04/11/2021   Memory difficulties 04/11/2021    [place]. ,[place], [place]  05/27/2021,  [facility] [address] [place], [place], [address] Phone: [phone]   Fax:  [phone]   Name: Meghan Williamson MRN: [id number] Date of Birth: 2001/05/15

## 2021-06-01 ENCOUNTER — Ambulatory Visit: Payer: BC Managed Care – PPO

## 2021-06-01 ENCOUNTER — Other Ambulatory Visit: Payer: Self-pay

## 2021-06-01 DIAGNOSIS — R4701 Aphasia: Secondary | ICD-10-CM | POA: Diagnosis not present

## 2021-06-01 DIAGNOSIS — R41841 Cognitive communication deficit: Secondary | ICD-10-CM

## 2021-06-02 ENCOUNTER — Ambulatory Visit: Payer: BC Managed Care – PPO

## 2021-06-02 DIAGNOSIS — R4701 Aphasia: Secondary | ICD-10-CM | POA: Diagnosis not present

## 2021-06-02 DIAGNOSIS — R41841 Cognitive communication deficit: Secondary | ICD-10-CM

## 2021-06-02 NOTE — Therapy (Signed)
[facility] [address] [place], [place], [address] Phone: [phone]   Fax:  [phone]  Speech Language Pathology Treatment  Patient Details  Name: Meghan Williamson MRN: [id number] Date of Birth: [birth year] Referring Provider (SLP): Flora Lipps, MD   Encounter Date: 06/01/2021   End of Session - 06/02/21 0834     Visit Number 12    Number of Visits 17    Date for SLP Re-Evaluation 06/15/21    SLP Start Time 1021    SLP Stop Time  1101    SLP Time Calculation (min) 40 min    Activity Tolerance Patient tolerated treatment well             Past Medical History:  Diagnosis Date   Anxiety    Asthma    Stroke [facility])     Past Surgical History:  Procedure Laterality Date   BUBBLE STUDY  04/14/2021   Procedure: BUBBLE STUDY;  Surgeon: Freada Bergeron, MD;  Location: [facility] ENDOSCOPY;  Service: Cardiovascular;;   IR ANGIO EXTERNAL CAROTID SEL EXT CAROTID BILAT MOD SED  04/13/2021       IR ANGIO EXTERNAL CAROTID SEL EXT CAROTID BILAT MOD SED  04/13/2021   IR ANGIO INTRA EXTRACRAN SEL INTERNAL CAROTID BILAT MOD SED  04/13/2021   IR ANGIO VERTEBRAL SEL SUBCLAVIAN INNOMINATE BILAT MOD SED  04/13/2021   IR US GUIDE VASC ACCESS RIGHT  04/13/2021   TEE WITHOUT CARDIOVERSION N/A 04/14/2021   Procedure: TRANSESOPHAGEAL ECHOCARDIOGRAM (TEE);  Surgeon: Freada Bergeron, MD;  Location: [facility] ENDOSCOPY;  Service: Cardiovascular;  Laterality: N/A;    There were no vitals filed for this visit.   Subjective Assessment - 06/02/21 0802     Subjective Pt brings in her homework to read a story and give synopsis.    Currently in Pain? No/denies                   ADULT SLP TREATMENT - 06/02/21 0802       General Information   Behavior/Cognition Alert;Cooperative;Pleasant mood      Treatment Provided   Treatment provided Cognitive-Linquistic      Cognitive-Linquistic Treatment   Treatment focused on  Aphasia;Cognition    Skilled Treatment Pt provided a vague at times verbal synopsis for the short story she read for homework today. Pt highlighted, took notes, and answered the questions provided in the text. "I read it last night so that it would be more fresh for me to tell you about today." SLP req'd to cue pt for more details for a more thorough summary of her story. Pt to return to [place] 06-20-21. She demonstrated knowledge and awareness of incr'd time, level of work, and carefulness she will need to put forth for this semester, given her CVA. She indicated to SLP that she will contact student assistance services if she experiences challenges she deems are too much for her, in the first two weeks of classes.      Assessment / Recommendations / Plan   Plan Continue with current plan of care   suspect d/c in next 2-3 visits     Progression Toward Goals   Progression toward goals Progressing toward goals                SLP Short Term Goals - 05/18/21 1723       SLP SHORT TERM GOAL #1   Title pt will verbalize daily schedule, recent events and salient information using  external memory aids  with rare min A to section in binder/app in phone, in 3 sessions    Baseline 05-14-21    Status Partially Met    Target Date 05/16/21      SLP SHORT TERM GOAL #2   Title pt will read 3-4 sentences technical/academic information and demonstrate understanding by answering "wh"/yes/no questions with 90% success using compensations, in 3 sessions    Status Unable to assess    Target Date 05/16/21      SLP SHORT TERM GOAL #3   Title pt will participate functionally in 10 minutes simple conversation using anomia compensations in 3 sessions    Baseline 05-14-21    Status Partially Met    Target Date 05/16/21              SLP Long Term Goals - 06/02/21 0838       SLP LONG TERM GOAL #1   Title pt will participate functionally in 15 minutes simple-mod complex conversation using anomia  compensations in 3 sessions    Baseline 05-14-21, 05-18-21, 05-20-21    Status Achieved      SLP LONG TERM GOAL #2   Title pt will verbalize daily schedule, recent events and salient information using external memory aids  with rare min A to section in binder/app in phone, in 3 sessions    Baseline 05-18-21 (told SLP next session, and last-scheduled session for ST), 05-20-21, 05-25-21 (next session for ST)    Status Achieved      SLP LONG TERM GOAL #3   Title pt will read 3 paragraphs of technical/academic information and demonstrate understanding by answering "wh"/yes/no questions with 90% success using compensations, in 3 sessions    Time 4    Period Weeks    Status On-going      SLP LONG TERM GOAL #4   Title pt will use compensations for comprehension of long sections of auditory information to demonstrate understanding, in 3 sessions    Baseline 05-27-21, 06-01-21    Time 3    Period Weeks    Status On-going    Target Date 06/15/21              Plan - 06/02/21 0835     Clinical Impression Statement Pt presents with improved anomic expressive aphasia, and mild memory deficits likely based in attention. See "skilled intervention" for details of today's session. Pt would benefit from cont'd skilled ST targeting language and compensations to alleviate effects of her decr'd cognition to improve communication effectiveness and optimize return to PLOF.    Speech Therapy Frequency 2x / week    Duration 8 weeks    Treatment/Interventions Environmental controls;Compensatory techniques;Functional tasks;Multimodal communcation approach;SLP instruction and feedback;Cueing hierarchy;Language facilitation;Cognitive reorganization;Patient/family education;Internal/external aids    Potential to Achieve Goals Good    SLP Home Exercise Plan provided    Consulted and Agree with Plan of Care Patient             Patient will benefit from skilled therapeutic intervention in order to improve the  following deficits and impairments:   Aphasia  Cognitive communication deficit    Problem List Patient Active Problem List   Diagnosis Date Noted   Acute CVA (cerebrovascular accident) ([place]) 04/12/2021   Dysarthria 04/11/2021   Memory difficulties 04/11/2021    [place] ,[place], [place]  06/02/2021, 8:39 AM  [facility] [address] [place], [place], [address] Phone: [phone]   Fax:  [phone]  Name: Meghan Williamson MRN: [id number] Date of Birth: 01-02-2001

## 2021-06-03 NOTE — Therapy (Signed)
[facility] [address] [place], [place], [address] Phone: [phone]   Fax:  [phone]  Speech Language Pathology Treatment  Patient Details  Name: Meghan Williamson MRN: [id number] Date of Birth: [birth year] Referring Provider (SLP): Flora Lipps, MD   Encounter Date: 06/02/2021   End of Session - 06/03/21 1019     Visit Number 13    Number of Visits 17    Date for SLP Re-Evaluation 06/15/21    SLP Start Time 80    SLP Stop Time  1400    SLP Time Calculation (min) 41 min    Activity Tolerance Patient tolerated treatment well             Past Medical History:  Diagnosis Date   Anxiety    Asthma    Stroke [facility])     Past Surgical History:  Procedure Laterality Date   BUBBLE STUDY  04/14/2021   Procedure: BUBBLE STUDY;  Surgeon: Freada Bergeron, MD;  Location: [facility] ENDOSCOPY;  Service: Cardiovascular;;   IR ANGIO EXTERNAL CAROTID SEL EXT CAROTID BILAT MOD SED  04/13/2021       IR ANGIO EXTERNAL CAROTID SEL EXT CAROTID BILAT MOD SED  04/13/2021   IR ANGIO INTRA EXTRACRAN SEL INTERNAL CAROTID BILAT MOD SED  04/13/2021   IR ANGIO VERTEBRAL SEL SUBCLAVIAN INNOMINATE BILAT MOD SED  04/13/2021   IR US GUIDE VASC ACCESS RIGHT  04/13/2021   TEE WITHOUT CARDIOVERSION N/A 04/14/2021   Procedure: TRANSESOPHAGEAL ECHOCARDIOGRAM (TEE);  Surgeon: Freada Bergeron, MD;  Location: [facility] ENDOSCOPY;  Service: Cardiovascular;  Laterality: N/A;    There were no vitals filed for this visit.   Subjective Assessment - 06/03/21 1015     Subjective "We changed my appointment from Thursday cuz I have to go to [facility]."    Currently in Pain? No/denies                   ADULT SLP TREATMENT - 06/03/21 0001       General Information   Behavior/Cognition Alert;Cooperative;Pleasant mood      Treatment Provided   Treatment provided Cognitive-Linquistic      Cognitive-Linquistic Treatment   Treatment focused on  Cognition;Aphasia    Skilled Treatment Pt endorsed she is not as engaged with conversation due to dififculty with attention to topic - SLP worked with pt with radio on and her searching for things on her phone told to her by SLP. Pt told SLP she has some difficulty with auditory distraction moreso now than prior to CVA. SLP took opportunity to share compensations with her of environmental manipulations such as limiting distractions during conversation (auditory and visual), etc. Pt voiced understanding, and she reiterated to SLP that she will need to be more mindful of this especially in lectures in upcoming semester.      Assessment / Recommendations / Plan   Plan Continue with current plan of care      Progression Toward Goals   Progression toward goals Progressing toward goals                SLP Short Term Goals - 05/18/21 1723       SLP SHORT TERM GOAL #1   Title pt will verbalize daily schedule, recent events and salient information using external memory aids  with rare min A to section in binder/app in phone, in 3 sessions    Baseline 05-14-21    Status Partially Met    Target Date  05/16/21      SLP SHORT TERM GOAL #2   Title pt will read 3-4 sentences technical/academic information and demonstrate understanding by answering "wh"/yes/no questions with 90% success using compensations, in 3 sessions    Status Unable to assess    Target Date 05/16/21      SLP SHORT TERM GOAL #3   Title pt will participate functionally in 10 minutes simple conversation using anomia compensations in 3 sessions    Baseline 05-14-21    Status Partially Met    Target Date 05/16/21              SLP Long Term Goals - 06/03/21 1020       SLP LONG TERM GOAL #1   Title pt will participate functionally in 15 minutes simple-mod complex conversation using anomia compensations in 3 sessions    Baseline 05-14-21, 05-18-21, 05-20-21    Status Achieved      SLP LONG TERM GOAL #2   Title pt will verbalize  daily schedule, recent events and salient information using external memory aids  with rare min A to section in binder/app in phone, in 3 sessions    Baseline 05-18-21 (told SLP next session, and last-scheduled session for ST), 05-20-21, 05-25-21 (next session for ST)    Status Achieved      SLP LONG TERM GOAL #3   Title pt will read 3 paragraphs of technical/academic information and demonstrate understanding by answering "wh"/yes/no questions with 90% success using compensations, in 3 sessions    Time 4    Period Weeks    Status On-going    Target Date 06/15/21      SLP LONG TERM GOAL #4   Title pt will use compensations for comprehension of long sections of auditory information to demonstrate understanding, in 3 sessions    Baseline 05-27-21, 06-01-21    Time 3    Period Weeks    Status On-going    Target Date 06/15/21              Plan - 06/03/21 1019     Clinical Impression Statement Pt presents with improved anomic expressive aphasia, and mild memory deficits likely based in attention. See "skilled intervention" for details of today's session. Pt would benefit from cont'd skilled ST targeting language and compensations to alleviate effects of her decr'd cognition to improve communication effectiveness and optimize return to PLOF.    Speech Therapy Frequency 2x / week    Duration 8 weeks    Treatment/Interventions Environmental controls;Compensatory techniques;Functional tasks;Multimodal communcation approach;SLP instruction and feedback;Cueing hierarchy;Language facilitation;Cognitive reorganization;Patient/family education;Internal/external aids    Potential to Achieve Goals Good    SLP Home Exercise Plan provided    Consulted and Agree with Plan of Care Patient             Patient will benefit from skilled therapeutic intervention in order to improve the following deficits and impairments:   Aphasia  Cognitive communication deficit    Problem List Patient Active  Problem List   Diagnosis Date Noted   Acute CVA (cerebrovascular accident) ([place]) 04/12/2021   Dysarthria 04/11/2021   Memory difficulties 04/11/2021    [place] ,[place], [place]  06/03/2021, 10:21 AM  [facility] [address] [employer], [place], [address] Phone: [phone]   Fax:  [phone]   Name: Deniqua Farha MRN: [id number] Date of Birth: 2001/05/01

## 2021-06-08 ENCOUNTER — Ambulatory Visit: Payer: BC Managed Care – PPO

## 2021-06-08 ENCOUNTER — Other Ambulatory Visit: Payer: Self-pay

## 2021-06-08 DIAGNOSIS — R41841 Cognitive communication deficit: Secondary | ICD-10-CM

## 2021-06-08 DIAGNOSIS — R4701 Aphasia: Secondary | ICD-10-CM | POA: Diagnosis not present

## 2021-06-08 NOTE — Therapy (Signed)
[facility] [address] [place], [place], [address] Phone: [phone]   Fax:  [phone]  Speech Language Pathology Treatment  Patient Details  Name: Meghan Williamson MRN: [id number] Date of Birth: 2000/12/28 Referring Provider (SLP): Flora Lipps, MD   Encounter Date: 06/08/2021   End of Session - 06/08/21 1207     Visit Number 14    Number of Visits 17    Date for SLP Re-Evaluation 06/15/21    SLP Start Time 50    SLP Stop Time  1100    SLP Time Calculation (min) 40 min    Activity Tolerance Patient tolerated treatment well             Past Medical History:  Diagnosis Date   Anxiety    Asthma    Stroke [facility])     Past Surgical History:  Procedure Laterality Date   BUBBLE STUDY  04/14/2021   Procedure: BUBBLE STUDY;  Surgeon: Freada Bergeron, MD;  Location: [facility] ENDOSCOPY;  Service: Cardiovascular;;   IR ANGIO EXTERNAL CAROTID SEL EXT CAROTID BILAT MOD SED  04/13/2021       IR ANGIO EXTERNAL CAROTID SEL EXT CAROTID BILAT MOD SED  04/13/2021   IR ANGIO INTRA EXTRACRAN SEL INTERNAL CAROTID BILAT MOD SED  04/13/2021   IR ANGIO VERTEBRAL SEL SUBCLAVIAN INNOMINATE BILAT MOD SED  04/13/2021   IR US GUIDE VASC ACCESS RIGHT  04/13/2021   TEE WITHOUT CARDIOVERSION N/A 04/14/2021   Procedure: TRANSESOPHAGEAL ECHOCARDIOGRAM (TEE);  Surgeon: Freada Bergeron, MD;  Location: [facility] ENDOSCOPY;  Service: Cardiovascular;  Laterality: N/A;    There were no vitals filed for this visit.   Subjective Assessment - 06/08/21 1200     Subjective "([facility] neurologist) pretty much told me the same thing."    Currently in Pain? No/denies                   ADULT SLP TREATMENT - 06/08/21 1028       General Information   Behavior/Cognition Alert;Cooperative;Pleasant mood      Cognitive-Linquistic Treatment   Treatment focused on Cognition;Aphasia    Skilled Treatment Pt relays to SLP that in car yesterday she had  difficulty with verbal expression when telling her friend a story. SLP and pt talked about practical ways to incr auditory and reading comprehension as well as efficiency of verbal expression by limiting distractions. Pt generated ideas of front row or "sitting in front of the professor" and going to her room in solitude, sitting on the "quiet floor" in [employer]. Pt will be recording lectures and listening back to them in case her auditory comprehension wanes during lectures. Lastly SLP assisted pt in knowing she could use study carrels and repeated work-break cycles and she would be more productive with reading comprehension and auditory comprehension than just working straight without breaks. Tita reproted understanding these concept to SLP an repeated back some of them to SLP.      Assessment / Recommendations / Plan   Plan Continue with current plan of care   next session last one prior to pt return to school     Progression Toward Goals   Progression toward goals Progressing toward goals              SLP Education - 06/08/21 1207     Education Details see daily note              SLP Short Term Goals - 05/18/21  [place] #1   Title pt will verbalize daily schedule, recent events and salient information using external memory aids  with rare min A to section in binder/app in phone, in 3 sessions    Baseline 05-14-21    Status Partially Met    Target Date 05/16/21      SLP SHORT TERM GOAL #2   Title pt will read 3-4 sentences technical/academic information and demonstrate understanding by answering "wh"/yes/no questions with 90% success using compensations, in 3 sessions    Status Unable to assess    Target Date 05/16/21      SLP SHORT TERM GOAL #3   Title pt will participate functionally in 10 minutes simple conversation using anomia compensations in 3 sessions    Baseline 05-14-21    Status Partially Met    Target Date 05/16/21              SLP  Long Term Goals - 06/08/21 1209       SLP LONG TERM GOAL #1   Title pt will participate functionally in 15 minutes simple-mod complex conversation using anomia compensations in 3 sessions    Baseline 05-14-21, 05-18-21, 05-20-21    Status Achieved      SLP LONG TERM GOAL #2   Title pt will verbalize daily schedule, recent events and salient information using external memory aids  with rare min A to section in binder/app in phone, in 3 sessions    Baseline 05-18-21 (told SLP next session, and last-scheduled session for ST), 05-20-21, 05-25-21 (next session for ST)    Status Achieved      SLP LONG TERM GOAL #3   Title pt will read 3 paragraphs of technical/academic information and demonstrate understanding by answering "wh"/yes/no questions with 90% success using compensations, in 3 sessions    Time 4    Period Weeks    Status On-going      SLP LONG TERM GOAL #4   Title pt will use compensations for comprehension of long sections of auditory information to demonstrate understanding, in 3 sessions    Baseline 05-27-21, 06-01-21    Time 3    Period Weeks    Status On-going              Plan - 06/08/21 1208     Clinical Impression Statement Pt presents with improved anomic expressive aphasia, and mild memory deficits likely based in attention. See "skilled intervention" for details of today's session. Pt would benefit from cont'd skilled ST targeting language and compensations to alleviate effects of her decr'd cognition to improve communication effectiveness and optimize return to PLOF. Last visit is next week - pt agrees to d/c at that time.    Speech Therapy Frequency 2x / week    Duration 8 weeks    Treatment/Interventions Environmental controls;Compensatory techniques;Functional tasks;Multimodal communcation approach;SLP instruction and feedback;Cueing hierarchy;Language facilitation;Cognitive reorganization;Patient/family education;Internal/external aids    Potential to Achieve Goals Good     SLP Home Exercise Plan provided    Consulted and Agree with Plan of Care Patient             Patient will benefit from skilled therapeutic intervention in order to improve the following deficits and impairments:   Aphasia  Cognitive communication deficit    Problem List Patient Active Problem List   Diagnosis Date Noted   Acute CVA (cerebrovascular accident) ([place]) 04/12/2021   Dysarthria 04/11/2021   Memory difficulties 04/11/2021  [place] ,[place], [place]  06/08/2021, 12:11 PM  [facility] [address] [place], [place], [address] Phone: [phone]   Fax:  [phone]   Name: Meghan Williamson MRN: [id number] Date of Birth: Dec 13, 2000

## 2021-06-09 ENCOUNTER — Ambulatory Visit (HOSPITAL_COMMUNITY)
Admission: RE | Admit: 2021-06-09 | Discharge: 2021-06-09 | Disposition: A | Discharge status: Home / Self Care | Payer: BC Managed Care – PPO | Source: Ambulatory Visit | Attending: Neurology | Admitting: Neurology

## 2021-06-09 DIAGNOSIS — I639 Cerebral infarction, unspecified: Secondary | ICD-10-CM

## 2021-06-14 ENCOUNTER — Telehealth: Payer: Self-pay

## 2021-06-14 NOTE — Telephone Encounter (Signed)
I called the pt's mom ( ok per dpr) and advised of results via vm ( ok per dpr).  I advised for her to call back if she had any questions/concerns.

## 2021-06-14 NOTE — Telephone Encounter (Signed)
-----   Message from Micki Riley, MD sent at 06/11/2021 11:56 AM EDT ----- Joneen Roach inform the patient that transcranial Doppler bubble study showed no evidence of a hole in the heart ----- Message ----- From: Interface, [employer] Sent: 06/09/2021   1:31 PM EDT To: Micki Riley, MD

## 2021-06-15 ENCOUNTER — Other Ambulatory Visit: Payer: Self-pay

## 2021-06-15 ENCOUNTER — Ambulatory Visit: Payer: BC Managed Care – PPO | Attending: Internal Medicine

## 2021-06-15 DIAGNOSIS — R41841 Cognitive communication deficit: Secondary | ICD-10-CM | POA: Diagnosis present

## 2021-06-15 DIAGNOSIS — R4701 Aphasia: Secondary | ICD-10-CM | POA: Diagnosis present

## 2021-06-15 NOTE — Therapy (Signed)
[facility] [address] [place], [place], [address] Phone: [phone]   Fax:  [phone]  Speech Language Pathology Treatment/discharge summary  Patient Details  Name: Meghan Williamson MRN: [id number] Date of Birth: 2000/11/17 Referring Provider (SLP): Flora Lipps, MD   Encounter Date: 06/15/2021   End of Session - 06/15/21 1659     Visit Number 15    Number of Visits 17    Date for SLP Re-Evaluation 06/15/21    SLP Start Time 1404    SLP Stop Time  1446    SLP Time Calculation (min) 42 min    Activity Tolerance Patient tolerated treatment well             Past Medical History:  Diagnosis Date   Anxiety    Asthma    Stroke [facility])     Past Surgical History:  Procedure Laterality Date   BUBBLE STUDY  04/14/2021   Procedure: BUBBLE STUDY;  Surgeon: Freada Bergeron, MD;  Location: [facility] ENDOSCOPY;  Service: Cardiovascular;;   IR ANGIO EXTERNAL CAROTID SEL EXT CAROTID BILAT MOD SED  04/13/2021       IR ANGIO EXTERNAL CAROTID SEL EXT CAROTID BILAT MOD SED  04/13/2021   IR ANGIO INTRA EXTRACRAN SEL INTERNAL CAROTID BILAT MOD SED  04/13/2021   IR ANGIO VERTEBRAL SEL SUBCLAVIAN INNOMINATE BILAT MOD SED  04/13/2021   IR US GUIDE VASC ACCESS RIGHT  04/13/2021   TEE WITHOUT CARDIOVERSION N/A 04/14/2021   Procedure: TRANSESOPHAGEAL ECHOCARDIOGRAM (TEE);  Surgeon: Freada Bergeron, MD;  Location: [facility] ENDOSCOPY;  Service: Cardiovascular;  Laterality: N/A;    There were no vitals filed for this visit.    SPEECH THERAPY DISCHARGE SUMMARY  Visits from Start of Care: 15  Current functional level related to goals / functional outcomes: See goals below.   Remaining deficits: Mild auditory comprehension difficulties. Attention difficulties.   Education / Equipment: Compensations for deficit areas.   Patient agrees to discharge. Patient goals were partially met. Patient is being discharged due to the patient's  request. (returning to school).         ADULT SLP TREATMENT - 06/15/21 1429       General Information   Behavior/Cognition Alert;Cooperative;Pleasant mood      Treatment Provided   Treatment provided Cognitive-Linquistic      Pain Assessment   Pain Assessment No/denies pain      Cognitive-Linquistic Treatment   Treatment focused on Cognition;Aphasia    Skilled Treatment Pt tells SLP anomia happens very rarely (once/week) at this time.SLP asked pt for her synopses of approx 45 minute auditory information and pt did so with good-excellent success; Meghan Williamson stated she paused the auditory info if she needed more time to think about comprehension and/or take notes, and rarely rewound. Pt told SLP whta she would need to do to compensate for improving comprehension of auditory info at school -SLP gave pt information about Constant Therapy as well. (self care/home management 12 mintues): SLP invited pt's mother in to [place] room and SLP and pt shared with pt's mother key points of previous 3-4 visits and provided mother a chance to ask questions adn verify to SLP how pt/family have discussed how to handle steps if pt requires further assistance. Pt may need modifidcation/accomodation for extra time for projects/presentations, and/or a note-taker in her lectures.      Assessment / Recommendations / Plan   Plan Discharge SLP treatment due to (comment)   pt request; suggest modification of  notes from lectures; extra time for projects     Progression Toward Goals   Progression toward goals Progressing toward goals              SLP Education - 06/15/21 1657     Education Details Constant Therapy, reiterating copmensations for auditorycomprehension, modifications/accomodations    Person(s) Educated Patient;Parent(s)    Methods Explanation    Comprehension Verbalized understanding              SLP Short Term Goals - 05/18/21 1723       SLP SHORT TERM GOAL #1   Title pt will verbalize  daily schedule, recent events and salient information using external memory aids  with rare min A to section in binder/app in phone, in 3 sessions    Baseline 05-14-21    Status Partially Met    Target Date 05/16/21      SLP SHORT TERM GOAL #2   Title pt will read 3-4 sentences technical/academic information and demonstrate understanding by answering "wh"/yes/no questions with 90% success using compensations, in 3 sessions    Status Unable to assess    Target Date 05/16/21      SLP SHORT TERM GOAL #3   Title pt will participate functionally in 10 minutes simple conversation using anomia compensations in 3 sessions    Baseline 05-14-21    Status Partially Met    Target Date 05/16/21              SLP Long Term Goals - 06/15/21 1701       SLP LONG TERM GOAL #1   Title pt will participate functionally in 15 minutes simple-mod complex conversation using anomia compensations in 3 sessions    Baseline 05-14-21, 05-18-21, 05-20-21    Status Achieved      SLP LONG TERM GOAL #2   Title pt will verbalize daily schedule, recent events and salient information using external memory aids  with rare min A to section in binder/app in phone, in 3 sessions    Baseline 05-18-21 (told SLP next session, and last-scheduled session for ST), 05-20-21, 05-25-21 (next session for ST)    Status Achieved      SLP LONG TERM GOAL #3   Title pt will read 3 paragraphs of technical/academic information and demonstrate understanding by answering "wh"/yes/no questions with 90% success using compensations, in 3 sessions    Status Deferred      SLP LONG TERM GOAL #4   Title pt will use compensations for comprehension of long sections of auditory information to demonstrate understanding, in 3 sessions    Baseline 05-27-21, 06-01-21, 06-15-21    Status Achieved              Plan - 06/15/21 1659     Clinical Impression Statement Pt presents with improved anomic expressive aphasia, and diffiulty with auditory comprehension.  See "skilled intervention" for details of today's session. Pt would benefit from cont'd skilled ST targeting language and compensations to alleviate effects of her decr'd cognition to improve communication effectiveness and optimize return to PLOF. Pt agrees with d/c at this time.    Speech Therapy Frequency 2x / week    Duration 8 weeks    Treatment/Interventions Environmental controls;Compensatory techniques;Functional tasks;Multimodal communcation approach;SLP instruction and feedback;Cueing hierarchy;Language facilitation;Cognitive reorganization;Patient/family education;Internal/external aids    Potential to Achieve Goals Good    SLP Home Exercise Plan provided    Consulted and Agree with Plan of Care Patient  Patient will benefit from skilled therapeutic intervention in order to improve the following deficits and impairments:   Aphasia  Cognitive communication deficit    Problem List Patient Active Problem List   Diagnosis Date Noted   Acute CVA (cerebrovascular accident) ([place]) 04/12/2021   Dysarthria 04/11/2021   Memory difficulties 04/11/2021    [facility] 06/15/2021, 5:02 PM  [facility] [address] [place], [place], [address] Phone: [phone]   Fax:  [phone]   Name: Meghan Williamson MRN: [id number] Date of Birth: [birth year]

## 2021-07-06 ENCOUNTER — Inpatient Hospital Stay: Payer: BC Managed Care – PPO | Admitting: Neurology

## 2021-08-19 ENCOUNTER — Encounter: Payer: Self-pay | Admitting: Neurology

## 2021-08-19 ENCOUNTER — Ambulatory Visit (INDEPENDENT_AMBULATORY_CARE_PROVIDER_SITE_OTHER): Payer: BC Managed Care – PPO | Admitting: Neurology

## 2021-08-19 VITALS — BP 111/70 | HR 62 | Ht 66.0 in | Wt 124.5 lb

## 2021-08-19 DIAGNOSIS — I6990 Unspecified sequelae of unspecified cerebrovascular disease: Secondary | ICD-10-CM

## 2021-08-19 DIAGNOSIS — Z8673 Personal history of transient ischemic attack (TIA), and cerebral infarction without residual deficits: Secondary | ICD-10-CM

## 2021-08-19 NOTE — Patient Instructions (Addendum)
I had a long d/w patient and her mother about her recent cryptogenic strokes and discussed results of follow-up MRI, lab work and TCD bubble study and, risk for recurrent stroke/TIAs, personally independently reviewed imaging studies and stroke evaluation results and answered questions.Continue aspirin 81 mg daily  for secondary stroke prevention and maintain strict control of hypertension with blood pressure goal below 130/90, diabetes with hemoglobin A1c goal below 6.5% and lipids with LDL cholesterol goal below 70 mg/dL. I also advised the patient to eat a healthy diet with plenty of whole grains, cereals, fruits and vegetables, exercise regularly and maintain ideal body weight .patient may discontinue Cerefolin as her homocystine levels were normal and switch to regular B12 tablets.  Recommend to check B12 level in a few weeks with her primary care physician.  Patient was counseled to never go back on birth control pills and voiced understanding.  Followup in the future with me in 6 months or call earlier if necessary.

## 2021-08-19 NOTE — Progress Notes (Signed)
[facility] [address] [place]. [place] [address] [phone]       OFFICE CONSULT NOTE  [name] Date of Birth:  2000-12-26 Medical Record Number:  [id number]   Referring MD:  Marvel Plan  Reason for Referral:  stroke  HPI: Initial visit 05/24/2021:Meghan Williamson is a pleasant 20 year old Caucasian girl seen today for initial office consultation visit for stroke.  History is obtained from the patient and her mother who is accompanying her as well as review of electronic medical records.  I personally reviewed available imaging films in PACS.Meghan Williamson is a 20 y.o. female with a medical history significant for asthma, intermittent seasonal allergies, and occasional marijuana and tobacco use who presented to the ED on 5/29 for evaluation of expressive aphasia. Patient states that she was driving home from work at around 02:00 on 5/29 when she had a mild left frontal headache. She arrived home from work and shortly after, her sister was speaking with her and noticed that she was having trouble speaking. Her speech was described as gibberish and unintelligible and somewhat dysarthric. Meghan Williamson attempted to text her mother at that time with a text message that did not make sense. She went to bed and when she woke up, she was able to communicate with her mother until her mother began asking her questions about her schedule and noticed that she had some residual word-finding difficulties. She was then taken to urgent care and sent to the ED for further evaluation. Meghan Williamson takes OCP medication for management of her menstrual cycle and took an ibuprofen with her headache but denies taking other medications.  CT scan of the head on admission showed no acute abnormality but MRI showed a moderate sized left MCA, right PCA medial temporal, right cerebellar and right frontal punctate acute infarcts.  MR angiogram showed subtle diffuse small vessel irregularity in the intracranial  circulation.  Patient underwent diagnostic cerebral catheter angiogram on 04/13/2021 but it did not show evidence of vasculitis, spasm or stenosis or aneurysms.  Spinal tap was also performed which was unremarkable without any elevated cells or abnormal protein or glucose.  2D echo showed ejection fraction of 60 to 65%.  Lower EXTR Dopplers were negative for DVT.  Urine drug screen was positive for marijuana.  Transesophageal echocardiogram was performed which showed no evidence of PFO or cardiac source of embolism.  LDL cholesterol was 89 mg percent.  Hemoglobin A1c was quantity insufficient.  Vasculitic labs were all negative.  Hypercoagulable panel labs were negative except for factor V Leiden heterozygote state and slightly less protein acid level but total protein S activity was normal.  Patient had been taking birth control pills for heavy menstruation and has been smoking a few cigarettes a week.  She was advised to stop this.  She was discharged on aspirin and Plavix for 3 weeks followed by aspirin alone.  She started on Lipitor 10 mg as well which is tolerating well without side effects.  Patient has seen Dr. Leonides Schanz as an outpatient for hematology consultation who felt she did not need long-term anticoagulation and was okay on antiplatelet therapy.  Patient had low vitamin B12 of 88 for which she is started on B12 supplementation.  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  Patient has no history of DVT, pulmonary embolism, miscarriages.  She does not  have history of migraine headaches.  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  She is working currently and [employer]..  She denies doing drugs , marijuana and alcohol and has now quit smoking Update 08/19/2021 ; she returns for follow-up after last visit  3 months ago.  She is accompanied by her mother.  Patient states she is doing well she has had no recurrent stroke or TIA symptoms.  She still having some minor cognitive issues like decreased short-term memory recall and doing [place].  She is back in school at [employer] state and also working part-time.  She has stopped the birth control pills.  She does not smoke or drink.  She had follow-up lab work on 05/13/2021 which showed normal protein S levels with slightly diminished protein S activity.  MRI scan of the brain on 05/27/2021 showed remote age left temporoparietal infarct with some cortical laminar necrosis and small left medial anterior thalamic, medial temporal and patchy PCA territory infarcts.  There was no abnormal enhancement or new infarcts noted.  Compared to previous MRI from 04/12/2021 there were only expected evolutionary changes.  MR angiogram of the brain was normal.  Transcranial Doppler bubble study done on 06/09/2021 was negative for right-to-left shunt.  Patient is tolerating aspirin well without bleeding or bruising and Lipitor without muscle aches and pains.  She is also on vitamin B12 replacement.  She has no new complaints. ROS:   14 system review of systems is positive for short-term memory and calculation   difficulties, mixing up words, memory loss  PMH:  Past Medical History:  Diagnosis Date   Anxiety    Asthma    Stroke [facility])     Social History:  Social History   Socioeconomic History   Marital status: Single    Spouse name: Not on file   Number of children: Not on file   Years of education: Not on file   Highest education level: Not on file  Occupational History   Not on file  Tobacco Use   Smoking status: Some Days   Smokeless tobacco: Never  Vaping Use   Vaping Use: Some days  Substance and Sexual Activity   Alcohol use: Never   Drug use: Yes    Types: Marijuana   Sexual activity: Not on file  Other Topics Concern   Not on file  Social History  Narrative   Lives with parents and siblings   Right Handed   Drinks very little caffeine   Social Determinants of Health   Financial Resource Strain: Not on file  Food Insecurity: Not on file  Transportation Needs: Not on file  Physical Activity: Not on file  Stress: Not on file  Social Connections: Not on file  Intimate Partner Violence: Not on file    Medications:   Current Outpatient Medications on File Prior to Visit  Medication Sig Dispense Refill   acetaminophen (TYLENOL) 325 MG tablet 1 tablet as needed for mild pain or headache     albuterol (VENTOLIN HFA) 108 (90 Base) MCG/ACT inhaler 1 puff     aspirin EC 81 MG EC tablet Take 1 tablet (81 mg total) by mouth daily. Swallow whole. 30 tablet 11   atorvastatin (LIPITOR) 10 MG tablet Take 2 tablets (20 mg total) by mouth at bedtime. 90 tablet 2   loratadine (CLARITIN) 5 MG chewable tablet 2 tablets     No current facility-administered medications on file prior  to visit.    Allergies:  No Known Allergies  Physical Exam General: Pleasant frail young Caucasian girl, seated, in no evident distress Head: head normocephalic and atraumatic.   Neck: supple with no carotid or supraclavicular bruits Cardiovascular: regular rate and rhythm, no murmurs Musculoskeletal: no deformity Skin:  no rash/petichiae Vascular:  Normal pulses all extremities  Neurologic Exam Mental Status: Awake and fully alert. Oriented to place and time. Recent and remote memory intact. Attention span, concentration and fund of knowledge appropriate. Mood and affect appropriate.  Recall 3/3.  Able to name 14 animals which can walk on 4 legs.  Clock drawing 4/4. Cranial Nerves: Fundoscopic exam not done. Pupils equal, briskly reactive to light. Extraocular movements full without nystagmus. Visual fields full to confrontation. Hearing intact. Facial sensation intact. Face, tongue, palate moves normally and symmetrically.  Motor: Normal bulk and tone. Normal  strength in all tested extremity muscles. Sensory.: intact to touch , pinprick , position and vibratory sensation.  Coordination: Rapid alternating movements normal in all extremities. Finger-to-nose and heel-to-shin performed accurately bilaterally. Gait and Station: Arises from chair without difficulty. Stance is normal. Gait demonstrates normal stride length and balance . Able to heel, toe and tandem walk without difficulty.  Reflexes: 1+ and symmetric. Toes downgoing.   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  vascular risk factors borderline hyperlipidemia, smoking and marijuana use and.  Heterozygote state for factor V Leiden without evidence of DVT.   low vitamin B12 unclear if this has contributed     PLAN: I had a long d/w patient and her mother about her recent cryptogenic strokes and discussed results of follow-up MRI, lab work and TCD bubble study and, risk for recurrent stroke/TIAs, personally independently reviewed imaging studies and stroke evaluation results and answered questions.Continue aspirin 81 mg daily  for secondary stroke prevention and maintain strict control of hypertension with blood pressure goal below 130/90, diabetes with hemoglobin A1c goal below 6.5% and lipids with LDL cholesterol goal below 70 mg/dL. I also advised the patient to eat a healthy diet with plenty of whole grains, cereals, fruits and vegetables, exercise regularly and maintain ideal body weight .patient may discontinue Cerefolin as her homocystine levels were normal and switch to regular B12 tablets.  Recommend to check B12 level in a few weeks with her primary care physician.  Patient was counseled to never go back on birth control pills and voiced understanding.  Followup in the future with me in 6 months or call earlier if necessary.Greater than 50% time during this 25-minute  visit was spent  on counseling and coordination of care about her cryptogenic stroke and discussion about her factor V Leiden mutation heterozygous state and answering questions Delia Heady, MD  Note: This document was prepared with digital dictation and possible smart phrase technology. Any transcriptional errors that result from this process are unintentional.

## 2021-09-28 ENCOUNTER — Telehealth: Payer: Self-pay | Admitting: Neurology

## 2021-09-28 DIAGNOSIS — R413 Other amnesia: Secondary | ICD-10-CM

## 2021-09-28 DIAGNOSIS — I639 Cerebral infarction, unspecified: Secondary | ICD-10-CM

## 2021-09-28 NOTE — Telephone Encounter (Signed)
Pt returned call , please call back   °

## 2021-09-28 NOTE — Telephone Encounter (Signed)
Left message for her mother on DPR to return our call. Need to clarify her request.

## 2021-09-28 NOTE — Telephone Encounter (Signed)
Pt's mother called wanting a referral for [employer] for some accomodation for college. Pt's mother requesting a call back.

## 2021-09-28 NOTE — Telephone Encounter (Signed)
I spoke to the patient's mother. She went back to college this semester and noticed a few deficits (issues w/ focus, memory, recall). She is passing all her classes. However, her school counselor recommended a neuropsychiatric evaluation in case she needs accommodations for her classes.

## 2021-09-29 NOTE — Addendum Note (Signed)
Addended by: Lindell Spar C on: 09/29/2021 09:20 AM   Modules accepted: Orders

## 2021-09-29 NOTE — Telephone Encounter (Signed)
Referral placed in Epic.

## 2021-11-10 ENCOUNTER — Encounter: Payer: BC Managed Care – PPO | Attending: Psychology | Admitting: Psychology

## 2021-11-10 ENCOUNTER — Other Ambulatory Visit: Payer: Self-pay

## 2021-11-10 ENCOUNTER — Encounter: Payer: Self-pay | Admitting: Psychology

## 2021-11-10 DIAGNOSIS — R413 Other amnesia: Secondary | ICD-10-CM

## 2021-11-10 DIAGNOSIS — R4189 Other symptoms and signs involving cognitive functions and awareness: Secondary | ICD-10-CM | POA: Diagnosis not present

## 2021-11-10 DIAGNOSIS — I6990 Unspecified sequelae of unspecified cerebrovascular disease: Secondary | ICD-10-CM

## 2021-11-10 NOTE — Progress Notes (Signed)
Neuropsychological Consultation   Patient:   Meghan Williamson   DOB:   2000/12/27  MR Number:  [id number]  Location:  [facility] PH[facility]ICINE AND REHABILITATION [address] [place], [address] [id number] [facility] [place] [address] Dept: [phone]           Date of Service:   11/10/2021  Start Time:   3 PM End Time:   5 PM  Today's visit was an in person visit that was conducted in my outpatient clinic office.  The patient, her mother and myself were present for this clinical interview.  1 hour and 15 minutes was spent in face-to-face clinical interview and the other 45 minutes was spent with records review, report writing and setting up testing protocols  Provider/Observer:  Arley Phenix, Psy.D.       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  The patient has a past medical history that includes asthma, intermittent seasonal allergies and occasional marijuana use and decreased but continued vaping of tobacco.  Patient presented to the emergency department on 04/11/2021 for evaluation of expressive aphasia.  MRI showed a moderate sized left MCA, right PCA medial temporal, right cerebellar and right frontal punctuate acute infarct.  Patient did undergo cerebral catheter angiogram on 5/31 with no particular abnormality found.  Patient's urine drug screen was positive for marijuana.  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  Patient had no prior history of DVT, pulmonary embolism, miscarriage etc. but was taking birth control.  The patient has gone through outpatient rehabilitation services  and made significant improvements but continues to describe issues with attention and concentration, difficulty with new learning and difficulty maintaining and staying on task.  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  The patient has a past medical history that includes asthma, intermittent seasonal allergies and occasional marijuana use and decreased but continued vaping of tobacco.  Patient presented to the emergency department on 04/11/2021 for evaluation of expressive aphasia.  MRI showed a moderate sized left MCA, right PCA medial temporal, right cerebellar and right frontal punctuate acute infarct.  Patient did undergo cerebral catheter angiogram on 5/31 with no particular abnormality found.  Patient's urine drug screen was positive for marijuana.  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  Patient had no prior history of DVT, pulmonary embolism, miscarriage etc. but was taking birth control.  The patient has gone through outpatient rehabilitation services and made significant improvements but continues to describe issues with attention and concentration, difficulty with new learning and difficulty maintaining and staying on task.  Events the night of the stroke includes the patient reported that she was driving home from work around 2:00 on 529 and noticed a mild left frontal headache.  After arriving home from work her sister noted that the patient was having trouble speaking.  Her speech was described as gibberish and unintelligible and somewhat dysarthric.  The patient also attempted to text her  mother at the time with unintelligible text being produced.  Patient went to bed and when she woke up she was able to  communicate with her mother until her mother started asking questions about her schedule and noted that there was some residual word finding difficulties.  Patient was taken to urgent care and then transferred to the emergency department for further evaluation.  Follow-up appointments with the neurologist in October showed that the patient and her mother reports that she is doing much better and has had no recurrent stroke or TIA type symptoms.  Ongoing cognitive issues are noted including problems with short-term memory and problem-solving.  The patient had returned to [employer] state and was also working part-time at school.  Patient stopped taking birth control pills.  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  No new infarcts were noted.  Comparisons to MRI from 5/30 only noted expected evolutionary changes from her earlier stroke.  MRA of brain was normal.  During the clinical interview today, the patient reports that she has been having difficulty sitting down and getting her work done and maintaining focus at school.  Patient reports that she has a harder time understanding what she is trying to learn in school.  The patient reports that she returned to [employer] for the fall semester and went in thinking that it would be just like before but started noticing that she has a hard time doing work even if she wants to.  She reports that she gets easily distracted and the fact that she is living with 3 others means there is a lot of other things going on to distract her.  The patient reports that when she is distracted she has trouble getting back on track and maintaining focus for any length of time.  The patient reports that when she is sitting in class that she has trouble focusing and has decreased comprehension and often has difficulty  understanding what the teacher is trying to explain in classes such as her math class.  The patient reports that she was never particularly good at math but now that she has great difficulty doing things that she knows she should have been able to do.  The patient reports that she just has difficulty connecting with what the teachers are saying.  The patient's mother reports that she saw significant improvements over the summer during the patient's rehabilitation efforts.  The patient's expressive language has significantly improved particularly around word findings and effective use of pronouns and other grammatical structures improving.  The patient's mother reports that she still notices her daughter getting lost when trying to make a point and will be halfway through a sentence and have difficulty staying on track.  The patient reports that she has significant difficulty with her sleep and that she has no particular sleep schedule but takes no naps during the day.  She reports that she just sleeps when she is tired and she has a really difficult time maintaining a sleep schedule.  The patient also reports that she has a very particular appetite and she only eats things that she likes and that she never eats breakfast or lunch.  Behavioral Observation: Meghan Williamson  presents as a 21 y.o.-year-old Right handed Caucasian Female who appeared her stated age. her dress was Appropriate and she was Well Groomed and her manners were Appropriate to the  situation.  her participation was indicative of Appropriate behaviors.  There were not physical disabilities noted.  she displayed an appropriate level of cooperation and motivation.     Interactions:    Active Appropriate  Attention:   abnormal and attention span appeared shorter than expected for age  Memory:   abnormal; remote memory intact, recent memory impaired  Visuo-spatial:  not examined  Speech (Volume):  normal  Speech:   normal; patient mother  report some decrease in word finding abilities but do note significant improvement and a significant return approaching baseline.  Thought Process:  Coherent and Relevant  Though Content:  WNL; not suicidal and not homicidal  Orientation:   person, place, time/date, and situation  Judgment:   Good  Planning:   Good  Affect:    Appropriate  Mood:    Euthymic  Insight:   Good  Intelligence:   high  Marital Status/Living: Patient was born and raised in [facility] [facility] [place] along with 2 siblings.  No significant childhood illnesses were noted and the patient reached developmental milestones at the appropriate time.  Patient has returned to college at [employer] and lives with her roommates.  She primarily resides at home when not in school and lives with her mother, father and sister.  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  Substance Use:  No concerns of substance abuse are reported.  The patient does have a history of some marijuana use and reports that she is significantly reduced any marijuana use.  She reports that she also uses some tobacco products (vaping) but has been working on stopping that as well.  She denies any alcohol use.  Education:   Patient is now a sophomore at [employer] and graduated from [employer].  Extracurricular activities include soccer and swimming.  Medical History:   Past Medical History:  Diagnosis Date   Anxiety    Asthma    Stroke [facility])          Patient Active Problem List   Diagnosis Date Noted   Acute CVA (cerebrovascular accident) (HCC) 04/12/2021   Dysarthria 04/11/2021   Memory difficulties 04/11/2021              Abuse/Trauma History: No significant history of abuse or trauma noted.  Psychiatric History:  Patient does have a history of some anxiety that is primarily associated with  impacts of her cerebrovascular accident.  Family Med/Psych History: History reviewed. No pertinent family history.   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  The patient has a past medical history that includes asthma, intermittent seasonal allergies and occasional marijuana use and decreased but continued vaping of tobacco.  Patient presented to the emergency department on 04/11/2021 for evaluation of expressive aphasia.  MRI showed a moderate sized left MCA, right PCA medial temporal, right cerebellar and right frontal punctuate acute infarct.  Patient did undergo cerebral catheter angiogram on 5/31 with no particular abnormality found.  Patient's urine drug screen was positive for marijuana.  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  Patient had no prior history of DVT, pulmonary embolism, miscarriage etc. but was taking birth control.  The patient  has gone through outpatient rehabilitation services and made significant improvements but continues to describe issues with attention and concentration, difficulty with new learning and difficulty maintaining and staying on task.  Disposition/Plan:  We have set the patient up for formal neuropsychological testing.  She will complete the Wechsler Adult Intelligence Scale, the Wechsler Memory Scale's, the controlled oral Word Association test, and the comprehensive attention battery/ CAB CPT measures to provide a thorough structured assessment of a wide range of cognitive functioning with particular focus on attention and concentration variables in memory and learning variables.  Once this assessment is completed a formal report will be produced with pertinent recommendations with  particular focus on recommendations around her return to school and any adaptations that the school may be able to make to help the patient more effectively complete her education.  Diagnosis:    Late effects of cerebrovascular accident  Cognitive deficits  Memory difficulties         Electronically Signed   _______________________ Arley Phenix, Psy.D. Clinical Neuropsychologist

## 2021-11-16 ENCOUNTER — Other Ambulatory Visit: Payer: Self-pay

## 2021-11-16 ENCOUNTER — Encounter: Payer: BC Managed Care – PPO | Attending: Psychology

## 2021-11-16 DIAGNOSIS — R4189 Other symptoms and signs involving cognitive functions and awareness: Secondary | ICD-10-CM | POA: Insufficient documentation

## 2021-11-16 DIAGNOSIS — R413 Other amnesia: Secondary | ICD-10-CM | POA: Insufficient documentation

## 2021-11-16 DIAGNOSIS — I6990 Unspecified sequelae of unspecified cerebrovascular disease: Secondary | ICD-10-CM | POA: Insufficient documentation

## 2021-11-16 NOTE — Progress Notes (Signed)
Behavioral Observations   The patient appeared well-groomed and appropriately dressed. Her manners were polite and appropriate. She demonstrated a positive attitude toward testing as well as good effort. The patient complied with all testing instructions. The patient's speech was low in volume and somewhat slow in pace.  Neuropsychology Note  Meghan Williamson completed 210 minutes of neuropsychological testing with technician, Marica Otter, BA, under the supervision of Arley Phenix, PsyD., Clinical Neuropsychologist. The patient did not appear overtly distressed by the testing session, per behavioral observation or via self-report to the technician. Rest breaks were offered.   Clinical Decision Making: In considering the patient's current level of functioning, level of presumed impairment, nature of symptoms, emotional and behavioral responses during clinical interview, level of literacy, and observed level of motivation/effort, a battery of tests was selected by Dr. Kieth Brightly during initial consultation on 11/10/2021. This was communicated to the technician. Communication between the neuropsychologist and technician was ongoing throughout the testing session and changes were made as deemed necessary based on patient performance on testing, technician observations and additional pertinent factors such as those listed above.  Tests Administered: Controlled Oral Word Association Test (COWAT; FAS & Animals)  Wechsler Adult Intelligence Scale, 4th Edition (WAIS-IV) Wechsler Memory Scale, 4th Edition (WMS-IV); Adult Battery or Older Adult Battery   Results:  COWAT FAS Total = 25 Z = -1.76 Animals Total = 16 Z = -1.09     WAIS  Composite Score Summary  Scale Sum of Scaled Scores Composite Score Percentile Rank 95% Conf. Interval Qualitative Description  Verbal Comprehension 27 VCI 95 37 90-101 Average  Perceptual Reasoning 23 PRI 86 18 80-93 Low Average  Working Memory 9 WMI 69 2  64-78 Extremely Low  Processing Speed 16 PSI 89 23 82-98 Low Average  Full Scale 75 FSIQ 82 12 78-86 Low Average  General Ability 50 GAI 89 23 84-94 Low Average        Verbal Comprehension Subtests Summary  Subtest Raw Score Scaled Score Percentile Rank Reference Group Scaled Score SEM  Similarities 21 8 25 8  1.16  Vocabulary 33 11 63 9 0.79  Information 9 8 25 8  0.90  (Comprehension) 23 10 50 10 1.08       Perceptual Reasoning Subtests Summary  Subtest Raw Score Scaled Score Percentile Rank Reference Group Scaled Score SEM  Block Design 28 6 9 6  1.08  Matrix Reasoning 16 8 25 8  1.08  Visual Puzzles 15 9 37 10 0.99  (Figure Weights) 14 9 37 9 0.85  (Picture Completion) 14 10 50 10 1.20       Working Raw Score Scaled Score Percentile Rank Reference Group Scaled Score SEM  Digit Span 14 3 1 3  0.85  Arithmetic 9 6 9 6  1.04  (Letter-Number Seq.) 10 3 1 4  0.95       Processing Speed Subtests Summary  Subtest Raw Score Scaled Score Percentile Rank Reference Group Scaled Score SEM  Symbol Search 27 8 25 8  1.31  Coding 62 8 25 8  1.16  (Cancellation) 28 6 9 6  1.31      WMS    Index Score Summary  Index Sum of Scaled Scores Index Score Percentile Rank 95% Confidence Interval Qualitative Descriptor  Auditory Memory (AMI) 23 75 5 70-82 Borderline  Visual Memory (VMI) 32 87 19 82-93 Low Average  Visual Working Memory (VWMI) 19 97 42 90-104 Average  Immediate Memory (IMI) 27 78 7 73-85 Borderline  Delayed Memory (DMI)  28 80 9 74-88 Low Average      Primary Subtest Scaled Score Summary  Subtest Domain Raw Score Scaled Score Percentile Rank  Logical Memory I AM 15 5 5   Logical Memory II AM 13 6 9   Verbal Paired Associates I AM 17 4 2   Verbal Paired Associates II AM 10 8 25   Designs I VM 80 9 37  Designs II VM 59 8 25  Visual Reproduction I VM 38 9 37  Visual Reproduction II VM 19 6 9   Spatial Addition VWM 18 11 63  Symbol  Span VWM 23 8 25       Auditory Memory Process Score Summary  Process Score Raw Score Scaled Score Percentile Rank Cumulative Percentage (Base Rate)  LM II Recognition 19 - - 3-9%  VPA II Recognition 38 - - 17-25%       Visual Memory Process Score Summary  Process Score Raw Score Scaled Score Percentile Rank Cumulative Percentage (Base Rate)  DE I Content 40 10 50 -  DE I Spatial 16 7 16  -  DE II Content 40 11 63 -  DE II Spatial 11 7 16  -  DE II Recognition 20 - - >75%  VR II Recognition 6 - - 26-50%      ABILITY-MEMORY ANALYSIS  Ability Score:  VCI: 95 Date of Testing:  WAIS-IV; WMS-IV 2021/11/16  Predicted Difference Method   Index Predicted WMS-IV Index Score Actual WMS-IV Index Score Difference Critical Value  Significant Difference Y/N Base Rate  Auditory Memory 97 75 22 9.36 Y 5%  Visual Memory 98 87 11 7.48 Y 20-25%  Visual Working Memory 97 97 0 11.49 N   Immediate Memory 97 78 19 9.45 Y 5-10%  Delayed Memory 97 80 17 9.58 Y 10%  Statistical significance (critical value) at the .01 level.        Feedback to Patient: Meghan Williamson will return on 11/18/2021 for further testing and again on 04/04/2022 for an interactive feedback session with Dr. at which time her test performances, clinical impressions and treatment recommendations will be reviewed in detail. The patient understands she can contact our office should she require our assistance before this time.  210 minutes spent face-to-face with patient administering standardized tests, 30 minutes spent scoring ). [CPT , 96139]  Full report to follow.

## 2021-11-18 ENCOUNTER — Other Ambulatory Visit: Payer: Self-pay

## 2021-11-18 DIAGNOSIS — I6990 Unspecified sequelae of unspecified cerebrovascular disease: Secondary | ICD-10-CM

## 2021-11-18 NOTE — Progress Notes (Signed)
° °  Behavioral Observation  The patient appeared well-groomed and appropriately dressed. Her manners were polite and appropriate to the situation.  Neuropsychology Note  Lizbett Garciagarcia completed 60 minutes of neuropsychological testing with technician, Marica Otter, BA, under the supervision of Arley Phenix, PsyD., Clinical Neuropsychologist. The patient did not appear overtly distressed by the testing session, per behavioral observation or via self-report to the technician. Rest breaks were offered.   Clinical Decision Making: In considering the patient's current level of functioning, level of presumed impairment, nature of symptoms, emotional and behavioral responses during clinical interview, level of literacy, and observed level of motivation/effort, a battery of tests was selected by Dr. Kieth Brightly during initial consultation on 11/10/2021. This was communicated to the technician. Communication between the neuropsychologist and technician was ongoing throughout the testing session and changes were made as deemed necessary based on patient performance on testing, technician observations and additional pertinent factors such as those listed above.  Tests Administered: Comprehensive Attention Battery (CAB) Continuous Performance Test (CPT)  Results: Will be included in final report   Feedback to Patient: Tauni Sanks will return on 04/04/2022 for an interactive feedback session with Dr. Kieth Brightly at which time her test performances, clinical impressions and treatment recommendations will be reviewed in detail. The patient understands she can contact our office should she require our assistance before this time.  60 minutes spent face-to-face with patient administering standardized tests, 30 minutes spent scoring Radiographer, therapeutic). [CPT [id number], [id number]]  Full report to follow.

## 2021-11-24 ENCOUNTER — Encounter: Payer: Self-pay | Admitting: Psychology

## 2021-11-24 ENCOUNTER — Other Ambulatory Visit: Payer: Self-pay

## 2021-11-24 ENCOUNTER — Encounter (HOSPITAL_BASED_OUTPATIENT_CLINIC_OR_DEPARTMENT_OTHER): Payer: BC Managed Care – PPO | Admitting: Psychology

## 2021-11-24 DIAGNOSIS — R4189 Other symptoms and signs involving cognitive functions and awareness: Secondary | ICD-10-CM | POA: Diagnosis not present

## 2021-11-24 DIAGNOSIS — I6990 Unspecified sequelae of unspecified cerebrovascular disease: Secondary | ICD-10-CM

## 2021-11-24 DIAGNOSIS — R413 Other amnesia: Secondary | ICD-10-CM | POA: Diagnosis not present

## 2021-11-24 NOTE — Progress Notes (Addendum)
Neuropsychological Evaluation   Patient:  Meghan Williamson   DOB: 03-19-2001  MR Number: [id number]  Location: [facility] [place] [facility] [address] 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  The patient has a past medical history that includes asthma, intermittent seasonal allergies and occasional marijuana use and decreased but continued vaping of tobacco products.  Patient presented to the emergency department on 04/11/2021 for evaluation of expressive aphasia.  MRI showed a moderate sized left MCA, right PCA medial temporal, right cerebellar and right frontal punctuate acute infarct.  Patient did undergo cerebral catheter angiogram on 5/31 with no particular abnormality found.  Patient's urine drug screen was positive for marijuana.  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  Patient had no prior history of DVT, pulmonary embolism, miscarriage etc. but was taking birth control.  The patient has gone through outpatient rehabilitation services and made significant improvements but continues to describe issues with attention and concentration, difficulty with new learning and difficulty maintaining and staying on task.  Events the night of the stroke includes the patient reported that she was driving home from work around 2:00 on 529 and  noticed a mild left frontal headache.  After arriving home from work her sister noted that the patient was having trouble speaking.  Her speech was described as gibberish and unintelligible and somewhat dysarthric.  The patient also attempted to text her mother at the time with unintelligible text being produced.  Patient went to bed and when she woke up she was able to communicate with her mother until her mother started asking questions about her schedule and noted that there was some residual word finding difficulties.  Patient was taken to urgent care and then transferred to the emergency department for further evaluation.  Follow-up appointments with the neurologist in October showed that the patient and her mother reports that she is doing much better and has had no recurrent stroke or TIA type symptoms.  Ongoing cognitive issues are noted including problems with short-term memory and problem-solving.  The patient had returned to [employer] and was also working part-time at school.  Patient stopped taking birth control pills.  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  No new infarcts were noted.  Comparisons to MRI from 5/30 only noted expected evolutionary changes from her earlier stroke.  MRA of brain was normal.  During the clinical interview today, the patient reports that she has been having difficulty sitting down and getting her work done and maintaining focus at school.  Patient reports that she has a harder time understanding what she is trying to learn in school.  The patient reports that she returned to [employer] for the fall semester and went in  thinking that it would be just like before but started noticing that she has a hard time doing work even if she wants to.  She reports that she gets easily distracted and the fact  that she is living with 3 others means there is a lot of other things going on to distract her.  The patient reports that when she is distracted she has trouble getting back on track and maintaining focus for any length of time.  The patient reports that when she is sitting in class that she has trouble focusing and has decreased comprehension and often has difficulty understanding what the teacher is trying to explain in classes such as her math class.  The patient reports that she was never particularly good at math but now that she has great difficulty doing things that she knows she should have been able to do.  The patient reports that she just has difficulty connecting with what the teachers are saying.  The patient's mother reports that she saw significant improvements over the summer during the patient's rehabilitation efforts.  The patient's expressive language has significantly improved particularly around word findings and effective use of pronouns and other grammatical structures improving.  The patient's mother reports that she still notices her daughter getting lost when trying to make a point and will be halfway through a sentence and have difficulty staying on track.  The patient reports that she has significant difficulty with her sleep and that she has no particular sleep schedule but takes no naps during the day.  She reports that she just sleeps when she is tired and she has a really difficult time maintaining a sleep schedule.  The patient also reports that she has a very particular appetite and she only eats things that she likes and that she never eats breakfast or lunch.  Tests Administered: Controlled Oral Word Association Test (COWAT; FAS & Animals)  Wechsler Adult Intelligence Scale, 4th Edition (WAIS-IV) Wechsler Memory Scale, 4th Edition (WMS-IV); Adult Battery or Older Adult Battery  Comprehensive Attention Battery (CAB) Continuous Performance Test (CPT)  Participation  Level:   Active  Participation Quality:  Appropriate      Behavioral Observation:  The patient appeared well-groomed and appropriately dressed. Her manners were polite and appropriate. She demonstrated a positive attitude toward testing as well as good effort. The patient complied with all testing instructions. The patient's speech was low in volume and somewhat slow in pace.  Well Groomed, Alert, and Appropriate.   Test Results:   Initially, an estimation was made as to the patient's premorbid intellectual and cognitive functioning to provide a comparison point and interpreting current objective cognitive testing measures.  The patient graduated from high school and was admitted to [employer] for undergraduate education.  Given the patient's educational history, we can conservatively estimate that the patient was easily in the upper end of the average range to high average range relative to a normative population in intellectual and cognitive functioning.  We will utilize a conservative estimate of premorbid functioning to have been around a 105 standard score on most cognitive domains and the upper end of the average range to low end of the high average range relative to a normative population.  This is likely a conservative estimation but we do not want to over interpret resulting cognitive performances on current assessments.  COWAT FAS Total = 25 Z = -1.76 Animals Total = 16 Z = -1.09   The patient was initially administered the  control oral Word Association test to get a better estimate of current expressive language functioning.  On the FAS test which assesses lexical fluency variables the patient was able to self generate 25 total words over the total task which is 1.7 standard deviations below normative expectations.  On the animal naming test which assesses somatic fluency the patient identified her generated 16 Target names of animals in 60 seconds.  This is just over 1  standard deviation below normative expectations.  This would suggest that while the patient has made significant improvements in her expressive language functioning's post stroke there continue to be objective findings of deficits for verbal fluency and word finding abilities.     WAIS             Composite Score Summary         Scale Sum of Scaled Scores Composite Score Percentile Rank 95% Conf. Interval Qualitative Description  Verbal Comprehension 27 VCI 95 37 90-101 Average  Perceptual Reasoning 23 PRI 86 18 80-93 Low Average  Working Memory 9 WMI 69 2 64-78 Extremely Low  Processing Speed 16 PSI 89 23 82-98 Low Average  Full Scale 75 FSIQ 82 12 78-86 Low Average  General Ability 50 GAI 89 23 84-94 Low Average      The patient was then administered the Wechsler Adult Intelligence Scale-IV to get an objective assessment of a wide range of cognitive functioning domains.  This measure was not used to describe her premorbid functioning as the patient has had significant cerebrovascular event and is describing specific changes in a wide range of cognitive domains.  This measure should be used to describe her current functioning level and not as an accurate description of premorbid functioning.  The patient produced a full-scale IQ score of 82 which describes her current global cognitive functioning levels.  This performance falls at the 12th percentile relative to a normative population and is nearing 2 standard deviations below predicted levels of premorbid functioning.  This would suggest that multiple areas of cognitive functioning have been in impacted by her recent cerebrovascular accident.  We also calculated the patient's general abilities index score which places less emphasis on variables such as auditory encoding and information processing speed.  The patient produced a general abilities index score of 89 which falls at the 23rd percentile and is in the low average range relative to a  normative population.           Verbal Comprehension Subtests Summary      Subtest Raw Score Scaled Score Percentile Rank Reference Group Scaled Score SEM  Similarities 21 8 25 8  1.16  Vocabulary 33 11 63 9 0.79  Information 9 8 25 8  0.90  (Comprehension) 23 10 50 10 1.08      The patient produced a verbal comprehension index score of 95 which falls at the 37th percentile and is in the average range.  This global performance and verbal comprehension skills was only slightly below predicted levels and suggest that this area of functioning is generally more well-maintained.  There was some significant scatter noted in subtest performance.  The patient performed in the average her upper end of the average range with regard to her vocabulary knowledge and her social judgment comprehension and performed below predicted levels on measures of verbal reasoning and problem-solving and the availability of general fund of information             Psychiatrist Subtests Summary     Subtest  Raw Score Scaled Score Percentile Rank Reference Group Scaled Score SEM  Block Design 28 6 9 6  1.08  Matrix Reasoning 16 8 25 8  1.08  Visual Puzzles 15 9 37 10 0.99  (Figure Weights) 14 9 37 9 0.85  (Picture Completion) 14 10 50 10 1.20    The patient produced a perceptual reasoning index score of 86 which falls at the 18th percentile and is in the low average range relative to a normative population.  This is also approaching nearly 2 standard deviations reduction from estimates of premorbid functioning and suggest some significant change in these cognitive domains.  The patient did relatively well on measures assessing her ability to identify visual anomalies within a visual gestalt as well as some estimations of visual estimation and visual judgment capacity.  The patient had significant weaknesses on measures of visual analysis and organization and some issues or aspects of visual reasoning and  problem-solving.             Working Conservator, museum/gallery Raw Score Scaled Score Percentile Rank Reference Group Scaled Score SEM  Digit Span 14 3 1 3  0.85  Arithmetic 9 6 9 6  1.04  (Letter-Number Seq.) 10 3 1 4  0.95      The patient produced a working memory index score of 69 which falls in the 2nd percentile and is in the extremely low range relative to a normative population.  This is significantly below predicted levels of premorbid functioning suggesting significant difficulties with auditory encoding and her ability to actively manipulate and process actively stored information in her register.             Processing Speed Subtests Summary     Subtest Raw Score Scaled Score Percentile Rank Reference Group Scaled Score SEM  Symbol Search 27 8 25 8  1.31  Coding 62 8 25 8  1.16  (Cancellation) 28 6 9 6  1.31    The patient produced a processing speed index score of 89 which falls at the 23rd percentile and is in the low average range.  This is roughly 1 standard deviation below predicted levels.  There was no significant variability in subtest performance showing mild weaknesses with regard to visual scanning, visual searching and overall speed of mental operations.     WMS             Index Score Summary       Index Sum of Scaled Scores Index Score Percentile Rank 95% Confidence Interval Qualitative Descriptor  Auditory Memory (AMI) 23 75 5 70-82 Borderline  Visual Memory (VMI) 32 87 19 82-93 Low Average  Visual Working Memory (VWMI) 19 97 42 90-104 Average  Immediate Memory (IMI) 27 78 7 73-85 Borderline  Delayed Memory (DMI) 28 80 9 74-88 Low Average    The patient was then administered the Wechsler Memory Scale-IV to provide an objective structured assessment of a wide range of memory and learning functions.  On the Wechsler Adult Intelligence Scale the patient showed significant deficits with regard to auditory encoding capacity and ability to actively  process and manipulate information in her active consciousness/register.  The patient did much better on measures of visual encoding on the Wechsler Memory Scale's where she produced a visual working memory index score of 97 which fell at the 42nd percentile and in the average range relative to a normative population.  This pattern does suggest that auditory encoding has been much more impacted than visual encoding capacity  consistent with her left MCA infarction.  Breaking down memory functions between auditory versus visual memory the patient produced an auditory memory index score of 75 which fell at the 5th percentile and in the borderline range relative to a normative population.  This is more than 2 standard deviations below predicted levels of premorbid functioning and clearly indicates and is consistent with subjective reports of significant changes and weaknesses in auditory memory and learning.  The patient did significantly better on visual memory task although she still performed below predicted levels of premorbid functioning.  The patient produced a visual memory index score of 87 which falls at the 19th percentile and is in the low average range.  Break in memory functions down between immediate versus delayed, the patient produced an immediate memory index score of 78 which falls at the 7th percentile and is in the borderline range.  This again is significantly below premorbid predictions of functioning in this area.  More of these deficits or weaknesses are related to initially learning including storage and organization, auditory memory with better performance is generally with regard to visual memory and learning aspects.  The patient produced a delayed memory index score of 80 which falls at the 9th percentile and is in the low average range and this pattern does suggest that if information is able to be encoded and initially stored it is available for later recall.  The patient did  particularly better on recognition and cued recall formats for visual information versus auditory information and it is clear that she is doing much better with regard to storage and organization of visual information likely attributed to much better visual encoding versus auditory encoding.  The patient's performance did not particularly improve for recognition/cued recall for auditory information.            Primary Subtest Scaled Score Summary     Subtest Domain Raw Score Scaled Score Percentile Rank  Logical Memory I AM 15 5 5   Logical Memory II AM 13 6 9   Verbal Paired Associates I AM 17 4 2   Verbal Paired Associates II AM 10 8 25   Designs I VM 80 9 37  Designs II VM 59 8 25  Visual Reproduction I VM 38 9 37  Visual Reproduction II VM 19 6 9   Spatial Addition VWM 18 11 63  Symbol Span VWM 23 8 25                 Auditory Memory Process Score Summary      Process Score Raw Score Scaled Score Percentile Rank Cumulative Percentage (Base Rate)  LM II Recognition 19 - - 3-9%  VPA II Recognition 38 - - 17-25%             Visual Memory Process Score Summary      Process Score Raw Score Scaled Score Percentile Rank Cumulative Percentage (Base Rate)  DE I Content 40 10 50 -  DE I Spatial 16 7 16  -  DE II Content 40 11 63 -  DE II Spatial 11 7 16  -  DE II Recognition 20 - - >75%  VR II Recognition 6 - - 26-50%        ABILITY-MEMORY ANALYSIS   Ability Score:    VCI: 95 Date of Testing:           WAIS-IV; WMS-IV 2021/11/16             Predicted Difference Method    Index Predicted WMS-IV Index  Score Actual WMS-IV Index Score Difference Critical Value   Significant Difference Y/N Base Rate  Auditory Memory 97 75 22 9.36 Y 5%  Visual Memory 98 87 11 7.48 Y 20-25%  Visual Working Memory 97 97 0 11.49 N    Immediate Memory 97 78 19 9.45 Y 5-10%  Delayed Memory 97 80 17 9.58 Y 10%  Statistical significance (critical value) at the .01 level.       We also calculated  the patient's ability/memory analysis in which we took the patient's verbal comprehension index score from the Wechsler Adult Intelligence Scale of 95 to produce a predicted score on various memory and learning components and compare that predicted score versus the actual achieved score.  While the patient performed consistent with predicted performances using this methodology for her visual working memory and visual encoding capacity there were significant weaknesses and deficits identified for auditory memory, visual memory as well as both immediate and delayed memory functions in this comparison.  This is consistent with previous descriptions of significant auditory encoding deficits and greater auditory memory deficits for storage and organization but still observed and objective findings of weaker visual memory.  The patient did improve significantly for visual encoding, visual storage and organization and her performance improved under recognition/cued recall for visual memory while not improving for auditory memory.  The patient was then administered the comprehensive attention battery as well as the CAB CPT visual monitor continuous performance measure.  This allows for a broad assessment of multiple aspects or factors of attention and concentration.  On the initial measure which was a pure auditory and visual reaction time measures the patient performed generally well for pure reaction time measures.  On the visual pure reaction time measure she correctly identified 45 of 50 targets was 5 errors of omission.  While this is somewhat below normative expectations this was the very first measure and looking at raw data suggest that this was simply an artifact of her getting used to the user interface/input of a touch screen.  Her average response time was quite good averaging 272 ms.  On the pure auditory reaction time measure the patient correctly identified 50 of 50 targets which is within normative  expectations.  Her average response time of 488 ms was also within normal limits.  There were no indications of global arousal deficits are quickly processing pure auditory or visual information.  The patient was then administered the discriminate reaction time test.  The patient correctly identified 35 of 35 targets on the visual discriminate reaction time measure with no errors of omission and no errors of commission.  This is well within normative expectations.  Her average response time of 399 ms was also within normative expectations.  On the auditory discriminate reaction time measure she correctly identified 35 of 35 targets with 2 errors of commission and no errors of omission.  This is within normative expectations.  Her average response time was 714 ms which is also within normative expectations.  On the shift discriminate reaction time measure the patient correctly identified 29 of 30 targets with 0 errors of commission and 1 error of omission.  Her average response time was 688 ms was also within normative expectations.  There did not appear to be any significant deficits with regard to pure focus execute type attentional variables and the patient was able to effectively shift between changing target stimuli.  The patient was then administered the auditory/visual scan reaction time measures.  Her performance on  these measures were also similar with effective accuracy scores for visual auditory and shifting measures and information processing speed/response times were also within normative expectation for visual and auditory processing times.  The patient was then administered the auditory/visual encoding measures which are similar to measures administered on the Wechsler Adult Intelligence Scale and Wechsler Memory Scale.  The patient showed an identical pattern of significant deficits and weaknesses for auditory encoding capacity while she did quite well and equal to or above normative comparisons  for visual encoding measures.  This increases confidence that the patient is having significant auditory encoding deficits with generally mild or well-maintained visual encoding capacity.  The patient was then administered the Stroop interference cancellation test.  This measure initially starts out with visual scanning and visual processing of information and a focus execute type format and then shifts to targeted distractors with similar processing demands.  On the first 4 9 interference trials the patient correctly scanned and identified 7-10 of the 18 targets for each of the 4 series.  When these task or shift to targeted interference the patient's performance stayed consistent with the 9 targeted interference performance and the patient did not appear to show any significant distractibility to external stimuli but consistent findings of difficulties with regard to focus execute abilities and visual scanning and searching capacity.  Again, this pattern is consistent with those identified on the Wechsler Adult Intelligence Scale and raises her confidence for those performance indicators.  Finally, the patient was administered the visual monitor CPT measure.  This is a continuous performance discriminate reaction time tight measure that extends over a 15-minute period of time.  For analysis, this measures broken down into five 3-minute blocks of time.  On the first 3 minutes of this discriminate reaction time measure the patient correctly identified 29 of 30 targets with only 1 error of omission and 1 error of commission.  Her average response time was 358 ms both of which are within normative expectations.  However, as this measure progressed with time she showed a consistent and progressive increase in both response times as well as errors of omission.  By the 9 to 12-minute epic of time she had 6 errors of omission and had increased her average response time from 358 ms to over 500 ms.  Overall, this  pattern suggest significant weakness with regard to sustaining attention and increasing errors of omission.  This would objectively indicate deficits with regard to sustained attention and concentration.  Summary of Results:   Overall, the objective assessment of a wide range of cognitive domains shows a consistent and clear pattern of cognitive weaknesses and deficits that are consistent with both the cerebrovascular accident and brain regions impacted as well as subjective symptoms reported by the patient and her family regarding changes.  Consistent with subjective reports the patient shows weaknesses and deficits for both somatic fluency as well as lexicon fluency.  These deficits do suggest that she has made improvements with expressive language functioning through therapy but does continue to have objective findings of reduced verbal fluency and expressive language capacity.  There were significant reductions or differences between global intellectual and cognitive functioning versus predicted levels of premorbid functioning.  These were identified and areas of verbal reasoning and problem-solving, reduction in general fund of information or availability or ability to pull out previously learned information, significant reduction in visual reasoning and problem-solving with well retained visual organization and visual construction capacity.  There were significant deficits in auditory encoding  and ability to process active registers with only mild apparent reduction in visual encoding capacity.  There were mild but consistent findings through numerous measures of reduced focus execute abilities and reduced visual scanning and visual processing of information.  Significant reductions in memory and learning for both auditory and visual capacity were noted but these were much more significant for auditory memory and learning.  While visual memory and learning was below predicted levels her weaknesses were much  more for auditory learning and memory.  Consistent with significant greater deficits for auditory encoding capacity the patient had difficulty with auditory encoding, effectively storing and organizing new auditory information and she did not show particular improvements under cueing or recognition challenges.  While the patient showed much better visual encoding capacity she did show some weakness for storage and organization of newly learned or experienced visual information.  However, given the fact that her performance significantly improved under recognition and cueing formats it does suggest that her visual deficits were more to do with weaknesses for retrieval of information with significant improvement under recognition formats.  Attentional variables were generally quite good on most measures and the patient was able to inhibit impulsive responses and perform well on executive functioning measures as well as maintain attention and focus for brief periods of time.  The primary attentional weakness identified was not related to particular increased distractibility or vulnerabilities with external distractors but with regard to significant weaknesses with regard to auditory encoding capacity and deficits and weaknesses with regard to sustaining attention with slowed information processing speed and increasing errors of omission as a function of time.  Impression/Diagnosis:   As far as diagnostic considerations, the patient's resulting objective cognitive performances and findings are quite consistent with locations of infarction identified with MRI scans.  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  There was also right PCA vascular involvement impacting right frontal and right cerebellar regions.  The patient and her mother  describe symptoms including difficulties with short-term memory and changes in problem-solving and executive functioning.  Attention and concentration issues are reported and difficulties with the patient understanding class instructions and difficulty with easy distraction and trouble getting back on task after she is distracted.  The patient describes significant reduction in comprehension abilities as well.  While I will not repeat the specific cognitive deficits objectively assessed on the current evaluation they are laid out in the section just above (summary of results).  The cortical and cerebellar regions impacted by her cerebrovascular event are quite consistent with those objectively found on the current assessment.  The patient is having some continued reduction in expressive language functioning when in targeted/challenge settings but behaviorally she is able to effectively express herself.  This would be consistent with more left temporal involvement versus left frontal involvement.  Changes and reasoning and problem-solving, increased distractibility and difficulties with comprehension of information as well as changes in auditory memory greater than visual memory functions would be consistent with left temporal/parietal involvement and right frontal lobe involvement.  The patient did not describe any particular changes in coordination or motor functioning suggesting improvements with regard to cerebellar involvement.  As far as recommendations, the patient has attempted to return to school but had significant difficulties relative to her previous performance during her freshman year during the fall semester.  She is returning for the spring  semester at [employer].  As far as recommendations for adjustments with regard to academic variables the patient is clearly having difficulties with sustaining attention and difficulty comprehending information in the classroom setting.  As the patient's  visual memory and learning is much more well-preserved and her visual encoding capacity is much more effective than her auditory encoding every effort to provide information visually over auditorily would be quite helpful.  If teachers have PowerPoint presentations or other visual presentations presented during class and any written instruction available it would be quite helpful to make those available to the patient either during or after class.  Because of the patient's difficulties with sustaining attention allowing her to record class presentations may also be quite helpful so she would be able to review them in a broken up review session versus typical hour and a half like classroom settings.  The patient should clearly pay close attention to syllabus and try to review classed materials that will be presented actively in class ahead of time is much as possible.  Pre-reading classroom assignments before presentations will likely help with her encoding difficulties.  Rather than trying to take notes of information during classroom settings reading syllabus information prior would likely be quite helpful to gain comprehension and understanding of information prior to classroom presentations will help her with comprehension during class.  Testing situations would likely be improved if she is able to take test in a more quiet isolated setting and the patient should be given more time to complete test assignments due to difficulties with sustaining attention and concentration.  Therefore specific adjustments within the academic setting around increased test taking time and being able to take her test and then isolated environment would be quite helpful.  Increase time for completing these endeavors would also be recommended.  Specific recommendations for the patient would be paying increased and focused attention to the syllabus to prepare for classroom education by making sure that she reads any book chapters or  presentations ahead of time to have a foundation and familiarity of information before the active classroom presentation.  Reviewing classroom information as soon as she can after will also likely be quite helpful to her improve her memory and understanding of these presentations.  The patient will clearly have a harder time in academic settings than she would have had prior to her cerebrovascular accident but the patient appears to be motivated to complete her college education and she is still within the window of continued neurological improvement given her gender, age and overall health status.  As well as other practical recommendations the patient should clearly continue to follow recommendations of her neurologist.  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  The patient has had no indications of PE or DVTs but has now had a cerebrovascular event.  She was taking birth controls as well as vaping tobacco products and using marijuana.  These are factors that can increase risk of stroke risk in and of themselves but combining these risk factors with her genetic risk factors should be paid attention to.  As of my clinical interview the patient had discontinued some of these risk factors and I would encourage her to address these issues actively with her neurologist and treating PCP.  The patient does not have particularly good sleep  patterns and I would encourage her to work on sleep hygiene issues as those will be not only important for her day-to-day functioning but also important during the neurological recovery timeframe.  The patient should also try to improve her dietary patterns as things like vegetables and whole grains and nuts and seeds will be important for providing the needed nutritional components for her  neurological improvement and long-term health status.  The patient should also engage in regular sustained physical activity which I think she is already achieving to some degree but she should continue to maintain a healthy physical lifestyle.  Given her previous cerebrovascular event she again should pay attention and work on improved sleep hygiene and sleep patterns, dietary patterns as well as sustained physical activity going forward.  The patient will have more challenges completing her academic work as they are clear residual cognitive changes from her cerebrovascular event.  If the patient or the school have any specific questions about recommendations beyond those I gave regarding academic efforts I remain available to address specific questions or concerns.  Diagnosis:    Late effects of cerebrovascular accident  Cognitive deficits  Memory difficulties   _____________________ Arley Phenix, Psy.D. Clinical Neuropsychologist

## 2022-01-13 ENCOUNTER — Ambulatory Visit: Payer: BC Managed Care – PPO | Admitting: Psychology

## 2022-02-17 ENCOUNTER — Ambulatory Visit: Payer: BC Managed Care – PPO | Admitting: Neurology

## 2022-03-31 ENCOUNTER — Ambulatory Visit: Payer: BC Managed Care – PPO | Admitting: Neurology

## 2022-04-04 ENCOUNTER — Encounter: Payer: BC Managed Care – PPO | Admitting: Psychology

## 2022-05-11 ENCOUNTER — Ambulatory Visit: Payer: BC Managed Care – PPO | Admitting: Psychology

## 2022-05-16 ENCOUNTER — Telehealth: Payer: Self-pay | Admitting: Neurology

## 2022-05-16 NOTE — Telephone Encounter (Signed)
LVM and mychart msg informing pt of need to reschedule 9/6 appointment - MD out 

## 2022-05-25 ENCOUNTER — Encounter: Payer: BC Managed Care – PPO | Attending: Psychology | Admitting: Psychology

## 2022-05-25 DIAGNOSIS — R4189 Other symptoms and signs involving cognitive functions and awareness: Secondary | ICD-10-CM | POA: Diagnosis not present

## 2022-05-25 DIAGNOSIS — R413 Other amnesia: Secondary | ICD-10-CM | POA: Insufficient documentation

## 2022-05-25 DIAGNOSIS — I6990 Unspecified sequelae of unspecified cerebrovascular disease: Secondary | ICD-10-CM | POA: Diagnosis not present

## 2022-05-31 ENCOUNTER — Encounter: Payer: Self-pay | Admitting: Psychology

## 2022-05-31 NOTE — Progress Notes (Signed)
05/25/2022 4 PM-5 PM:  Today's visit was an in person visit.  It was conducted in my outpatient clinic office.  Today I provided feedback regarding the recent neuropsychological evaluation following a referral for this evaluation by the patient's treating neurologist.  I have included a copy of the impression/summary below for convenience.  This evaluation can be found in its entirety in the patient's EMR dated 11/24/2021.    Impression/Diagnosis:                     As far as diagnostic considerations, the patient's resulting objective cognitive performances and findings are quite consistent with locations of infarction identified with MRI scans.  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  There was also right PCA vascular involvement impacting right frontal and right cerebellar regions.  The patient and her mother describe symptoms including difficulties with short-term memory and changes in problem-solving and executive functioning.  Attention and concentration issues are reported and difficulties with the patient understanding class instructions and difficulty with easy distraction and trouble getting back on task after she is distracted.  The patient describes significant reduction in comprehension abilities as well.  While I will not repeat the specific cognitive deficits objectively assessed on the current evaluation they are laid out in the section just above (summary of results).  The cortical and cerebellar regions impacted by her cerebrovascular event are quite consistent with those objectively found on the current assessment.  The patient is having some continued reduction in expressive language functioning when in targeted/challenge settings but behaviorally she is able to effectively express herself.  This would be  consistent with more left temporal involvement versus left frontal involvement.  Changes and reasoning and problem-solving, increased distractibility and difficulties with comprehension of information as well as changes in auditory memory greater than visual memory functions would be consistent with left temporal/parietal involvement and right frontal lobe involvement.  The patient did not describe any particular changes in coordination or motor functioning suggesting improvements with regard to cerebellar involvement.  As far as recommendations, the patient has attempted to return to school but had significant difficulties relative to her previous performance during her freshman year during the fall semester.  She is returning for the spring semester at [employer].  As far as recommendations for adjustments with regard to academic variables the patient is clearly having difficulties with sustaining attention and difficulty comprehending information in the classroom setting.  As the patient's visual memory and learning is much more well-preserved and her visual encoding capacity is much more effective than her auditory encoding every effort to provide information visually over auditorily would be quite helpful.  If teachers have PowerPoint presentations or other visual presentations presented during class and any written instruction available it would be quite helpful to make those available to the patient either during or after class.  Because of the patient's difficulties with sustaining attention allowing her to record class presentations may also be quite helpful so she would be able to review them in a broken up review session versus typical hour and a half like classroom settings.  The patient should clearly pay close attention to syllabus and try to review classed materials that will be presented actively in class ahead of time is much as possible.  Pre-reading classroom assignments before  presentations will likely help with  her encoding difficulties.  Rather than trying to take notes of information during classroom settings reading syllabus information prior would likely be quite helpful to gain comprehension and understanding of information prior to classroom presentations will help her with comprehension during class.  Testing situations would likely be improved if she is able to take test in a more quiet isolated setting and the patient should be given more time to complete test assignments due to difficulties with sustaining attention and concentration.  Therefore specific adjustments within the academic setting around increased test taking time and being able to take her test and then isolated environment would be quite helpful.  Increase time for completing these endeavors would also be recommended.  Specific recommendations for the patient would be paying increased and focused attention to the syllabus to prepare for classroom education by making sure that she reads any book chapters or presentations ahead of time to have a foundation and familiarity of information before the active classroom presentation.  Reviewing classroom information as soon as she can after will also likely be quite helpful to her improve her memory and understanding of these presentations.  The patient will clearly have a harder time in academic settings than she would have had prior to her cerebrovascular accident but the patient appears to be motivated to complete her college education and she is still within the window of continued neurological improvement given her gender, age and overall health status.  As well as other practical recommendations the patient should clearly continue to follow recommendations of her neurologist.  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  The patient has had no indications of PE or DVTs but has now had a cerebrovascular event.  She was taking birth controls as well as vaping tobacco products and using marijuana.  These are factors that can increase risk of stroke risk in and of themselves but combining these risk factors with her genetic risk factors should be paid attention to.  As of my clinical interview the patient had discontinued some of these risk factors and I would encourage her to address these issues actively with her neurologist and treating PCP.  The patient does not have particularly good sleep patterns and I would encourage her to work on sleep hygiene issues as those will be not only important for her day-to-day functioning but also important during the neurological recovery timeframe.  The patient should also try to improve her dietary patterns as things like vegetables and whole grains and nuts and seeds will be important for providing the needed nutritional components for her neurological improvement and long-term health status.  The patient should also engage in regular sustained physical activity which I think she is already achieving to some degree but she should continue to maintain a healthy physical lifestyle.  Given her previous cerebrovascular event she again should pay attention and work on improved sleep hygiene and sleep patterns, dietary patterns as well as sustained physical activity going forward.  The patient will have more challenges completing her academic work as they are clear residual cognitive changes from her cerebrovascular event.  If the patient or the school have any specific questions about recommendations beyond those I gave regarding academic efforts I remain available to address specific questions or concerns.   Diagnosis:  Late effects of cerebrovascular accident   Cognitive deficits   Memory difficulties      _____________________ Arley Phenix, Psy.D. Clinical Neuropsychologist

## 2022-06-16 ENCOUNTER — Ambulatory Visit: Payer: BC Managed Care – PPO | Admitting: Psychology

## 2022-07-20 ENCOUNTER — Ambulatory Visit: Payer: BC Managed Care – PPO | Admitting: Neurology

## 2022-10-27 ENCOUNTER — Ambulatory Visit: Payer: BC Managed Care – PPO | Admitting: Neurology

## 2023-01-03 IMAGING — XA IR CAROTID INTERNAL HEAD/NECK BILAT  (MS)
12 of 21 series · 12 of 24 positions shown · non-contrast
Comparison: MR angiogram April 12, 2021

INDICATION: Saibu Katherine is a 19 year old female with a past medical history
significant for asthma and anxiety. She presented to outside
hospital on 04/11/2021 with speech difficulty. She was transferred
and admitted to [REDACTED] for stroke work-up. Further imaging revealed
acute multifocal CVAs (largest in the left MCA territory) concerning
for embolic event versus vasculitis. A diagnostic cerebral angiogram
was requested to evaluate cerebral vasculature.

EXAM:
ULTRASOUND-GUIDED VASCULAR [REDACTED] CEREBRAL ANGIOGRAM
TECHNIQUE: Informed written consent was obtained from the patient and her
mother after a thorough discussion of the procedural risks, benefits
and alternatives. All questions were addressed. Maximal Sterile
Barrier Technique was utilized including caps, mask, sterile gowns,
sterile gloves, sterile drape, hand hygiene and skin antiseptic. A
timeout was performed prior to the initiation of the procedure.

[Series 1: fl neuro · 1 of 10 frames shown]
[frame 9/10]
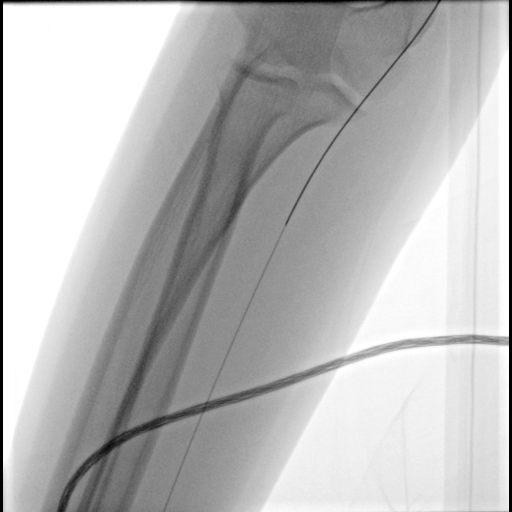

[Series 3: n roadmap · 1 of 38 frames shown]
[frame 33/38]
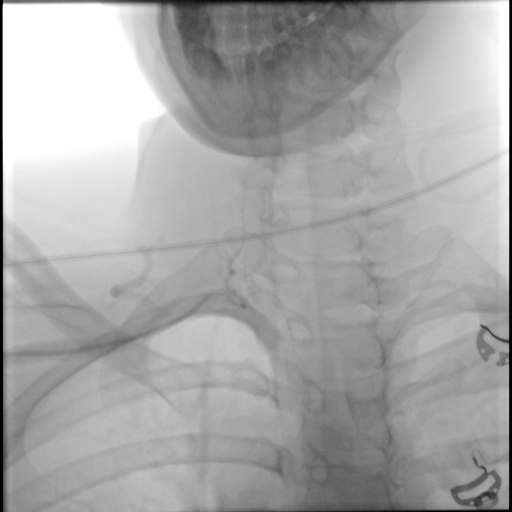

[Series 5: fl neuro n · 1 of 8 frames shown (1 of 4)]
[frame 2/8]
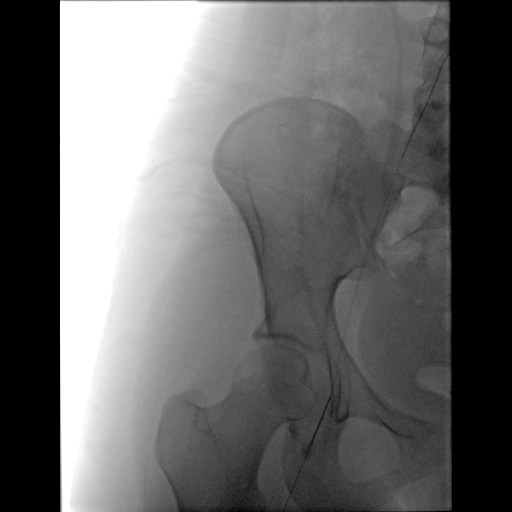

[Series 7: cerebral care 2 · 2 acquisitions, 1 frame shown (1 of 5)]
[im 1/2]
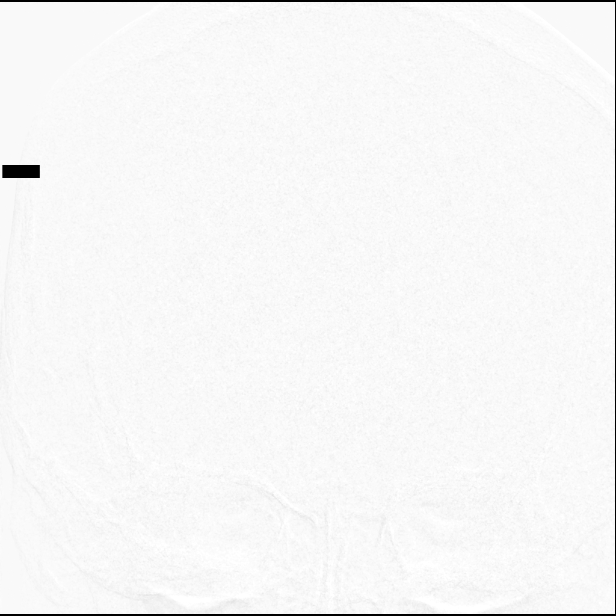

[Series 8: cerebral care 2 · 2 acquisitions, 1 frame shown (2 of 5)]
[im 1/2]
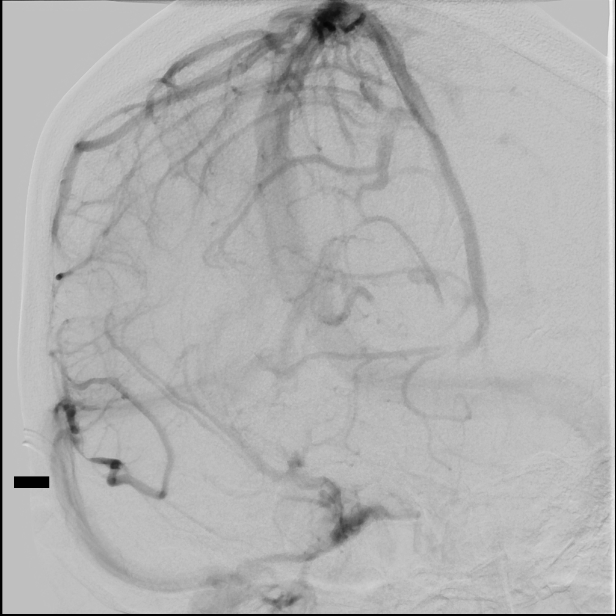

[Series 10: fl neuro n · 1 of 106 frames shown (2 of 4)]
[frame 80/106]
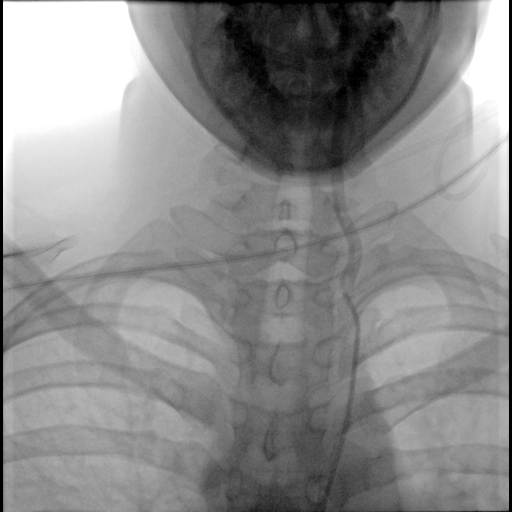

[Series 12: cerebral care 2 · 2 acquisitions, 1 frame shown (3 of 5)]
[im 1/2]
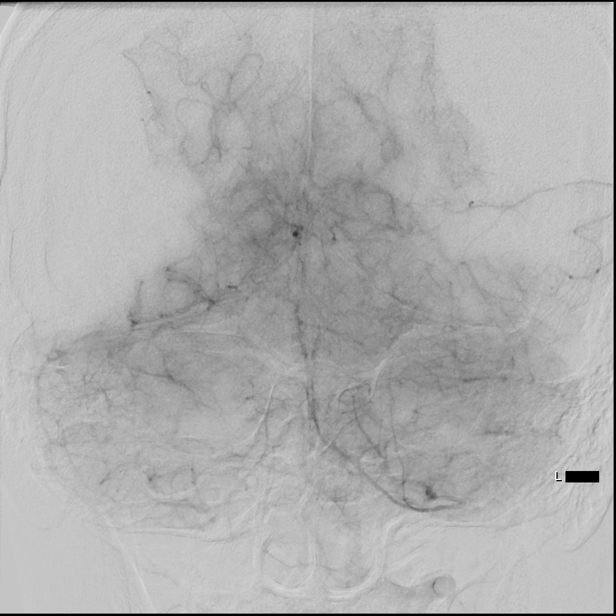

[Series 14: cerebral care 2 · 2 acquisitions, 1 frame shown (4 of 5)]
[im 1/2]
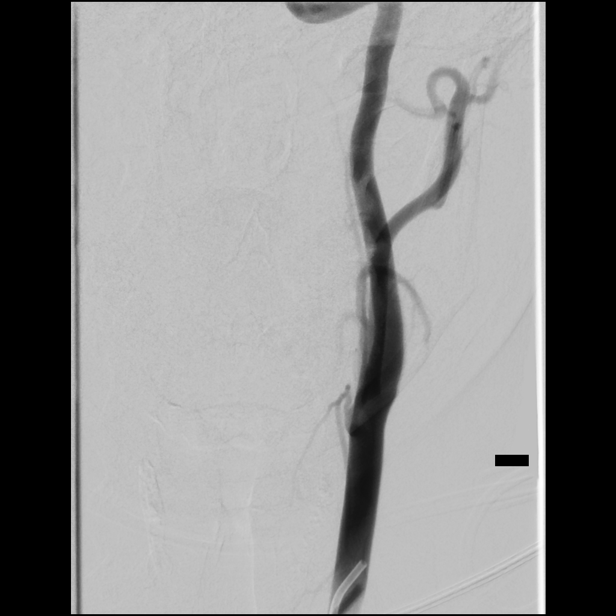

[Series 16: cerebral care 2 · 2 acquisitions, 1 frame shown (5 of 5)]
[im 1/2]
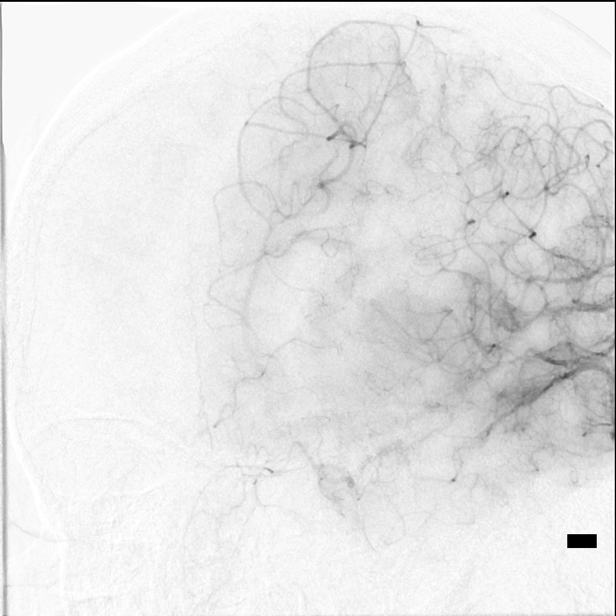

[Series 18: fl neuro n · 1 of 15 frames shown (3 of 4)]
[frame 3/15]
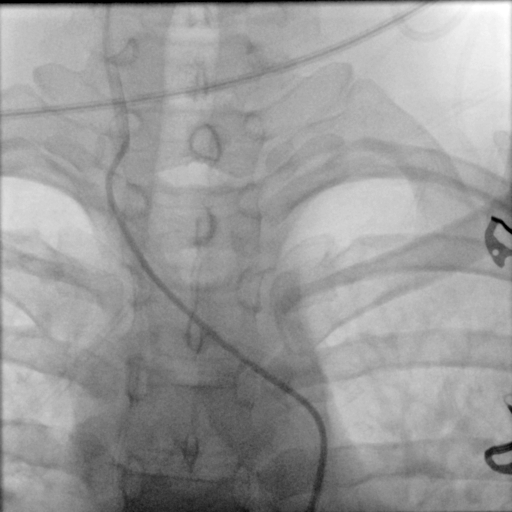

[Series 20: fl neuro n · 1 of 33 frames shown (4 of 4)]
[frame 2/33]
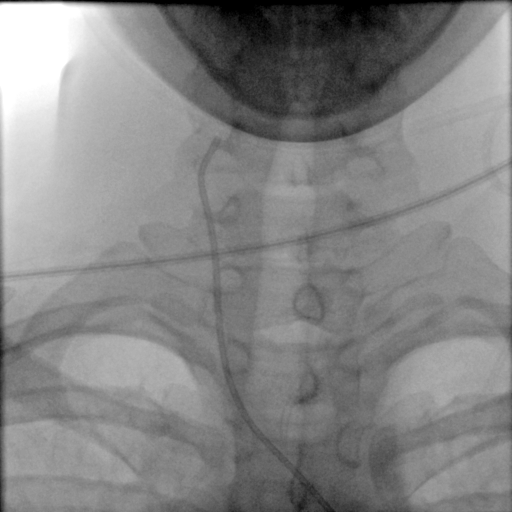

[Series 300: ir angio intra extracran sel com carotid · 1 of 31 slices shown]
[im 31/31]
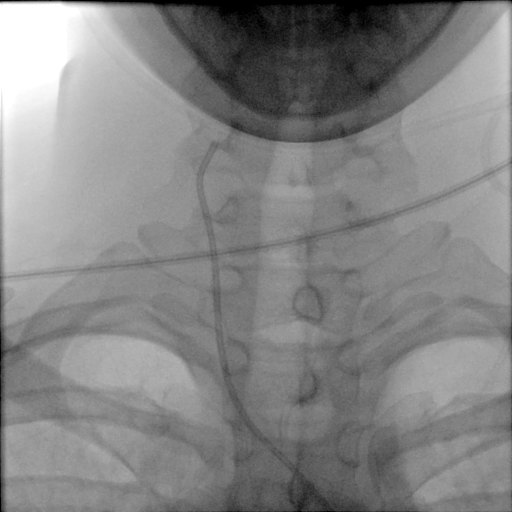

[12 of 24 positions shown; findings below may reference images not displayed]

MEDICATIONS:
None.

ANESTHESIA/SEDATION:
Versed 1 mg IV; Fentanyl 50 mcg IV

Moderate Sedation Time:  1 hour and 16 minutes

The patient was continuously monitored during the procedure by the
interventional radiology nurse under my direct supervision.

CONTRAST:  75 mL of Omnipaque 240 milligrams/mL

FLUOROSCOPY TIME:  Fluoroscopy Time: 13 minutes 36 seconds (281.6
mGy).

COMPLICATIONS:
None immediate.
Using the modified Seldinger technique and a micropuncture kit,
access was gained to the distal right radial artery at the
anatomical snuffbox and a 5 French sheath was placed. Real-time
ultrasound guidance was utilized for vascular access including the
acquisition of a permanent ultrasound image documenting patency of
the accessed vessel. Slow intra arterial infusion of 5,000 NNONYE
heparin, 5 mg Verapamil and 200 mcg nitroglicerin diluted in
patient's own blood was performed. No significant fluctuation in
patient's blood pressure seen. Then, a right radial artery roadmap
was obtained via sheath side port. Normal brachial artery branching
pattern seen. No significant anatomical variation. The right radial
artery caliber is adequate for vascular access.

Next, a 5 Galindo Auguste 2 glide catheter was navigated over a
0.035" Terumo Glidewire into the right subclavian artery under
fluoroscopic guidance. Frontal right subclavian artery angiogram was
obtained. Due to inability to access the aortic arch from the
aberrant right subclavian artery, decision was made to proceed with
femoral access.

The right groin was prepped and draped in the usual sterile fashion.
Using a micropuncture kit and the modified Seldinger technique,
access was gained to the right common femoral artery and a 5 French
sheath was placed.

Under fluoroscopy, a 5 Hucen Viruxana 2 catheter was navigated
over a 0.035" Terumo Glidewire into the aortic arch. The catheter
was placed into the right common carotid artery. Frontal and lateral
angiograms of the neck were obtained. Under biplane roadmap, the
catheter was advanced into the right internal carotid artery.
Frontal and lateral angiograms of the head were obtained followed by
right anterior oblique and magnified lateral views of the head. The
catheter was then retracted into the right common carotid artery and
under fluoroscopy advanced into the right external carotid artery.
Frontal and lateral angiograms of the head were obtained.

The catheter was then placed into the left subclavian artery and
advanced into the left vertebral artery. Frontal, lateral waters and
magnified lateral views of the head were obtained.

The catheter was then advanced into the left common carotid artery.
Frontal and lateral views of the neck were obtained. Under biplane
roadmap, the catheter was advanced into the left internal carotid
artery. Frontal, lateral, magnified left anterior oblique and
magnified lateral views of the head were obtained. The catheter was
then retracted and advanced into the left external carotid artery.
Frontal and lateral angiograms of the head were obtained.

The catheter was subsequently advanced into the right vertebral
artery. Frontal and lateral views of the head were obtained.

The catheter was subsequently withdrawn.

A 5 French Perclose was utilized for right femoral access closure.
Hemostasis was achieved after approximately 5 minutes of manual
pressure hold.

An inflatable band was placed and inflated over the right hand
access site. The vascular sheath was withdrawn and the band was
slowly deflated until brisk flow was noted through the arteriotomy
site. At this point, the band was reinflated with additional 2 cc of
air to obtain patent hemostasis.
FINDINGS: Right radial artery ultrasound and right radial artery angiogram:
The caliber of the distal right radial artery is appropriate for
angiogram access. The right radial artery and the right ulnar artery
have normal course and caliber. No significant anatomical variants
noted.

Right subclavian angiograms: Aberrant right subclavian artery is
noted. No opacification of the right vertebral artery. Otherwise,
the visualized right subclavian artery and visualized branches of
the thyrocervical trunk are unremarkable.

Right common femoral artery ultrasound: Normal course and caliber of
the right common femoral artery.

Right CCA angiograms: Cervical angiograms show normal course and
caliber of the visualized right common carotid and internal carotid
arteries. There are no significant stenoses.

Right ICA angiograms: There is brisk vascular contrast filling of
the ACA and MCA vascular trees. Luminal caliber is smooth and
tapering. No aneurysms or abnormally high-flow, early draining veins
are seen. No regions of abnormal hypervascularity are noted. The
visualized dural sinuses are patent.

Right ECA and right occipital angiograms: No early venous drainage
was noted. The intracranial branches of the right external carotid
artery are unremarkable.

Left vertebral artery angiograms: The left vertebral artery, basilar
artery, and bilateral posterior cerebral arteries are unremarkable.
Small infundibulum at the origin of a thalami perforator branch from
the left P1/PCA is incidentally noted. Luminal caliber is smooth and
tapering. No aneurysms or abnormally high-flow, early draining veins
are seen. No regions of abnormal hypervascularity are noted. The
visualized dural sinuses are patent.

Left CCA angiograms: Cervical angiograms show normal course and
caliber of the visualized left common carotid and internal carotid
arteries. There are no significant stenoses.

Left ICA angiograms: There is brisk vascular contrast filling of the
the ACA and MCA vascular trees. Luminal caliber is smooth and
tapering. No aneurysms or abnormally high-flow, early draining veins
are seen. No regions of abnormal hypervascularity are noted. The
visualized dural sinuses are patent.

Left ECA angiograms: No early venous drainage was noted. The
intracranial branches of the left external carotid artery are
unremarkable.

Right vertebral artery angiograms: The right vertebral artery
originates from the right common carotid artery. The right vertebral
artery, basilar artery, and bilateral posterior cerebral arteries
are unremarkable. Luminal caliber is smooth and tapering. No
aneurysms or abnormally high-flow, early draining veins are seen. No
regions of abnormal hypervascularity are noted. The visualized dural
sinuses are patent.

PROCEDURE:
Not applicable.
IMPRESSION: No evidence of luminal irregularity, hemodynamically significant
stenosis, occlusion or other significant vascular abnormality to
explain patient's recent cerebral infarcts. No aneurysm, AVM or
dural AV fistula.

Incidental note made of aberrant right subclavian artery with right
vertebral artery originating from the right common carotid artery.

PLAN:
Results communicated to Torjanac. Ruti via secure text paging immediately
after the angiogram was finalized.

## 2023-06-06 ENCOUNTER — Telehealth: Payer: Self-pay | Admitting: Neurology

## 2023-06-06 ENCOUNTER — Encounter: Payer: Self-pay | Admitting: Neurology

## 2023-06-06 NOTE — Telephone Encounter (Signed)
LVM and sent letter in mail informing pt of need to reschedule 07/04/23 appt - MD out

## 2023-07-04 ENCOUNTER — Institutional Professional Consult (permissible substitution): Payer: BC Managed Care – PPO | Admitting: Neurology
